# Patient Record
Sex: Male | Born: 1965
Health system: Southern US, Community
[De-identification: ages and names within clinical notes are randomized; demographics above are authoritative.]

## PROBLEM LIST (undated history)

## (undated) DIAGNOSIS — Z8739 Personal history of other diseases of the musculoskeletal system and connective tissue: Secondary | ICD-10-CM

## (undated) DIAGNOSIS — N289 Disorder of kidney and ureter, unspecified: Secondary | ICD-10-CM

## (undated) DIAGNOSIS — I10 Essential (primary) hypertension: Secondary | ICD-10-CM

## (undated) DIAGNOSIS — E119 Type 2 diabetes mellitus without complications: Secondary | ICD-10-CM

## (undated) DIAGNOSIS — R569 Unspecified convulsions: Secondary | ICD-10-CM

## (undated) DIAGNOSIS — D649 Anemia, unspecified: Secondary | ICD-10-CM

## (undated) DIAGNOSIS — Z940 Kidney transplant status: Secondary | ICD-10-CM

## (undated) HISTORY — PX: AV FISTULA PLACEMENT: SHX1204

## (undated) HISTORY — PX: NEPHRECTOMY TRANSPLANTED ORGAN: SUR880

---

## 1998-04-03 ENCOUNTER — Encounter (HOSPITAL_COMMUNITY): Admission: RE | Admit: 1998-04-03 | Discharge: 1998-07-02 | Payer: Self-pay

## 1999-09-25 ENCOUNTER — Encounter: Payer: Self-pay | Admitting: Nephrology

## 1999-09-25 ENCOUNTER — Encounter: Admission: RE | Admit: 1999-09-25 | Discharge: 1999-09-25 | Payer: Self-pay | Admitting: Nephrology

## 2001-09-24 ENCOUNTER — Inpatient Hospital Stay (HOSPITAL_COMMUNITY): Admission: EM | Admit: 2001-09-24 | Discharge: 2001-09-26 | Payer: Self-pay | Admitting: Emergency Medicine

## 2001-09-24 ENCOUNTER — Encounter: Payer: Self-pay | Admitting: Emergency Medicine

## 2003-03-26 ENCOUNTER — Encounter (HOSPITAL_COMMUNITY): Admission: RE | Admit: 2003-03-26 | Discharge: 2003-06-24 | Payer: Self-pay | Admitting: Surgery

## 2003-05-28 ENCOUNTER — Encounter (INDEPENDENT_AMBULATORY_CARE_PROVIDER_SITE_OTHER): Payer: Self-pay | Admitting: Specialist

## 2003-05-28 ENCOUNTER — Ambulatory Visit (HOSPITAL_COMMUNITY): Admission: RE | Admit: 2003-05-28 | Discharge: 2003-05-29 | Payer: Self-pay | Admitting: Surgery

## 2003-06-02 ENCOUNTER — Emergency Department (HOSPITAL_COMMUNITY): Admission: EM | Admit: 2003-06-02 | Discharge: 2003-06-02 | Payer: Self-pay | Admitting: Emergency Medicine

## 2003-11-03 ENCOUNTER — Emergency Department (HOSPITAL_COMMUNITY): Admission: EM | Admit: 2003-11-03 | Discharge: 2003-11-03 | Payer: Self-pay | Admitting: Family Medicine

## 2008-12-14 ENCOUNTER — Emergency Department (HOSPITAL_COMMUNITY): Admission: EM | Admit: 2008-12-14 | Discharge: 2008-12-14 | Payer: Self-pay | Admitting: Emergency Medicine

## 2010-01-09 ENCOUNTER — Inpatient Hospital Stay (HOSPITAL_COMMUNITY): Admission: EM | Admit: 2010-01-09 | Discharge: 2010-01-10 | Payer: Self-pay | Admitting: Emergency Medicine

## 2010-08-06 LAB — URINALYSIS, ROUTINE W REFLEX MICROSCOPIC
Glucose, UA: NEGATIVE mg/dL
Hgb urine dipstick: NEGATIVE
Ketones, ur: 15 mg/dL — AB
Leukocytes, UA: NEGATIVE
Nitrite: NEGATIVE
Protein, ur: 30 mg/dL — AB
Specific Gravity, Urine: 1.021 (ref 1.005–1.030)
Urobilinogen, UA: 0.2 mg/dL (ref 0.0–1.0)
pH: 5 (ref 5.0–8.0)

## 2010-08-06 LAB — URINE MICROSCOPIC-ADD ON

## 2010-08-06 LAB — GLUCOSE, CAPILLARY
Glucose-Capillary: 151 mg/dL — ABNORMAL HIGH (ref 70–99)
Glucose-Capillary: 198 mg/dL — ABNORMAL HIGH (ref 70–99)
Glucose-Capillary: 300 mg/dL — ABNORMAL HIGH (ref 70–99)

## 2010-08-06 LAB — HEMOGLOBIN A1C: Mean Plasma Glucose: 120 mg/dL — ABNORMAL HIGH (ref <117)

## 2010-08-06 LAB — COMPREHENSIVE METABOLIC PANEL
ALT: 26 U/L (ref 0–53)
AST: 22 U/L (ref 0–37)
Albumin: 3.8 g/dL (ref 3.5–5.2)
Alkaline Phosphatase: 65 U/L (ref 39–117)
BUN: 33 mg/dL — ABNORMAL HIGH (ref 6–23)
CO2: 20 mEq/L (ref 19–32)
Calcium: 8.1 mg/dL — ABNORMAL LOW (ref 8.4–10.5)
Chloride: 109 mEq/L (ref 96–112)
GFR calc Af Amer: 29 mL/min — ABNORMAL LOW (ref >60)
GFR calc non Af Amer: 24 mL/min — ABNORMAL LOW (ref >60)
Glucose, Bld: 132 mg/dL — ABNORMAL HIGH (ref 70–99)
Potassium: 3.9 mEq/L (ref 3.5–5.1)
Potassium: 4 mEq/L (ref 3.5–5.1)
Sodium: 134 mEq/L — ABNORMAL LOW (ref 135–145)
Total Bilirubin: 1.2 mg/dL (ref 0.3–1.2)
Total Protein: 7.7 g/dL (ref 6.0–8.3)

## 2010-08-06 LAB — RAPID URINE DRUG SCREEN, HOSP PERFORMED
Barbiturates: NOT DETECTED
Opiates: NOT DETECTED
Tetrahydrocannabinol: NOT DETECTED

## 2010-08-06 LAB — COMPREHENSIVE METABOLIC PANEL WITH GFR
Albumin: 4.5 g/dL (ref 3.5–5.2)
Alkaline Phosphatase: 75 U/L (ref 39–117)
BUN: 32 mg/dL — ABNORMAL HIGH (ref 6–23)
Chloride: 103 meq/L (ref 96–112)
Creatinine, Ser: 2.91 mg/dL — ABNORMAL HIGH (ref 0.4–1.5)
Glucose, Bld: 169 mg/dL — ABNORMAL HIGH (ref 70–99)
Total Bilirubin: 1.5 mg/dL — ABNORMAL HIGH (ref 0.3–1.2)

## 2010-08-06 LAB — DIFFERENTIAL
Basophils Absolute: 0 K/uL (ref 0.0–0.1)
Basophils Relative: 0 % (ref 0–1)
Eosinophils Absolute: 0.1 10*3/uL (ref 0.0–0.7)
Eosinophils Relative: 1 % (ref 0–5)
Lymphocytes Relative: 15 % (ref 12–46)
Lymphs Abs: 1.8 10*3/uL (ref 0.7–4.0)
Monocytes Absolute: 0.8 K/uL (ref 0.1–1.0)
Monocytes Relative: 6 % (ref 3–12)
Neutro Abs: 9 K/uL — ABNORMAL HIGH (ref 1.7–7.7)
Neutrophils Relative %: 78 % — ABNORMAL HIGH (ref 43–77)

## 2010-08-06 LAB — STOOL CULTURE

## 2010-08-06 LAB — CULTURE, BLOOD (ROUTINE X 2)
Culture: NO GROWTH
Culture: NO GROWTH

## 2010-08-06 LAB — CBC
HCT: 34.2 % — ABNORMAL LOW (ref 39.0–52.0)
HCT: 37.7 % — ABNORMAL LOW (ref 39.0–52.0)
Hemoglobin: 13.5 g/dL (ref 13.0–17.0)
MCH: 30.7 pg (ref 26.0–34.0)
MCHC: 35.8 g/dL (ref 30.0–36.0)
MCV: 84 fL (ref 78.0–100.0)
MCV: 85.7 fL (ref 78.0–100.0)
Platelets: 166 K/uL (ref 150–400)
Platelets: 168 10*3/uL (ref 150–400)
RBC: 4.07 MIL/uL — ABNORMAL LOW (ref 4.22–5.81)
RBC: 4.4 MIL/uL (ref 4.22–5.81)
RDW: 13.9 % (ref 11.5–15.5)
WBC: 11.7 10*3/uL — ABNORMAL HIGH (ref 4.0–10.5)
WBC: 8.4 10*3/uL (ref 4.0–10.5)

## 2010-08-06 LAB — PTH, INTACT AND CALCIUM
Calcium, Total (PTH): 8.3 mg/dL — ABNORMAL LOW (ref 8.4–10.5)
PTH: 58.4 pg/mL (ref 14.0–72.0)

## 2010-08-06 LAB — POCT CARDIAC MARKERS
CKMB, poc: <1 ng/mL — ABNORMAL LOW (ref 1.0–8.0)
Myoglobin, poc: 235 ng/mL (ref 12–200)

## 2010-08-06 LAB — SODIUM, URINE, RANDOM: Sodium, Ur: <10 mEq/L

## 2010-08-06 LAB — RENAL FUNCTION PANEL
CO2: 21 mEq/L (ref 19–32)
Chloride: 111 mEq/L (ref 96–112)
Creatinine, Ser: 1.15 mg/dL (ref 0.4–1.5)
GFR calc Af Amer: >60 mL/min (ref >60)
GFR calc non Af Amer: >60 mL/min (ref >60)
Potassium: 3.8 mEq/L (ref 3.5–5.1)

## 2010-08-06 LAB — CLOSTRIDIUM DIFFICILE EIA

## 2010-08-06 LAB — CARDIAC PANEL(CRET KIN+CKTOT+MB+TROPI)
CK, MB: 1.2 ng/mL (ref 0.3–4.0)
Relative Index: 1.1 (ref 0.0–2.5)
Total CK: 100 U/L (ref 7–232)
Total CK: 105 U/L (ref 7–232)

## 2010-08-06 LAB — TACROLIMUS LEVEL: Tacrolimus (FK506) - LabCorp: 9.5 ng/mL

## 2010-08-06 LAB — GIARDIA/CRYPTOSPORIDIUM SCREEN(EIA)

## 2010-08-06 LAB — TSH: TSH: 0.248 u[IU]/mL — ABNORMAL LOW (ref 0.350–4.500)

## 2010-08-06 LAB — LACTIC ACID, PLASMA: Lactic Acid, Venous: 1.9 mmol/L (ref 0.5–2.2)

## 2010-08-06 LAB — LIPASE, BLOOD: Lipase: 41 U/L (ref 11–59)

## 2010-10-09 NOTE — Op Note (Signed)
Austin Murphy, Austin Murphy                         ACCOUNT NO.:  [id number]   MEDICAL RECORD NO.:  [id number]                   PATIENT TYPE:  OIB   LOCATION:  2550                                 FACILITY:  [facility]   PHYSICIAN:  Earnstine Regal, M.D.                DATE OF BIRTH:  May 14, 1966   DATE OF PROCEDURE:  05/28/2003  DATE OF DISCHARGE:                                 OPERATIVE REPORT   PREOPERATIVE DIAGNOSIS:  Recurrent secondary hyperparathyroidism.   POSTOPERATIVE DIAGNOSIS:  Recurrent secondary hyperparathyroidism.   PROCEDURE:  1. Excise parathyroid tissue from left forearm.  2. Revise scar, left forearm.   SURGEON:  Earnstine Regal, M.D.   ANESTHESIA:  Local with IV sedation.   PREPARATION:  Betadine.   BLOOD LOSS:  Minimal.   COMPLICATIONS:  None.   INDICATIONS:  The patient is a 45 year old black male referred by Dr. Ollen Gross  C. Powell for recurrent secondary hyperparathyroidism.  The patient had  undergone total parathyroidectomy by Dr. Jaci Carrel in November of  1996.  At that time, four enlarged parathyroid glands were removed from the  neck and transplanted into the left forearm.  The patient subsequently  received cadaveric renal transplantation at [facility]  in 1998.  However, the patient has had persistently elevated serum calcium  levels and an elevated intact PTH level.  Sestamibi scan of the neck showed  no sign of ectopic or residual parathyroid tissue in the neck.  The patient  now comes to surgery for excision of parathyroid tissue from left forearm.   BODY OF REPORT:  The procedure is done in OR #1 at the [place].  The patient was brought to the operating room and placed  in supine position on the operating room table.  Following administration of  intravenous sedation, the patient's left forearm is prepped and draped in  the usual strict aseptic fashion.  After ascertaining an adequate level of  sedation had been obtained, the skin was anesthetized with local anesthetic.  A broad linear scar is present on the left forearm; this is excised  circumferentially and discarded.  Dissection was carried down through  subcutaneous tissues of the scar to the muscle fascia.  A fascial plane is  developed.  Prolene sutures remain in place in the muscle fascia,  delineating the location of the transplanted parathyroid tissue.  These  Prolene sutures are excised along with scar tissue and underlying  parathyroid discharge in a thorough exploration of the brachioradialis  muscle.  Both scar and obvious parathyroid tissue is excised.  There is no  grossly hyperplastic glands identified.  Tissue is submitted to Pathology  for review.  Hemostasis is obtained with the electrocautery.  Fascial defect  is too large to close over the muscle belly following the procedure.  Therefore, subcutaneous tissues  were closed with interrupted 3-0 Vicryl sutures.  The  skin was closed with a  running 4-0 Vicryl subcuticular suture.  The wound is washed, dried, and  Benzoin and Steri-Strips are applied.  Sterile gauze dressings are applied.  The patient is brought to the Recovery Room in stable condition.  The  patient tolerated the procedure well.                                               Earnstine Regal, M.D.    TMG/MEDQ  D:  05/28/2003  T:  05/28/2003  Job:  [id number]   cc:   Darrold Span. Florene Glen, M.D.  [address]  [place]  [place] [address]  Fax: [phone]

## 2010-10-09 NOTE — H&P (Signed)
[facility]  Patient:    Austin Murphy, Austin Murphy Visit Number: [id number] MRN: [id number]          Service Type: MED Location: [id number] Attending Physician:  Georgette Shell Dictated by:   Windy Kalata, M.D. Admit Date:  09/23/2001                           History and Physical  REASON FOR ADMISSION:  Cellulitis of the right arm and probable graft infection.  HISTORY OF PRESENT ILLNESS:  This is a 45 year old black male who is status post cadaveric renal transplant at [place] approximately five years ago, who complains of pain in his right arm over an old A-V graft for the last one to two weeks.  He apparently saw Dr. Judeth Cornfield. Scot Dock for this recently with plans for followup this Friday to determine if the graft should be removed.  Today, he noticed increasing pain and erythema over the old scar of his right upper arm and presented to the emergency room; in addition, he complains of some right chest pain, which he describes as crampy in nature, worse with inspiration, since Friday night.  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  He has had no shortness of breath.  PAST MEDICAL HISTORY: 1. End-stage renal disease secondary to hypertension. 2. Status post cadaveric renal transplant approximately five years ago. 3. Hypertension. 4. Secondary hyperparathyroidism, status post parathyroidectomy.  ALLERGIES:  None.  MEDICATIONS: 1. Prograf 8 mg b.i.d. 2. CellCept 1000 mg b.i.d. 3. Prednisone 5 mg a day. 4. Cardura 4 mg a day. 5. Norvasc 10 mg a day. 6. Lasix 80 mg b.i.d.  SOCIAL HISTORY:  Nonsmoker, nondrinker.  He is working part-time with a Development worker, international aid.  FAMILY HISTORY:  Noncontributory.  REVIEW OF SYSTEMS:  No recent change in vision.  No shortness of breath. Chest pain as mentioned above.  No dysuria.  No abdominal pain.  No new neurologic  symptoms.  No new skin lesions.  PHYSICAL EXAMINATION:  VITAL SIGNS:  Blood pressure is 122/80, temperature 101, pulse 84, respirations 16.  GENERAL:  A healthy-appearing 45 year old black male in no acute distress.  HEENT:  Sclerae nonicteric.  Extraocular muscles are intact.  NECK:  No JVD.  LUNGS:  A few crackles in the right base.  HEART:  Regular without murmur, rub or gallop.  ABDOMEN:  Positive bowel sounds.  Nontender, nondistended.  No hepatosplenomegaly.  Transplant in left lower quadrant which is nontender.  EXTREMITIES:  No clubbing, cyanosis, or edema.  CHEST:  I am able to reproduce the right chest pain with palpation on the lateral rib cage.  There are no external lesions or rashes.  NEUROLOGIC:  No focal deficits.  LABORATORY AND ACCESSORY DATA:  White count is 12.8, hemoglobin is 12.8. Potassium is 2.7, creatinine 1.6.  IMPRESSION: 1. Cellulitis of the right arm with probable graft infection. 2. Hypokalemia. 3. Right chest pain which seems more musculoskeletal in nature.  Chest x-ray    is pending at the present time. 4. Hypertension, currently well-controlled.  PLAN: 1. Will admit to the hospital and give IV antibiotics in the way of Ancef 1 g    q.8h. 2. Will check blood cultures. 3. Replace potassium. 4. Will have CVTS see to see if his graft needs to be removed. Dictated by:   Windy Kalata,  M.D. Attending Physician:  Georgette Shell DD:  09/24/01 TD:  09/25/01 Job: [id number]

## 2010-10-09 NOTE — Discharge Summary (Signed)
Austin Murphy. [facility]  Patient:    Murphy, Austin Visit Number: [id number] MRN: [id number]          Service Type: MED Location: [phone] Attending Physician:  Georgette Shell Dictated by:   Alric Seton, P.A.C. Admit Date:  09/23/2001 Discharge Date: 09/26/2001   CC:         Benson Setting, M.D.   Discharge Summary  DISCHARGE DIAGNOSES: 1. Cellulitis right arm, probably secondary to infected graft. 2. Clotted right AV Gore-Tex dialysis graft. 3. Renal transplant hypokalemia. 4. Hypertension. 5. Musculoskeletal pain.  CONSULTATIONS: Benson Setting, M.D.  HISTORY OF PRESENT ILLNESS: [name] is a 45 year old African-American male who is status post renal transplant at [place] approximately five years ago, who complains of pain in his right arm over his old AV Gore-tex graft site for the past two weeks. Apparently, he saw Dr. Deitra Mayo recently with plans for follow-up this coming Friday to determine if the graft should be removed. Today he noticed increasing pain and erythema over the old scar of his right upper arm and presented to the ER. In addition, he complains of some right sided chest pain, which he describes crampy in nature, worse with inspiration since Friday night. 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 He has no shortness of breath.  PHYSICAL EXAMINATION: The remainder of his physical examination is outlined in the admission H&P data.  LABORATORY DATA: White count 12.8, hemoglobin 12.8, potassium 2.7, creatinine 1.6.  HOSPITAL COURSE: The patient was admitted to a private room  on the renal unit due to his transplant status. Blood cultures were done times two and he was empirically placed on Ancef. Dr. Allean Found saw the patient in consultation and agreed with antibiotic treatment for now. If cellulitis worsened, he would consider removing  the graft. On day two, improvement was shown in his right arm. His potassium had been repleted and was up to 3.5. White count was down to 10.2 and temperature was down from 100.1 to 99.0. By day three, the arm continued to show more improvement. His white count was 9.4. CVTS recommended continued conservative therapy with follow-up in the office. He did have some right sided musculoskeletal type of chest pain which was treated with Darvocet. Hemoglobin remained stable throughout hospitalization at 11.9.  DISPOSITION: The patient was able to be discharged home on Sep 26, 2001. He will follow-up with Dr. Allean Found on Oct 06, 2001 at 9:15 and with Dr. Florene Glen at his usual appointment in June. He is to call us if he has any problems with worsening of his right arm swelling, erythema, or fevers.  DISCHARGE MEDICATIONS: 1. Keflex 500 mg one tablet three times daily for two more weeks. 2. Norvasc 10 mg per day. 3. Potassium chloride 40 meq per day. 4. Prograf 0.8 mg b.i.d. 5. Prednisone 5 mg per day. 6. Salsept 250 mg four tablets twice daily. 7. Cardura 4 mg per day. 8. Lasix 80 mg b.i.d. 9. Pepcid 20 mg per day. 10.Darvocet N-100 one tablet q.6h. p.r.n. musculoskeletal chest pain, #15 were    dispensed.  ACTIVITY: As tolerated.  DIET: No added salt.  WOUND CARE: Not applicable. Dictated by:   Alric Seton, P.A.C. Attending Physician:  Georgette Shell DD:  09/27/01 TD:  10/01/01 Job: [id number]

## 2014-07-09 ENCOUNTER — Encounter (HOSPITAL_COMMUNITY): Payer: Self-pay

## 2014-07-09 ENCOUNTER — Observation Stay (HOSPITAL_COMMUNITY)
Admission: EM | Admit: 2014-07-09 | Discharge: 2014-07-10 | Disposition: A | Discharge status: Home / Self Care | Payer: Medicare Other | Attending: Internal Medicine | Admitting: Internal Medicine

## 2014-07-09 DIAGNOSIS — N183 Chronic kidney disease, stage 3 (moderate): Secondary | ICD-10-CM

## 2014-07-09 DIAGNOSIS — N184 Chronic kidney disease, stage 4 (severe): Secondary | ICD-10-CM | POA: Diagnosis present

## 2014-07-09 DIAGNOSIS — E119 Type 2 diabetes mellitus without complications: Secondary | ICD-10-CM | POA: Diagnosis not present

## 2014-07-09 DIAGNOSIS — I951 Orthostatic hypotension: Principal | ICD-10-CM | POA: Diagnosis present

## 2014-07-09 DIAGNOSIS — R55 Syncope and collapse: Secondary | ICD-10-CM | POA: Diagnosis present

## 2014-07-09 DIAGNOSIS — Z87891 Personal history of nicotine dependence: Secondary | ICD-10-CM | POA: Insufficient documentation

## 2014-07-09 DIAGNOSIS — Z7952 Long term (current) use of systemic steroids: Secondary | ICD-10-CM | POA: Diagnosis not present

## 2014-07-09 DIAGNOSIS — Z79899 Other long term (current) drug therapy: Secondary | ICD-10-CM | POA: Insufficient documentation

## 2014-07-09 DIAGNOSIS — I10 Essential (primary) hypertension: Secondary | ICD-10-CM | POA: Diagnosis present

## 2014-07-09 DIAGNOSIS — Z87448 Personal history of other diseases of urinary system: Secondary | ICD-10-CM | POA: Diagnosis not present

## 2014-07-09 DIAGNOSIS — Z940 Kidney transplant status: Secondary | ICD-10-CM

## 2014-07-09 DIAGNOSIS — IMO0002 Reserved for concepts with insufficient information to code with codable children: Secondary | ICD-10-CM

## 2014-07-09 DIAGNOSIS — E1129 Type 2 diabetes mellitus with other diabetic kidney complication: Secondary | ICD-10-CM

## 2014-07-09 DIAGNOSIS — E1165 Type 2 diabetes mellitus with hyperglycemia: Secondary | ICD-10-CM

## 2014-07-09 HISTORY — DX: Unspecified convulsions: R56.9

## 2014-07-09 HISTORY — DX: Disorder of kidney and ureter, unspecified: N28.9

## 2014-07-09 HISTORY — DX: Type 2 diabetes mellitus without complications: E11.9

## 2014-07-09 HISTORY — DX: Essential (primary) hypertension: I10

## 2014-07-09 LAB — URINALYSIS, ROUTINE W REFLEX MICROSCOPIC
BILIRUBIN URINE: NEGATIVE
Glucose, UA: NEGATIVE mg/dL
HGB URINE DIPSTICK: NEGATIVE
Ketones, ur: NEGATIVE mg/dL
Leukocytes, UA: NEGATIVE
NITRITE: NEGATIVE
PH: 5 (ref 5.0–8.0)
Protein, ur: NEGATIVE mg/dL
SPECIFIC GRAVITY, URINE: 1.008 (ref 1.005–1.030)
UROBILINOGEN UA: 0.2 mg/dL (ref 0.0–1.0)

## 2014-07-09 LAB — COMPREHENSIVE METABOLIC PANEL
ALBUMIN: 4.3 g/dL (ref 3.5–5.2)
ALT: 12 U/L (ref 0–53)
ANION GAP: 8 (ref 5–15)
AST: 15 U/L (ref 0–37)
Alkaline Phosphatase: 78 U/L (ref 39–117)
BUN: 25 mg/dL — ABNORMAL HIGH (ref 6–23)
CALCIUM: 8.3 mg/dL — AB (ref 8.4–10.5)
CHLORIDE: 106 mmol/L (ref 96–112)
CO2: 21 mmol/L (ref 19–32)
Creatinine, Ser: 2.05 mg/dL — ABNORMAL HIGH (ref 0.50–1.35)
GFR calc Af Amer: 42 mL/min — ABNORMAL LOW (ref >90)
GFR calc non Af Amer: 36 mL/min — ABNORMAL LOW (ref >90)
Glucose, Bld: 172 mg/dL — ABNORMAL HIGH (ref 70–99)
POTASSIUM: 4 mmol/L (ref 3.5–5.1)
Sodium: 135 mmol/L (ref 135–145)
TOTAL PROTEIN: 7.2 g/dL (ref 6.0–8.3)
Total Bilirubin: 0.9 mg/dL (ref 0.3–1.2)

## 2014-07-09 LAB — CBC WITH DIFFERENTIAL/PLATELET
BASOS PCT: 0 % (ref 0–1)
Basophils Absolute: 0 10*3/uL (ref 0.0–0.1)
EOS ABS: 0.1 10*3/uL (ref 0.0–0.7)
Eosinophils Relative: 3 % (ref 0–5)
HEMATOCRIT: 33.4 % — AB (ref 39.0–52.0)
Hemoglobin: 11.6 g/dL — ABNORMAL LOW (ref 13.0–17.0)
LYMPHS ABS: 1.4 10*3/uL (ref 0.7–4.0)
LYMPHS PCT: 24 % (ref 12–46)
MCH: 27.9 pg (ref 26.0–34.0)
MCHC: 34.7 g/dL (ref 30.0–36.0)
MCV: 80.3 fL (ref 78.0–100.0)
MONO ABS: 0.3 10*3/uL (ref 0.1–1.0)
MONOS PCT: 6 % (ref 3–12)
Neutro Abs: 3.8 10*3/uL (ref 1.7–7.7)
Neutrophils Relative %: 67 % (ref 43–77)
Platelets: 184 10*3/uL (ref 150–400)
RBC: 4.16 MIL/uL — AB (ref 4.22–5.81)
RDW: 14 % (ref 11.5–15.5)
WBC: 5.7 10*3/uL (ref 4.0–10.5)

## 2014-07-09 LAB — TROPONIN I: Troponin I: <0.03 ng/mL (ref <0.031)

## 2014-07-09 MED ORDER — FAMOTIDINE 20 MG PO TABS
20.0000 mg | ORAL_TABLET | Freq: Every day | ORAL | Status: DC
  Administered 2014-07-09: 20 mg via ORAL
  Filled 2014-07-09: qty 1

## 2014-07-09 MED ORDER — ACETAMINOPHEN 325 MG PO TABS
650.0000 mg | ORAL_TABLET | Freq: Four times a day (QID) | ORAL | Status: DC | PRN
  Administered 2014-07-10: 650 mg via ORAL
  Filled 2014-07-09: qty 2

## 2014-07-09 MED ORDER — MYCOPHENOLATE MOFETIL 250 MG PO CAPS
1000.0000 mg | ORAL_CAPSULE | Freq: Two times a day (BID) | ORAL | Status: DC
Start: 2014-07-09 — End: 2014-07-10
  Administered 2014-07-09 – 2014-07-10 (×2): 1000 mg via ORAL
  Filled 2014-07-09 (×3): qty 4

## 2014-07-09 MED ORDER — HEPARIN SODIUM (PORCINE) 5000 UNIT/ML IJ SOLN
5000.0000 [IU] | Freq: Three times a day (TID) | INTRAMUSCULAR | Status: DC
  Administered 2014-07-09 – 2014-07-10 (×2): 5000 [IU] via SUBCUTANEOUS
  Filled 2014-07-09 (×2): qty 1

## 2014-07-09 MED ORDER — PREDNISONE 5 MG PO TABS
5.0000 mg | ORAL_TABLET | Freq: Every day | ORAL | Status: DC
  Administered 2014-07-10: 5 mg via ORAL
  Filled 2014-07-09: qty 1

## 2014-07-09 MED ORDER — TACROLIMUS 1 MG PO CAPS
3.0000 mg | ORAL_CAPSULE | Freq: Two times a day (BID) | ORAL | Status: DC
  Administered 2014-07-09 – 2014-07-10 (×2): 3 mg via ORAL
  Filled 2014-07-09 (×3): qty 3

## 2014-07-09 MED ORDER — PREDNISONE (PAK) 5 MG PO TABS: 5.0000 mg | ORAL_TABLET | Freq: Every day | ORAL | Status: DC

## 2014-07-09 MED ORDER — ACETAMINOPHEN 650 MG RE SUPP: 650.0000 mg | Freq: Four times a day (QID) | RECTAL | Status: DC | PRN

## 2014-07-09 MED ORDER — SODIUM CHLORIDE 0.9 % IJ SOLN: 3.0000 mL | Freq: Two times a day (BID) | INTRAMUSCULAR | Status: DC

## 2014-07-09 MED ORDER — SODIUM CHLORIDE 0.9 % IV SOLN
INTRAVENOUS | Status: DC
  Administered 2014-07-09 – 2014-07-10 (×2): via INTRAVENOUS

## 2014-07-09 MED ORDER — SODIUM CHLORIDE 0.9 % IV SOLN: INTRAVENOUS | Status: DC

## 2014-07-09 MED ORDER — SODIUM CHLORIDE 0.9 % IV BOLUS (SEPSIS)
500.0000 mL | Freq: Once | INTRAVENOUS | Status: AC
  Administered 2014-07-09: 500 mL via INTRAVENOUS

## 2014-07-09 MED ORDER — TACROLIMUS 1 MG PO CAPS
5.0000 mg | ORAL_CAPSULE | Freq: Two times a day (BID) | ORAL | Status: DC
  Administered 2014-07-09 – 2014-07-10 (×2): 5 mg via ORAL
  Filled 2014-07-09 (×3): qty 5

## 2014-07-09 NOTE — ED Notes (Signed)
Pt reports he was walking when he became dizzy/lightheaded and diaphoretic, then passed out and fell onto floor. States he did not hit head. No nausea/vomiting. Pt is alert and oriented x4 upon arrival to ED. No neurological deficits noted. Denies pain at present.

## 2014-07-09 NOTE — ED Provider Notes (Signed)
CSN: [id number]     Arrival date & time 07/09/14  0810 History   First MD Initiated Contact with Patient 07/09/14 0818     Chief Complaint  Patient presents with  . Loss of Consciousness     (Consider location/radiation/quality/duration/timing/severity/associated sxs/prior Treatment) HPI The patient reports he had gotten up at about 6 AM and taken his medications. He reports he had his breakfast and felt fine. He reports about an hour later he felt lightheaded and got off the couch to go to the bathroom. He reports he then got sweaty and weak and collapsed to the ground. He denies that he had complete loss of consciousness. He denies that he struck his head. He denies he had any associated chest pain or shortness of breath. He reports within a very short period of time he was able to get back up and the symptoms had resolved. He thinks it may have been due to a medication. He was restarted on a medication that he had previously not been on for a while. At this time he cannot recall the name. He has no complaints at this time and he states he feels well. He reports he's had no symptoms leading up to this and has had regular  appointments with his doctor and no recent complications with his medical problems. The patient has a history of renal transplant and has had successful transplant for approximately 15 years. Past Medical History  Diagnosis Date  . Renal disorder   . Diabetes mellitus without complication   . Hypertension   . Seizures    Past Surgical History  Procedure Laterality Date  . Nephrectomy transplanted organ     No family history on file. History  Substance Use Topics  . Smoking status: Former Smoker    Quit date: 05/24/2004  . Smokeless tobacco: Not on file  . Alcohol Use: No    Review of Systems 10 Systems reviewed and are negative for acute change except as noted in the HPI.    Allergies  Review of patient's allergies indicates no known allergies.  Home  Medications   Prior to Admission medications   Medication Sig Start Date End Date Taking? Authorizing Provider  amLODipine (NORVASC) 10 MG tablet Take 10 mg by mouth daily.   Yes Historical Provider, MD  atenolol (TENORMIN) 100 MG tablet Take 100 mg by mouth daily.   Yes Historical Provider, MD  cloNIDine (CATAPRES) 0.1 MG tablet Take 0.1 mg by mouth at bedtime.   Yes Historical Provider, MD  doxazosin (CARDURA) 8 MG tablet Take 8 mg by mouth daily.   Yes Historical Provider, MD  famotidine (PEPCID) 20 MG tablet Take 20 mg by mouth at bedtime.   Yes Historical Provider, MD  furosemide (LASIX) 80 MG tablet Take 80 mg by mouth 2 (two) times daily.   Yes Historical Provider, MD  metFORMIN (GLUCOPHAGE) 500 MG tablet Take 500 mg by mouth 2 (two) times daily with a meal.   Yes Historical Provider, MD  mycophenolate (CELLCEPT) 250 MG capsule Take 1,000 mg by mouth 2 (two) times daily.   Yes Historical Provider, MD  predniSONE (STERAPRED UNI-PAK) 5 MG TABS tablet Take 5 mg by mouth daily.   Yes Historical Provider, MD  tacrolimus (PROGRAF) 1 MG capsule Take 3 mg by mouth every 12 (twelve) hours.   Yes Historical Provider, MD  tacrolimus (PROGRAF) 5 MG capsule Take 5 mg by mouth.   Yes Historical Provider, MD   BP 143/82 mmHg  Pulse  52  Temp(Src) 97.5 F (36.4 C) (Oral)  Resp 14  SpO2 100% Physical Exam  Constitutional: He is oriented to person, place, and time. He appears well-developed and well-nourished.  HENT:  Head: Normocephalic and atraumatic.  Eyes: EOM are normal. Pupils are equal, round, and reactive to light.  Neck: Neck supple.  Cardiovascular: Normal rate, regular rhythm, normal heart sounds and intact distal pulses.   Pulmonary/Chest: Effort normal and breath sounds normal.  Abdominal: Soft. Bowel sounds are normal. He exhibits no distension. There is no tenderness.  Musculoskeletal: Normal range of motion. He exhibits no edema.  Neurological: He is alert and oriented to person,  place, and time. He has normal strength. Coordination normal. GCS eye subscore is 4. GCS verbal subscore is 5. GCS motor subscore is 6.  Skin: Skin is warm, dry and intact.  Psychiatric: He has a normal mood and affect.    ED Course  Procedures (including critical care time) Labs Review Labs Reviewed  COMPREHENSIVE METABOLIC PANEL - Abnormal; Notable for the following:    Glucose, Bld 172 (*)    BUN 25 (*)    Creatinine, Ser 2.05 (*)    Calcium 8.3 (*)    GFR calc non Af Amer 36 (*)    GFR calc Af Amer 42 (*)    All other components within normal limits  CBC WITH DIFFERENTIAL/PLATELET - Abnormal; Notable for the following:    RBC 4.16 (*)    Hemoglobin 11.6 (*)    HCT 33.4 (*)    All other components within normal limits  TROPONIN I  URINALYSIS, ROUTINE W REFLEX MICROSCOPIC    Imaging Review No results found.   EKG Interpretation   Date/Time:  Tuesday July 09 2014 08:18:41 EST Ventricular Rate:  54 PR Interval:  200 QRS Duration: 88 QT Interval:  452 QTC Calculation: 428 R Axis:   21 Text Interpretation:  Sinus rhythm RSR' in V1 or V2, right VCD or RVH  Minimal ST elevation, anterolateral leads agree. no change from old  Confirmed by Johnney Killian, MD, Jeannie Done [phone]) on 07/09/2014 8:53:55 AM      MDM   Final diagnoses:  Near syncope  Orthostatic hypotension   Patient was positive for orthostatic hypotension in the emergency department. Consideration is given to possible medication reaction. Patient was rechecked and persisted with symptomatic hypotension. Fluid hydration has been initiated in smaller aliquots of 500 mL. In conjunction with the patient's complex medical history of renal transplant and cardiac risk factors, the patient will be placed in observation for continued evaluation.    Charlesetta Shanks, MD 07/09/14 [phone]

## 2014-07-09 NOTE — ED Notes (Signed)
Unable to establish IV access at the time. Second RN to attempt.

## 2014-07-09 NOTE — ED Notes (Signed)
Per [facility]: pt. Got up this AM and went to BR. Pt. Felt lightheaded/dizzy/diaphoretic. Syncopal episode with positive LOC. Denies hitting head. Denies CP/SOB. Pt. Able to ambulate independently back to couch prior to EMS arrival. CBG 249. Initial HR 46. Pt. Denies pain at this time.

## 2014-07-09 NOTE — H&P (Signed)
[facility] History and Physical  Austin Murphy [id number] DOB: 06/21/1965 DOA: 07/09/2014  Referring physician: Dr. Vallery Ridge  PCP: Dr Erling Cruz  Chief Complaint:  Near syncope   HPI:  49 year old male with history of renal transplant about 15 years back at [facility], uncontrolled hypertension, type 2 diabetes mellitus, hypertension, remote history of seizures, some are chronic kidney disease who resented to the ED with a near-syncopal episode this morning. Patient reports him in out of the bathroom and feeling very hot . He reports slumping on the ground feeling like he was going to pass out. She however did not lose consciousness and got up within few seconds but felt very hot and sweaty. He reports being lightheaded and dizzy. He denies any chest pain, palpitations, shortness of breath, nausea, vomiting, fever, chills, bowel or urinary symptoms. He denies similar symptoms in the past.  His brother called the ambulance and patient was brought to the ED. He reports being compliant with his medications. Reports being started on a new medication 1 week back when he saw Dr. Florene Glen. On verifying with his pharmacy he was started on clonidine 0.1 mg daily at bedtime.  Course in the ED. Patient was bradycardic down to 47, afebrile and significantly orthostatic with blood pressure dropping down from 135/79 to 114/81 on sitting up and 87/52 on standing up. His heart rate was also in the range of 47-54.  Blood work done showed old basal 5.7, hemoglobin 11.6, platelets 184. Chemistry showed sodium of 135, K of 4, chloride 106, CO2 21, BUN of 25 and 2.05, blood glucose of 172. Initial troponin was negative. Hospitalists admission requested to telemetry on observation .   Review of Systems:  Constitutional: Diaphoresis ++, Denies fever, chills,  appetite change and fatigue.  HEENT: Denies photophobia, eye pain,  hearing loss, ear pain, congestion, trouble swallowing, neck pain,     Respiratory: Denies SOB, DOE, cough, chest tightness,  and wheezing.   Cardiovascular: Denies chest pain, palpitations and leg swelling.  Gastrointestinal: Denies nausea, vomiting, abdominal pain, diarrhea, blood in stool and abdominal distention.  Genitourinary: Denies dysuria, urgency, frequency, hematuria, flank pain and difficulty urinating.  Endocrine: Denies: hot or cold intolerance,  polyuria, polydipsia. Musculoskeletal: Denies myalgias, back pain, joint pain  Skin: Denies pallor, rash and wound.  Neurological: near-syncope, lightheadedness, dizziness.  Denies seizures, syncope, weakness,  numbness and headaches.  Psychiatric/Behavioral: Denies confusion,   Past Medical History  Diagnosis Date  . Renal disorder   . Diabetes mellitus without complication   . Hypertension   . Seizures    Past Surgical History  Procedure Laterality Date  . Nephrectomy transplanted organ     Social History:  reports that he quit smoking about 10 years ago. He does not have any smokeless tobacco history on file. He reports that he does not drink alcohol or use illicit drugs.  No Known Allergies  No family history on file.  Prior to Admission medications   Medication Sig Start Date End Date Taking? Authorizing Provider  amLODipine (NORVASC) 10 MG tablet Take 10 mg by mouth daily.   Yes Historical Provider, MD  atenolol (TENORMIN) 100 MG tablet Take 100 mg by mouth daily.   Yes Historical Provider, MD  cloNIDine (CATAPRES) 0.1 MG tablet Take 0.1 mg by mouth at bedtime.   Yes Historical Provider, MD  doxazosin (CARDURA) 8 MG tablet Take 8 mg by mouth daily.   Yes Historical Provider, MD  famotidine (PEPCID) 20 MG tablet Take 20 mg by  mouth at bedtime.   Yes Historical Provider, MD  furosemide (LASIX) 80 MG tablet Take 80 mg by mouth 2 (two) times daily.   Yes Historical Provider, MD  metFORMIN (GLUCOPHAGE) 500 MG tablet Take 500 mg by mouth 2 (two) times daily with a meal.   Yes Historical  Provider, MD  mycophenolate (CELLCEPT) 250 MG capsule Take 1,000 mg by mouth 2 (two) times daily.   Yes Historical Provider, MD  predniSONE (STERAPRED UNI-PAK) 5 MG TABS tablet Take 5 mg by mouth daily.   Yes Historical Provider, MD  tacrolimus (PROGRAF) 1 MG capsule Take 3 mg by mouth every 12 (twelve) hours.   Yes Historical Provider, MD  tacrolimus (PROGRAF) 5 MG capsule Take 5 mg by mouth.   Yes Historical Provider, MD     Physical Exam:  Filed Vitals:   07/09/14 1330 07/09/14 1400 07/09/14 1415 07/09/14 1430  BP: 134/78 136/77 141/78 128/80  Pulse: 47 48 50 54  Temp:      TempSrc:      Resp: 14 11 15 14   SpO2: 100% 99% 99% 99%    Constitutional: Vital signs reviewed.  Middle aged male in no acute distress  HEENT: no pallor, no icterus, moist oral mucosa, no cervical lymphadenopathy, neck supple Cardiovascular: RRR, S1 normal, S2 normal, no MRG Chest: CTAB, no wheezes, rales, or rhonchi Abdominal: Soft. Non-tender, non-distended, bowel sounds are normal, Ext: warm, no edema Neurological: A&O x3, non focal  Labs on Admission:  Basic Metabolic Panel:  Recent Labs Lab 07/09/14 0900  NA 135  K 4.0  CL 106  CO2 21  GLUCOSE 172*  BUN 25*  CREATININE 2.05*  CALCIUM 8.3*   Liver Function Tests:  Recent Labs Lab 07/09/14 0900  AST 15  ALT 12  ALKPHOS 78  BILITOT 0.9  PROT 7.2  ALBUMIN 4.3   No results for input(s): LIPASE, AMYLASE in the last 168 hours. No results for input(s): AMMONIA in the last 168 hours. CBC:  Recent Labs Lab 07/09/14 0900  WBC 5.7  NEUTROABS 3.8  HGB 11.6*  HCT 33.4*  MCV 80.3  PLT 184   Cardiac Enzymes:  Recent Labs Lab 07/09/14 0900  TROPONINI <0.03   BNP: Invalid input(s): POCBNP CBG: No results for input(s): GLUCAP in the last 168 hours.  Radiological Exams on Admission: No results found.  EKG: Sinus bradycardia at 52, no ST-T changes  Assessment/Plan Principal Problem:   Near syncope Likely related to  orthostatic hypotension. Patient on multiple blood pressure medications. He is on 5 different blood pressure medications with clonidine prescribed recently. I will hold all his home medications and hydrate him with IV fluids. -Monitor on telemetry. -Patient also bradycardic in low 50s on presentation. Will check 2D echo.  Active Problems:   Orthostatic hypotension As outlined above. Hold all blood pressure medications and monitor with IV fluids. Reintroduce BP meds slowly.    Renal transplant recipient Resume mycophenolate and tacrolimus. Follows with Dr. Florene Glen as outpatient    Uncontrolled hypertension On multiple blood pressure medications now presenting with orthostatic near-syncope.    Chronic kidney disease Hx of transplant kidney. No recent baseline renal fn in the system.  Diabetes mellitus Hold metformin. Check A1C, monitor fsg on sliding scale insulin.  Diet: Low-sodium  DVT prophylaxis: sq heparin   Code Status: full code Family Communication:  None at bedside Disposition Plan: Admit under observation to telemetry. Home possibly tomorrow if symptoms resolved.  Louellen Molder [facility] Pager [phone]  Total  time spent on admission :50 minutes  If 7PM-7AM, please contact night-coverage [email or web address] Password [facility] 07/09/2014, 2:35 PM

## 2014-07-10 DIAGNOSIS — R55 Syncope and collapse: Secondary | ICD-10-CM

## 2014-07-10 DIAGNOSIS — Z940 Kidney transplant status: Secondary | ICD-10-CM | POA: Diagnosis not present

## 2014-07-10 DIAGNOSIS — N189 Chronic kidney disease, unspecified: Secondary | ICD-10-CM | POA: Diagnosis not present

## 2014-07-10 DIAGNOSIS — I951 Orthostatic hypotension: Secondary | ICD-10-CM | POA: Diagnosis not present

## 2014-07-10 LAB — BASIC METABOLIC PANEL
Anion gap: 5 (ref 5–15)
BUN: 23 mg/dL (ref 6–23)
CALCIUM: 7.8 mg/dL — AB (ref 8.4–10.5)
CO2: 25 mmol/L (ref 19–32)
Chloride: 109 mmol/L (ref 96–112)
Creatinine, Ser: 1.91 mg/dL — ABNORMAL HIGH (ref 0.50–1.35)
GFR calc Af Amer: 46 mL/min — ABNORMAL LOW (ref >90)
GFR calc non Af Amer: 40 mL/min — ABNORMAL LOW (ref >90)
GLUCOSE: 152 mg/dL — AB (ref 70–99)
Potassium: 3.3 mmol/L — ABNORMAL LOW (ref 3.5–5.1)
SODIUM: 139 mmol/L (ref 135–145)

## 2014-07-10 LAB — HEMOGLOBIN A1C
HEMOGLOBIN A1C: 6.6 % — AB (ref 4.8–5.6)
MEAN PLASMA GLUCOSE: 143 mg/dL

## 2014-07-10 MED ORDER — POTASSIUM CHLORIDE CRYS ER 20 MEQ PO TBCR
40.0000 meq | EXTENDED_RELEASE_TABLET | Freq: Once | ORAL | Status: AC
  Administered 2014-07-10: 40 meq via ORAL
  Filled 2014-07-10: qty 2

## 2014-07-10 MED ORDER — FUROSEMIDE 80 MG PO TABS: 80.0000 mg | ORAL_TABLET | Freq: Every day | ORAL | Status: DC

## 2014-07-10 MED ORDER — TAMSULOSIN HCL 0.4 MG PO CAPS: 0.4000 mg | ORAL_CAPSULE | Freq: Every day | ORAL | Status: DC

## 2014-07-10 MED ORDER — ATENOLOL 100 MG PO TABS: 50.0000 mg | ORAL_TABLET | Freq: Every day | ORAL | Status: DC

## 2014-07-10 NOTE — Discharge Summary (Addendum)
Discharge Summary  Austin Murphy [id number] DOB: 11-03-1965  PCP: Austin Emms, MD  Admit date: 07/09/2014 Discharge date: 07/10/2014  Time spent: less than 24mins  Recommendations for Outpatient Follow-up:  1. Patient to establish pcp for bp and diabetes control 2. F/u with cardiology for syncope 3. F/u with nephrology for further diuretic dose adjustment  Discharge Diagnoses:  Active Hospital Problems   Diagnosis Date Noted  . Near syncope 07/09/2014  . Renal transplant recipient 07/09/2014  . Uncontrolled hypertension 07/09/2014  . Orthostatic hypotension 07/09/2014  . Chronic kidney disease 07/09/2014  . Diabetes mellitus, type 2 07/09/2014    Resolved Hospital Problems   Diagnosis Date Noted Date Resolved  No resolved problems to display.    Discharge Condition: stable  Diet recommendation: heart health/carb mondified  Filed Weights   07/09/14 1515  Weight: 88.361 kg (194 lb 12.8 oz)    History of present illness:  49 year old male with history of renal transplant about 15 years back at [facility], uncontrolled hypertension, type 2 diabetes mellitus, hypertension, remote history of seizures, some are chronic kidney disease who resented to the ED with a near-syncopal episode this morning. Patient reports him in out of the bathroom and feeling very hot . He reports slumping on the ground feeling like he was going to pass out. She however did not lose consciousness and got up within few seconds but felt very hot and sweaty. He reports being lightheaded and dizzy. He denies any chest pain, palpitations, shortness of breath, nausea, vomiting, fever, chills, bowel or urinary symptoms. He denies similar symptoms in the past.  His brother called the ambulance and patient was brought to the ED. He reports being compliant with his medications. Reports being started on a new medication 1 week back when he saw Dr. Florene Murphy. On verifying with his pharmacy he was started on  clonidine 0.1 mg daily at bedtime.  Course in the ED. Patient was bradycardic down to 47, afebrile and significantly orthostatic with blood pressure dropping down from 135/79 to 114/81 on sitting up and 87/52 on standing up. His heart rate was also in the range of 47-54.  Blood work done showed old basal 5.7, hemoglobin 11.6, platelets 184. Chemistry showed sodium of 135, K of 4, chloride 106, CO2 21, BUN of 25 and 2.05, blood glucose of 172. Initial troponin was negative. Hospitalists admission requested to telemetry on observation   Hospital Course:  Principal Problem:   Near syncope Active Problems:   Renal transplant recipient   Uncontrolled hypertension   Orthostatic hypotension   Chronic kidney disease   Diabetes mellitus, type 2  Orthostatic hypotension As outlined above.  all blood pressure medications held during hospitalization and received IV fluids. Reintroduce BP meds slowly. At time of discharge, clonidine/cardura discontinued. Atenolol dose decreased, patient has borderline bradycardia. Lasix dose decreased to 80mg  po qd, norvasc continued. Echo no wall motion abnormality, does has diastolic dysfunction, outpatient cards follow up.   Renal transplant recipient Resume mycophenolate and tacrolimus. Follows with Dr. Florene Murphy as outpatient   Uncontrolled hypertension On multiple blood pressure medications now presenting with orthostatic near-syncope. bp meds adjusted as mentioned above, patient to f/u with pmd and nephrology for further bp meds adjustment.   Chronic kidney disease Hx of transplant kidney. No recent baseline renal fn in the system. D/c metformin, avoid renal offensive agents. Cr 2.9 on admission, 1.9 at discharge.  Diabetes mellitus stop metformin (cr 1.9). A1C 6.6, monitor fsg on sliding scale insulin. Carb modified diet. pcp  to continue monitor blood sugar.   BPH cardura stopped, due to concerning affect on bp, flomax  started.  Procedures:  echo  Consultations:  none  Discharge Exam: BP 161/91 mmHg  Pulse 52  Temp(Src) 98.1 F (36.7 C) (Oral)  Resp 19  Ht 6\' 2"  (1.88 m)  Wt 88.361 kg (194 lb 12.8 oz)  BMI 25.00 kg/m2  SpO2 98%  Constitutional: Vital signs reviewed. Middle aged male in no acute distress  HEENT: no pallor, no icterus, moist oral mucosa, no cervical lymphadenopathy, neck supple Cardiovascular: RRR, S1 normal, S2 normal, no MRG Chest: CTAB, no wheezes, rales, or rhonchi Abdominal: Soft. Non-tender, non-distended, bowel sounds are normal, Ext: warm, no edema Neurological: A&O x3, non focal   Discharge Instructions You were cared for by a hospitalist during your hospital stay. If you have any questions about your discharge medications or the care you received while you were in the hospital after you are discharged, you can call the unit and asked to speak with the hospitalist on call if the hospitalist that took care of you is not available. Once you are discharged, your primary care physician will handle any further medical issues. Please note that NO REFILLS for any discharge medications will be authorized once you are discharged, as it is imperative that you return to your primary care physician (or establish a relationship with a primary care physician if you do not have one) for your aftercare needs so that they can reassess your need for medications and monitor your lab values.  Discharge Instructions    Diet - low sodium heart healthy    Complete by:  As directed      Increase activity slowly    Complete by:  As directed             Medication List    STOP taking these medications        cloNIDine 0.1 MG tablet  Commonly known as:  CATAPRES     doxazosin 8 MG tablet  Commonly known as:  CARDURA     metFORMIN 500 MG tablet  Commonly known as:  GLUCOPHAGE      TAKE these medications        amLODipine 10 MG tablet  Commonly known as:  NORVASC  Take 10 mg  by mouth daily.     atenolol 100 MG tablet  Commonly known as:  TENORMIN  Take 0.5 tablets (50 mg total) by mouth daily.     famotidine 20 MG tablet  Commonly known as:  PEPCID  Take 20 mg by mouth at bedtime.     furosemide 80 MG tablet  Commonly known as:  LASIX  Take 1 tablet (80 mg total) by mouth daily.     mycophenolate 250 MG capsule  Commonly known as:  CELLCEPT  Take 1,000 mg by mouth 2 (two) times daily.     predniSONE 5 MG tablet  Commonly known as:  DELTASONE  Take 5 mg by mouth daily.     tacrolimus 1 MG capsule  Commonly known as:  PROGRAF  Take 3 mg by mouth 2 (two) times daily.     tacrolimus 5 MG capsule  Commonly known as:  PROGRAF  Take 5 mg by mouth 2 (two) times daily.     tamsulosin 0.4 MG Caps capsule  Commonly known as:  FLOMAX  Take 1 capsule (0.4 mg total) by mouth daily.       No Known Allergies  Follow-up Information    Follow up with [facility], MD In 1 week.   Specialty:  Nephrology   Contact information:   [place] [address] [phone]       Follow up with Candee Furbish, MD In 1 week.   Specialty:  Cardiology   Contact information:   [id number] [place] [address] [phone]        The results of significant diagnostics from this hospitalization (including imaging, microbiology, ancillary and laboratory) are listed below for reference.    Significant Diagnostic Studies: No results found.  Microbiology: No results found for this or any previous visit (from the past 240 hour(s)).   Labs: Basic Metabolic Panel:  Recent Labs Lab 07/09/14 0900 07/10/14 0450  NA 135 139  K 4.0 3.3*  CL 106 109  CO2 21 25  GLUCOSE 172* 152*  BUN 25* 23  CREATININE 2.05* 1.91*  CALCIUM 8.3* 7.8*   Liver Function Tests:  Recent Labs Lab 07/09/14 0900  AST 15  ALT 12  ALKPHOS 78  BILITOT 0.9  PROT 7.2  ALBUMIN 4.3   No results for input(s):  LIPASE, AMYLASE in the last 168 hours. No results for input(s): AMMONIA in the last 168 hours. CBC:  Recent Labs Lab 07/09/14 0900  WBC 5.7  NEUTROABS 3.8  HGB 11.6*  HCT 33.4*  MCV 80.3  PLT 184   Cardiac Enzymes:  Recent Labs Lab 07/09/14 0900  TROPONINI <0.03   BNP: BNP (last 3 results) No results for input(s): BNP in the last 8760 hours.  ProBNP (last 3 results) No results for input(s): PROBNP in the last 8760 hours.  CBG: No results for input(s): GLUCAP in the last 168 hours.     Signed:  Treylon Henard  [facility] 07/10/2014, 1:34 PM

## 2014-07-10 NOTE — Progress Notes (Signed)
  Echocardiogram 2D Echocardiogram has been performed.  Lysle Rubens 07/10/2014, 9:48 AM

## 2014-07-10 NOTE — Progress Notes (Signed)
UR completed 

## 2015-03-03 ENCOUNTER — Other Ambulatory Visit: Payer: Self-pay | Admitting: Nephrology

## 2015-03-03 DIAGNOSIS — Z940 Kidney transplant status: Secondary | ICD-10-CM

## 2015-03-11 ENCOUNTER — Ambulatory Visit
Admission: RE | Admit: 2015-03-11 | Discharge: 2015-03-11 | Disposition: A | Discharge status: Home / Self Care | Payer: Medicare Other | Source: Ambulatory Visit | Attending: Nephrology | Admitting: Nephrology

## 2015-03-11 DIAGNOSIS — Z940 Kidney transplant status: Secondary | ICD-10-CM

## 2015-10-22 ENCOUNTER — Other Ambulatory Visit: Payer: Self-pay | Admitting: Nephrology

## 2015-10-22 DIAGNOSIS — R339 Retention of urine, unspecified: Secondary | ICD-10-CM

## 2015-10-29 ENCOUNTER — Other Ambulatory Visit: Payer: Self-pay

## 2015-11-07 ENCOUNTER — Other Ambulatory Visit: Payer: Self-pay

## 2016-02-10 ENCOUNTER — Ambulatory Visit
Admission: RE | Admit: 2016-02-10 | Discharge: 2016-02-10 | Disposition: A | Discharge status: Home / Self Care | Payer: Medicare Other | Source: Ambulatory Visit | Attending: Nephrology | Admitting: Nephrology

## 2016-02-10 DIAGNOSIS — R339 Retention of urine, unspecified: Secondary | ICD-10-CM

## 2017-06-19 ENCOUNTER — Other Ambulatory Visit: Payer: Self-pay | Admitting: Nephrology

## 2017-06-19 DIAGNOSIS — N320 Bladder-neck obstruction: Secondary | ICD-10-CM

## 2017-06-19 DIAGNOSIS — N179 Acute kidney failure, unspecified: Secondary | ICD-10-CM

## 2017-06-23 ENCOUNTER — Inpatient Hospital Stay
Admission: RE | Admit: 2017-06-23 | Discharge: 2017-06-23 | Disposition: A | Discharge status: Home / Self Care | Payer: Medicare Other | Source: Ambulatory Visit | Attending: Nephrology | Admitting: Nephrology

## 2017-06-23 ENCOUNTER — Other Ambulatory Visit: Payer: Medicare Other

## 2017-06-28 ENCOUNTER — Other Ambulatory Visit: Payer: Medicare Other

## 2017-07-05 ENCOUNTER — Ambulatory Visit
Admission: RE | Admit: 2017-07-05 | Discharge: 2017-07-05 | Disposition: A | Discharge status: Home / Self Care | Payer: Medicare Other | Source: Ambulatory Visit | Attending: Nephrology | Admitting: Nephrology

## 2017-07-05 DIAGNOSIS — N179 Acute kidney failure, unspecified: Secondary | ICD-10-CM

## 2017-07-05 DIAGNOSIS — N320 Bladder-neck obstruction: Secondary | ICD-10-CM

## 2017-08-17 ENCOUNTER — Other Ambulatory Visit: Payer: Self-pay | Admitting: Urology

## 2017-08-17 DIAGNOSIS — N1330 Unspecified hydronephrosis: Secondary | ICD-10-CM

## 2017-08-26 ENCOUNTER — Ambulatory Visit (HOSPITAL_COMMUNITY)
Admission: RE | Admit: 2017-08-26 | Discharge: 2017-08-26 | Disposition: A | Discharge status: Home / Self Care | Payer: Medicare Other | Source: Ambulatory Visit | Attending: Urology | Admitting: Urology

## 2017-08-26 DIAGNOSIS — N1330 Unspecified hydronephrosis: Secondary | ICD-10-CM | POA: Insufficient documentation

## 2017-08-26 DIAGNOSIS — Z940 Kidney transplant status: Secondary | ICD-10-CM | POA: Diagnosis not present

## 2017-08-26 MED ORDER — TECHNETIUM TC 99M MERTIATIDE
5.7000 | Freq: Once | INTRAVENOUS | Status: AC | PRN
  Administered 2017-08-26: 5.7 via INTRAVENOUS

## 2017-08-26 MED ORDER — FUROSEMIDE 10 MG/ML IJ SOLN
INTRAMUSCULAR | Status: AC
  Filled 2017-08-26: qty 8

## 2017-08-26 MED ORDER — FUROSEMIDE 10 MG/ML IJ SOLN
44.2000 mg | Freq: Once | INTRAMUSCULAR | Status: AC
  Administered 2017-08-26: 44.2 mg via INTRAVENOUS

## 2017-12-05 ENCOUNTER — Encounter (HOSPITAL_COMMUNITY)
Admission: RE | Admit: 2017-12-05 | Discharge: 2017-12-05 | Disposition: A | Discharge status: Home / Self Care | Payer: Medicare Other | Source: Ambulatory Visit | Attending: Nephrology | Admitting: Nephrology

## 2017-12-05 ENCOUNTER — Encounter (HOSPITAL_COMMUNITY): Payer: Medicare Other

## 2017-12-08 ENCOUNTER — Other Ambulatory Visit: Payer: Self-pay | Admitting: Urology

## 2017-12-30 ENCOUNTER — Encounter (HOSPITAL_COMMUNITY)
Admission: RE | Admit: 2017-12-30 | Discharge: 2017-12-30 | Disposition: A | Discharge status: Home / Self Care | Payer: Medicare Other | Source: Ambulatory Visit | Attending: Urology | Admitting: Urology

## 2017-12-30 ENCOUNTER — Encounter (HOSPITAL_COMMUNITY): Payer: Self-pay

## 2017-12-30 ENCOUNTER — Other Ambulatory Visit: Payer: Self-pay

## 2017-12-30 DIAGNOSIS — Z01810 Encounter for preprocedural cardiovascular examination: Secondary | ICD-10-CM | POA: Insufficient documentation

## 2017-12-30 DIAGNOSIS — N1330 Unspecified hydronephrosis: Secondary | ICD-10-CM | POA: Insufficient documentation

## 2017-12-30 DIAGNOSIS — Z01812 Encounter for preprocedural laboratory examination: Secondary | ICD-10-CM | POA: Insufficient documentation

## 2017-12-30 DIAGNOSIS — I10 Essential (primary) hypertension: Secondary | ICD-10-CM | POA: Insufficient documentation

## 2017-12-30 DIAGNOSIS — N400 Enlarged prostate without lower urinary tract symptoms: Secondary | ICD-10-CM | POA: Diagnosis not present

## 2017-12-30 DIAGNOSIS — E119 Type 2 diabetes mellitus without complications: Secondary | ICD-10-CM | POA: Diagnosis not present

## 2017-12-30 HISTORY — DX: Personal history of other diseases of the musculoskeletal system and connective tissue: Z87.39

## 2017-12-30 HISTORY — DX: Kidney transplant status: Z94.0

## 2017-12-30 LAB — HEMOGLOBIN A1C
HEMOGLOBIN A1C: 6.2 % — AB (ref 4.8–5.6)
Mean Plasma Glucose: 131.24 mg/dL

## 2017-12-30 LAB — BASIC METABOLIC PANEL
Anion gap: 13 (ref 5–15)
BUN: 37 mg/dL — ABNORMAL HIGH (ref 6–20)
CHLORIDE: 106 mmol/L (ref 98–111)
CO2: 24 mmol/L (ref 22–32)
Calcium: 9.8 mg/dL (ref 8.9–10.3)
Creatinine, Ser: 3.01 mg/dL — ABNORMAL HIGH (ref 0.61–1.24)
GFR calc Af Amer: 26 mL/min — ABNORMAL LOW (ref >60)
GFR, EST NON AFRICAN AMERICAN: 22 mL/min — AB (ref >60)
GLUCOSE: 147 mg/dL — AB (ref 70–99)
POTASSIUM: 3.7 mmol/L (ref 3.5–5.1)
SODIUM: 143 mmol/L (ref 135–145)

## 2017-12-30 LAB — CBC
HCT: 25.5 % — ABNORMAL LOW (ref 39.0–52.0)
Hemoglobin: 8.1 g/dL — ABNORMAL LOW (ref 13.0–17.0)
MCH: 27.4 pg (ref 26.0–34.0)
MCHC: 31.8 g/dL (ref 30.0–36.0)
MCV: 86.1 fL (ref 78.0–100.0)
PLATELETS: 277 10*3/uL (ref 150–400)
RBC: 2.96 MIL/uL — AB (ref 4.22–5.81)
RDW: 14 % (ref 11.5–15.5)
WBC: 5.7 10*3/uL (ref 4.0–10.5)

## 2017-12-30 LAB — GLUCOSE, CAPILLARY: GLUCOSE-CAPILLARY: 141 mg/dL — AB (ref 70–99)

## 2017-12-30 NOTE — Progress Notes (Signed)
CBC, BMP routed via epic to Dr Festus Aloe

## 2017-12-30 NOTE — Patient Instructions (Addendum)
Austin Murphy  12/30/2017   Your procedure is scheduled on: 01-06-18   Report to [facility] Main  Entrance    Report to admitting at 8:00AM    Call this number if you have problems the morning of surgery [phone]     Remember: Do not eat food or drink liquids :After Midnight.     Take these medicines the morning of surgery with A SIP OF WATER: amlodipine, atenolol, tylenol if needed, cellcept , prograf                                You may not have any metal on your body including hair pins and              piercings  Do not wear jewelry, make-up, lotions, powders or perfumes, deodorant                        Men may shave face and neck.     Do not bring valuables to the hospital. [place].  Contacts, dentures or bridgework may not be worn into surgery.       Patients discharged the day of surgery will not be allowed to drive home.  Name and phone number of your driver:  Special Instructions: N/A              Please read over the following fact sheets you were given: _____________________________________________________________________             How to Manage Your Diabetes Before and After Surgery  Why is it important to control my blood sugar before and after surgery? . Improving blood sugar levels before and after surgery helps healing and can limit problems. . A way of improving blood sugar control is eating a healthy diet by: o  Eating less sugar and carbohydrates o  Increasing activity/exercise o  Talking with your doctor about reaching your blood sugar goals . High blood sugars (greater than 180 mg/dL) can raise your risk of infections and slow your recovery, so you will need to focus on controlling your diabetes during the weeks before surgery. . Make sure that the doctor who takes care of your diabetes knows about your planned surgery including the date and location.  How do I  manage my blood sugar before surgery? . Check your blood sugar at least 4 times a day, starting 2 days before surgery, to make sure that the level is not too high or low. o Check your blood sugar the morning of your surgery when you wake up and every 2 hours until you get to the Short Stay unit. . If your blood sugar is less than 70 mg/dL, you will need to treat for low blood sugar: o Do not take insulin. o Treat a low blood sugar (less than 70 mg/dL) with  cup of clear juice (cranberry or apple), 4 glucose tablets, OR glucose gel. o Recheck blood sugar in 15 minutes after treatment (to make sure it is greater than 70 mg/dL). If your blood sugar is not greater than 70 mg/dL on recheck, call [phone] for further instructions. . Report your blood sugar to the short stay nurse when you get to Short Stay.  Marland Kitchen  If you are admitted to the hospital after surgery: o Your blood sugar will be checked by the staff and you will probably be given insulin after surgery (instead of oral diabetes medicines) to make sure you have good blood sugar levels. o The goal for blood sugar control after surgery is 80-180 mg/dL.   WHAT DO I DO ABOUT MY DIABETES MEDICATION?    THE MORNING OF SURGERY, Do not take oral diabetes medicines (pills) the morning of surgery.    Patient Signature:  Date:   Nurse Signature:  Date:   Reviewed and Endorsed by [facility] Patient Education Committee, August 2015    [facility] - Preparing for Surgery Before surgery, you can play an important role.  Because skin is not sterile, your skin needs to be as free of germs as possible.  You can reduce the number of germs on your skin by washing with CHG (chlorahexidine gluconate) soap before surgery.  CHG is an antiseptic cleaner which kills germs and bonds with the skin to continue killing germs even after washing. Please DO NOT use if you have an allergy to CHG or antibacterial soaps.  If your skin becomes reddened/irritated  stop using the CHG and inform your nurse when you arrive at Short Stay. Do not shave (including legs and underarms) for at least 48 hours prior to the first CHG shower.  You may shave your face/neck. Please follow these instructions carefully:  1.  Shower with CHG Soap the night before surgery and the  morning of Surgery.  2.  If you choose to wash your hair, wash your hair first as usual with your  normal  shampoo.  3.  After you shampoo, rinse your hair and body thoroughly to remove the  shampoo.                           4.  Use CHG as you would any other liquid soap.  You can apply chg directly  to the skin and wash                       Gently with a scrungie or clean washcloth.  5.  Apply the CHG Soap to your body ONLY FROM THE NECK DOWN.   Do not use on face/ open                           Wound or open sores. Avoid contact with eyes, ears mouth and genitals (private parts).                       Wash face,  Genitals (private parts) with your normal soap.             6.  Wash thoroughly, paying special attention to the area where your surgery  will be performed.  7.  Thoroughly rinse your body with warm water from the neck down.  8.  DO NOT shower/wash with your normal soap after using and rinsing off  the CHG Soap.                9.  Pat yourself dry with a clean towel.            10.  Wear clean pajamas.            11.  Place clean sheets on your bed the night of  your first shower and do not  sleep with pets. Day of Surgery : Do not apply any lotions/deodorants the morning of surgery.  Please wear clean clothes to the hospital/surgery center.  FAILURE TO FOLLOW THESE INSTRUCTIONS MAY RESULT IN THE CANCELLATION OF YOUR SURGERY PATIENT SIGNATURE_________________________________  NURSE SIGNATURE__________________________________  ________________________________________________________________________

## 2018-01-06 ENCOUNTER — Ambulatory Visit (HOSPITAL_COMMUNITY)
Admission: RE | Admit: 2018-01-06 | Discharge: 2018-01-06 | Disposition: A | Discharge status: Home / Self Care | Payer: Medicare Other | Source: Ambulatory Visit | Attending: Urology | Admitting: Urology

## 2018-01-06 ENCOUNTER — Ambulatory Visit (HOSPITAL_COMMUNITY): Payer: Medicare Other | Admitting: Certified Registered Nurse Anesthetist

## 2018-01-06 ENCOUNTER — Encounter (HOSPITAL_COMMUNITY)
Admission: RE | Disposition: A | Discharge status: Home / Self Care | Payer: Self-pay | Source: Ambulatory Visit | Attending: Urology

## 2018-01-06 ENCOUNTER — Encounter (HOSPITAL_COMMUNITY): Payer: Self-pay | Admitting: Emergency Medicine

## 2018-01-06 ENCOUNTER — Ambulatory Visit (HOSPITAL_COMMUNITY): Payer: Medicare Other

## 2018-01-06 DIAGNOSIS — M109 Gout, unspecified: Secondary | ICD-10-CM | POA: Insufficient documentation

## 2018-01-06 DIAGNOSIS — Z79899 Other long term (current) drug therapy: Secondary | ICD-10-CM | POA: Insufficient documentation

## 2018-01-06 DIAGNOSIS — Z940 Kidney transplant status: Secondary | ICD-10-CM | POA: Insufficient documentation

## 2018-01-06 DIAGNOSIS — Z87891 Personal history of nicotine dependence: Secondary | ICD-10-CM | POA: Diagnosis not present

## 2018-01-06 DIAGNOSIS — Z7984 Long term (current) use of oral hypoglycemic drugs: Secondary | ICD-10-CM | POA: Insufficient documentation

## 2018-01-06 DIAGNOSIS — Z905 Acquired absence of kidney: Secondary | ICD-10-CM | POA: Diagnosis not present

## 2018-01-06 DIAGNOSIS — I10 Essential (primary) hypertension: Secondary | ICD-10-CM | POA: Insufficient documentation

## 2018-01-06 DIAGNOSIS — N1339 Other hydronephrosis: Secondary | ICD-10-CM | POA: Insufficient documentation

## 2018-01-06 DIAGNOSIS — N401 Enlarged prostate with lower urinary tract symptoms: Secondary | ICD-10-CM | POA: Insufficient documentation

## 2018-01-06 DIAGNOSIS — N320 Bladder-neck obstruction: Secondary | ICD-10-CM | POA: Insufficient documentation

## 2018-01-06 DIAGNOSIS — E119 Type 2 diabetes mellitus without complications: Secondary | ICD-10-CM | POA: Diagnosis not present

## 2018-01-06 DIAGNOSIS — N138 Other obstructive and reflux uropathy: Secondary | ICD-10-CM

## 2018-01-06 HISTORY — PX: THULIUM LASER TURP (TRANSURETHRAL RESECTION OF PROSTATE): SHX6744

## 2018-01-06 HISTORY — PX: CYSTOSCOPY W/ URETERAL STENT PLACEMENT: SHX1429

## 2018-01-06 LAB — GLUCOSE, CAPILLARY
GLUCOSE-CAPILLARY: 137 mg/dL — AB (ref 70–99)
Glucose-Capillary: 118 mg/dL — ABNORMAL HIGH (ref 70–99)

## 2018-01-06 SURGERY — THULIUM LASER TURP (TRANSURETHRAL RESECTION OF PROSTATE)
Anesthesia: General

## 2018-01-06 MED ORDER — LIDOCAINE 2% (20 MG/ML) 5 ML SYRINGE
INTRAMUSCULAR | Status: AC
  Filled 2018-01-06: qty 5

## 2018-01-06 MED ORDER — SODIUM CHLORIDE 0.9 % IR SOLN
Status: DC | PRN
  Administered 2018-01-06: 15000 mL

## 2018-01-06 MED ORDER — HYDROMORPHONE HCL 1 MG/ML IJ SOLN: 0.2500 mg | INTRAMUSCULAR | Status: DC | PRN

## 2018-01-06 MED ORDER — ONDANSETRON HCL 4 MG/2ML IJ SOLN
INTRAMUSCULAR | Status: DC | PRN
  Administered 2018-01-06: 4 mg via INTRAVENOUS

## 2018-01-06 MED ORDER — SODIUM CHLORIDE 0.9 % IV SOLN
INTRAVENOUS | Status: DC
  Administered 2018-01-06: 09:00:00 via INTRAVENOUS

## 2018-01-06 MED ORDER — ONDANSETRON HCL 4 MG/2ML IJ SOLN
INTRAMUSCULAR | Status: AC
  Filled 2018-01-06: qty 2

## 2018-01-06 MED ORDER — DEXAMETHASONE SODIUM PHOSPHATE 10 MG/ML IJ SOLN
INTRAMUSCULAR | Status: AC
  Filled 2018-01-06: qty 1

## 2018-01-06 MED ORDER — 0.9 % SODIUM CHLORIDE (POUR BTL) OPTIME
TOPICAL | Status: DC | PRN
  Administered 2018-01-06: 1000 mL

## 2018-01-06 MED ORDER — CEPHALEXIN 250 MG PO CAPS: 250.0000 mg | ORAL_CAPSULE | Freq: Every day | ORAL | 0 refills | Status: DC

## 2018-01-06 MED ORDER — FENTANYL CITRATE (PF) 100 MCG/2ML IJ SOLN
INTRAMUSCULAR | Status: DC | PRN
  Administered 2018-01-06 (×2): 50 ug via INTRAVENOUS

## 2018-01-06 MED ORDER — DEXAMETHASONE SODIUM PHOSPHATE 10 MG/ML IJ SOLN
INTRAMUSCULAR | Status: DC | PRN
  Administered 2018-01-06: 5 mg via INTRAVENOUS

## 2018-01-06 MED ORDER — MIDAZOLAM HCL 5 MG/5ML IJ SOLN
INTRAMUSCULAR | Status: DC | PRN
  Administered 2018-01-06: 2 mg via INTRAVENOUS

## 2018-01-06 MED ORDER — LIDOCAINE 2% (20 MG/ML) 5 ML SYRINGE
INTRAMUSCULAR | Status: DC | PRN
  Administered 2018-01-06: 100 mg via INTRAVENOUS

## 2018-01-06 MED ORDER — FENTANYL CITRATE (PF) 100 MCG/2ML IJ SOLN
INTRAMUSCULAR | Status: AC
  Filled 2018-01-06: qty 2

## 2018-01-06 MED ORDER — STERILE WATER FOR IRRIGATION IR SOLN
Status: DC | PRN
  Administered 2018-01-06: 500 mL

## 2018-01-06 MED ORDER — MIDAZOLAM HCL 2 MG/2ML IJ SOLN
INTRAMUSCULAR | Status: AC
  Filled 2018-01-06: qty 2

## 2018-01-06 MED ORDER — CEFAZOLIN SODIUM-DEXTROSE 2-4 GM/100ML-% IV SOLN
2.0000 g | Freq: Once | INTRAVENOUS | Status: AC
  Administered 2018-01-06: 2 g via INTRAVENOUS
  Filled 2018-01-06: qty 100

## 2018-01-06 MED ORDER — PROPOFOL 10 MG/ML IV BOLUS
INTRAVENOUS | Status: DC | PRN
  Administered 2018-01-06: 180 mg via INTRAVENOUS

## 2018-01-06 MED ORDER — ONDANSETRON HCL 4 MG/2ML IJ SOLN: 4.0000 mg | Freq: Once | INTRAMUSCULAR | Status: DC | PRN

## 2018-01-06 MED ORDER — MEPERIDINE HCL 50 MG/ML IJ SOLN: 6.2500 mg | INTRAMUSCULAR | Status: DC | PRN

## 2018-01-06 MED ORDER — PROPOFOL 10 MG/ML IV BOLUS
INTRAVENOUS | Status: AC
  Filled 2018-01-06: qty 20

## 2018-01-06 SURGICAL SUPPLY — 18 items
BAG URINE DRAINAGE (UROLOGICAL SUPPLIES) ×2 IMPLANT
BAG URO CATCHER STRL LF (MISCELLANEOUS) ×3 IMPLANT
BASKET ZERO TIP NITINOL 2.4FR (BASKET) IMPLANT
BSKT STON RTRVL ZERO TP 2.4FR (BASKET)
CATH INTERMIT  6FR 70CM (CATHETERS) IMPLANT
CATH TIEMANN FOLEY 18FR 5CC (CATHETERS) ×2 IMPLANT
CLOTH BEACON ORANGE TIMEOUT ST (SAFETY) ×3 IMPLANT
GLOVE BIOGEL M STRL SZ7.5 (GLOVE) ×3 IMPLANT
GOWN STRL REUS W/TWL LRG LVL3 (GOWN DISPOSABLE) ×3 IMPLANT
GOWN STRL REUS W/TWL XL LVL3 (GOWN DISPOSABLE) ×3 IMPLANT
GUIDEWIRE ANG ZIPWIRE 038X150 (WIRE) IMPLANT
GUIDEWIRE STR DUAL SENSOR (WIRE) ×3 IMPLANT
LASER REVOLIX PROCEDURE (MISCELLANEOUS) ×2 IMPLANT
MANIFOLD NEPTUNE II (INSTRUMENTS) ×3 IMPLANT
PACK CYSTO (CUSTOM PROCEDURE TRAY) ×3 IMPLANT
TUBING CONNECTING 10 (TUBING) ×2 IMPLANT
TUBING CONNECTING 10' (TUBING) ×1
TUBING UROLOGY SET (TUBING) ×2 IMPLANT

## 2018-01-06 NOTE — H&P (Signed)
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  He has stable mild hydronephrosis of his 2 transplant kidneys in the left pelvis which has been evaluated by antegrade nephrostogram as well as Lasix renal scans without significant obstruction.  I thought possibly is bladder outlet obstruction which has been proven on urodynamics is causing some reflux nephropathy.  He presents today for thulium laser vaporization of the prostate to hopefully improve his flow and emptying.  He has had no dysuria or gross hematuria.  Past Medical History:  Diagnosis Date  . Diabetes mellitus without complication ([place])   . Hx of gout   . Hypertension   . 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 Renal disorder   . Seizures ([place])    as a child   Past Surgical History:  Procedure Laterality Date  . AV FISTULA PLACEMENT Right   . NEPHRECTOMY TRANSPLANTED ORGAN      Home Medications:  Medications Prior to Admission  Medication Sig Dispense Refill Last Dose  . acetaminophen (TYLENOL) 500 MG tablet Take 1,000-1,500 mg by mouth 2 (two) times daily as needed for moderate pain.   Past Month at Unknown time  . amLODipine (NORVASC) 10 MG tablet Take 10 mg by mouth daily.   01/06/2018 at 0730  . atenolol (TENORMIN) 100 MG tablet Take 0.5 tablets (50 mg total) by mouth daily. (Patient taking differently: Take 100 mg by mouth 2 (two) times daily. ) 30 tablet 0 01/06/2018 at 0730  . calcium carbonate (TUMS - DOSED IN MG ELEMENTAL CALCIUM) 500 MG chewable tablet Chew 1 tablet by mouth daily.   01/05/2018 at Unknown time  . doxazosin (CARDURA) 8 MG tablet Take 8 mg by mouth every evening.   01/05/2018 at Unknown time  . famotidine (PEPCID) 20 MG tablet Take 20 mg by mouth every evening.    01/05/2018 at Unknown time  . furosemide (LASIX) 80 MG  tablet Take 1 tablet (80 mg total) by mouth daily. (Patient taking differently: Take 80 mg by mouth 2 (two) times daily. ) 30 tablet 0 01/06/2018 at 0730  . metFORMIN (GLUCOPHAGE) 500 MG tablet Take by mouth 2 (two) times daily with a meal.   01/05/2018 at Unknown time  . mycophenolate (CELLCEPT) 250 MG capsule Take 1,000 mg by mouth 2 (two) times daily.   01/06/2018 at 0730  . potassium chloride SA (K-DUR,KLOR-CON) 20 MEQ tablet Take 20 mEq by mouth 2 (two) times daily.   01/05/2018 at Unknown time  . predniSONE (DELTASONE) 20 MG tablet Take 20 mg by mouth every evening.   01/05/2018 at Unknown time  . tacrolimus (PROGRAF) 1 MG capsule Take 3 mg by mouth 2 (two) times daily.    01/06/2018 at 0730  . tacrolimus (PROGRAF) 5 MG capsule Take 5 mg by mouth 2 (two) times daily.    01/06/2018 at 0730  . tamsulosin (FLOMAX) 0.4 MG CAPS capsule Take 1 capsule (0.4 mg total) by mouth daily. (Patient not taking: Reported on 12/27/2017) 30 capsule 0 Not Taking at Unknown time   Allergies: No Known Allergies  History reviewed. No pertinent family history. Social History:  reports that he quit smoking about 13 years ago. His smoking use included cigarettes. He smoked 0.00 packs per day for 3.00 years. He has never used smokeless tobacco. He reports that he drank alcohol. He reports  that he does not use drugs.  ROS: A complete review of systems was performed.  All systems are negative except for pertinent findings as noted. Review of Systems  All other systems reviewed and are negative.    Physical Exam:  Vital signs in last 24 hours: Temp:  [98.2 F (36.8 C)] 98.2 F (36.8 C) (08/16 0830) Pulse Rate:  [72] 72 (08/16 0830) Resp:  [18] 18 (08/16 0830) BP: (141)/(82) 141/82 (08/16 0830) SpO2:  [98 %] 98 % (08/16 0830) Weight:  [75.3 kg] 75.3 kg (08/16 0829) General:  Alert and oriented, No acute distress HEENT: Normocephalic, atraumatic Cardiovascular: Regular rate and rhythm Lungs: Regular rate and  effort Abdomen: Soft, nontender, nondistended, no abdominal masses Back: No CVA tenderness Extremities: No edema, right fistula nonpulsatile Neurologic: Grossly intact  Laboratory Data:  Results for orders placed or performed during the hospital encounter of 01/06/18 (from the past 24 hour(s))  Glucose, capillary     Status: Abnormal   Collection Time: 01/06/18  7:53 AM  Result Value Ref Range   Glucose-Capillary 118 (H) 70 - 99 mg/dL   Comment 1 Notify RN    Comment 2 Document in Chart    No results found for this or any previous visit (from the past 240 hour(s)). Creatinine: Recent Labs    12/30/17 1010  CREATININE 3.01*    Impression/Assessment/plan:  I discussed with the patient the nature, potential benefits, risks and alternatives to thulium laser vaporization of the prostate, including side effects of the proposed treatment, the likelihood of the patient achieving the goals of the procedure, and any potential problems that might occur during the procedure or recuperation.  We discussed risk of incontinence and stricture among others.  All questions answered. Patient elects to proceed.   Festus Aloe 01/06/2018, 9:42 AM

## 2018-01-06 NOTE — Anesthesia Procedure Notes (Signed)
Procedure Name: LMA Insertion Date/Time: 01/06/2018 10:09 AM Performed by: Montel Clock, CRNA Pre-anesthesia Checklist: Patient identified, Emergency Drugs available, Suction available, Patient being monitored and Timeout performed Patient Re-evaluated:Patient Re-evaluated prior to induction Oxygen Delivery Method: Circle system utilized Preoxygenation: Pre-oxygenation with 100% oxygen Induction Type: IV induction LMA: LMA with gastric port inserted LMA Size: 4.0 Number of attempts: 1 Dental Injury: Teeth and Oropharynx as per pre-operative assessment

## 2018-01-06 NOTE — Op Note (Signed)
Preoperative diagnosis: BPH with bladder outlet obstruction, transplant hydronephrosis  Postoperative diagnosis: Same  Procedure: Cystoscopy, thulium laser vaporization of the prostate  Surgeon: Junious Silk  Anesthesia: General  Indication for procedure: 52 year old with weak stream and incomplete bladder emptying.  This may be affecting his transplant kidney.  After posting the case I discovered more records on care everywhere where the patient underwent percutaneous nephrostomy and antegrade nephrostogram of both transplant kidneys at [facility] which showed patent ureters.  Findings: On cystoscopy the prostate lateral lobes were obstructing as well as an obstructing elongated median lobe.  Moderate trabeculation of the bladder, large capacity bladder, no stone or tumor in the bladder.  Transplant ureteral orifice left anterior with good efflux which was clear.  Description of procedure: After consent was obtained the patient brought to the operating room.  After adequate anesthesia was placed in lithotomy position and prepped and draped in the usual sterile fashion.  A timeout was performed to confirm the patient and procedure.  The cystoscope was passed per urethra and the bladder inspected.  He has a large capacity bladder.  He likely has a psoas hitch.  Transplant ureteral orifice identified with good efflux.  I then turned my attention to the prostate and with the 1000 m laser fiber I made an incision at 5:00 and 7:00 through the prostate and bladder neck and brought this down to the verumontanum.  The median lobe was then vaporized.  Lateral lobe and anterior tissue was vaporized from anterior to posterior bladder neck down to the verumontanum.  I did note his native ureteral orifices.  This created an excellent channel.  Hemostasis was excellent under low pressure.  The bladder was filled and the scope removed.  An 28 French Foley catheter was placed in left to gravity drainage.  Drainage and  irrigation was clear.  He was then awakened taken to the recovery room in stable condition.  Complications: None  Blood loss: Minimal  Specimens: None  Drains: 18 French coud catheter

## 2018-01-06 NOTE — Anesthesia Postprocedure Evaluation (Signed)
Anesthesia Post Note  Patient: Tally Joe  Procedure(s) Performed: THULIUM LASER TURP (TRANSURETHRAL RESECTION OF PROSTATE) (N/A ) CYSTOSCOPY (N/A )     Patient location during evaluation: PACU Anesthesia Type: General Level of consciousness: awake and alert Pain management: pain level controlled Vital Signs Assessment: post-procedure vital signs reviewed and stable Respiratory status: spontaneous breathing, nonlabored ventilation, respiratory function stable and patient connected to nasal cannula oxygen Cardiovascular status: blood pressure returned to baseline and stable Postop Assessment: no apparent nausea or vomiting Anesthetic complications: no    Last Vitals:  Vitals:   01/06/18 1200 01/06/18 1230  BP: 127/77 122/79  Pulse: 64 70  Resp: 13 16  Temp: 36.8 C (!) 36.4 C  SpO2: 100% 100%    Last Pain:  Vitals:   01/06/18 1230  TempSrc:   PainSc: 0-No pain                 Azia Toutant DAVID

## 2018-01-06 NOTE — Discharge Instructions (Signed)
Indwelling Urinary Catheter Care, Adult Take good care of your catheter to keep it working and to prevent problems. How to wear your catheter Attach your catheter to your leg with tape (adhesive tape) or a leg strap. Make sure it is not too tight. If you use tape, remove any bits of tape that are already on the catheter. How to wear a drainage bag You should have:  A large overnight bag.  A small leg bag.  Overnight Bag You may wear the overnight bag at any time. Always keep the bag below the level of your bladder but off the floor. When you sleep, put a clean plastic bag in a wastebasket. Then hang the bag inside the wastebasket. Leg Bag Never wear the leg bag at night. Always wear the leg bag below your knee. Keep the leg bag secure with a leg strap or tape. How to care for your skin  Clean the skin around the catheter at least once every day.  Shower every day. Do not take baths.  Put creams, lotions, or ointments on your genital area only as told by your doctor.  Do not use powders, sprays, or lotions on your genital area. How to clean your catheter and your skin 1. Wash your hands with soap and water. 2. Wet a washcloth in warm water and gentle (mild) soap. 3. Use the washcloth to clean the skin where the catheter enters your body. Clean downward and wipe away from the catheter in small circles. Do not wipe toward the catheter. 4. Pat the area dry with a clean towel. Make sure to clean off all soap. How to care for your drainage bags Empty your drainage bag when it is ?- full or at least 2-3 times a day. Replace your drainage bag once a month or sooner if it starts to smell bad or look dirty. Do not clean your drainage bag unless told by your doctor. Emptying a drainage bag  Supplies Needed  Rubbing alcohol.  Gauze pad or cotton ball.  Tape or a leg strap.  Steps 1. Wash your hands with soap and water. 2. Separate (detach) the bag from your leg. 3. Hold the bag over  the toilet or a clean container. Keep the bag below your hips and bladder. This stops pee (urine) from going back into the tube. 4. Open the pour spout at the bottom of the bag. 5. Empty the pee into the toilet or container. Do not let the pour spout touch any surface. 6. Put rubbing alcohol on a gauze pad or cotton ball. 7. Use the gauze pad or cotton ball to clean the pour spout. 8. Close the pour spout. 9. Attach the bag to your leg with tape or a leg strap. 10. Wash your hands.  Changing a drainage bag Supplies Needed  Alcohol wipes.  A clean drainage bag.  Adhesive tape or a leg strap.  Steps 1. Wash your hands with soap and water. 2. Separate the dirty bag from your leg. 3. Pinch the rubber catheter with your fingers so that pee does not spill out. 4. Separate the catheter tube from the drainage tube where these tubes connect (at the connection valve). Do not let the tubes touch any surface. 5. Clean the end of the catheter tube with an alcohol wipe. Use a different alcohol wipe to clean the end of the drainage tube. 6. Connect the catheter tube to the drainage tube of the clean bag. 7. Attach the new bag to  the leg with adhesive tape or a leg strap. °8. Wash your hands. ° °How to prevent infection and other problems °· Never pull on your catheter or try to remove it. Pulling can damage tissue in your body. °· Always wash your hands before and after touching your catheter. °· If a leg strap gets wet, replace it with a dry one. °· Drink enough fluids to keep your pee clear or pale yellow, or as told by your doctor. °· Do not let the drainage bag or tubing touch the floor. °· Wear cotton underwear. °· If you are male, wipe from front to back after you poop (have a bowel movement). °· Check on the catheter often to make sure it works and the tubing is not twisted. °Get help if: °· Your pee is cloudy. °· Your pee smells unusually bad. °· Your pee is not draining into the bag. °· Your  tube gets clogged. °· Your catheter starts to leak. °· Your bladder feels full. °Get help right away if: °· You have redness, swelling, or pain where the catheter enters your body. °· You have fluid, pus, or a bad smell coming from the area where the catheter enters your body. °· The area where the catheter enters your body feels warm. °· You have a fever. °· You have pain in your: °? Stomach (abdomen). °? Legs. °? Lower back. °? Bladder. °· You see blood fill the catheter. °· Your pee is pink or red. °· You feel sick to your stomach (nauseous). °· You throw up (vomit). °· You have chills. °· Your catheter gets pulled out. °This information is not intended to replace advice given to you by your health care provider. Make sure you discuss any questions you have with your health care provider. °Document Released: 09/04/2012 Document Revised: 04/07/2016 Document Reviewed: 10/23/2013 °Elsevier Interactive Patient Education © 2018 [employer] ° ° °Prostate Laser Surgery, Care After °This sheet gives you information about how to care for yourself after your procedure. Your health care provider may also give you more specific instructions. If you have problems or questions, contact your health care provider. °What can I expect after the procedure? °For the first few weeks after the procedure: °· You will feel a need to urinate often. °· You may have blood in your urine. °· You may feel a sudden need to urinate. ° °Once your urinary catheter is removed, you may have a burning feeling when you urinate, especially at the end of urination. This feeling usually passes within 3-5 days. °Follow these instructions at home: °Activity °· Return to your normal activities as told by your health care provider. Ask your health care provider what activities are safe for you. °· Do not do vigorous exercise for 1 week or as told by your health care provider. °· Do not lift anything that is heavier than 10 lb (4.5 kg) until your health  care provider say it is safe. °· Avoid sexual activity for 4-6 weeks or as told by your health care provider. °· Do not ride in a car for extended periods of time for 1 month or as told by your health care provider. °· Do not drive for 24 hours if you were given a medicine to help you relax (sedative). °Diet °· Eat foods that are high in fiber, such as fresh fruits and vegetables, whole grains, and beans. °· Drink enough fluid to keep your urine clear or pale yellow. °Medicines °· Take over-the-counter and prescription medicines,   including stool softeners, only as told by your health care provider.  If you were prescribed an antibiotic medicine, take it as told by your health care provider. Do not stop taking the antibiotic even if you start to feel better. General instructions  If you were given elastic support stockings, wear them as told by your health care provider.  Do not strain to have a bowel movement. Straining may lead to bleeding from the prostate and cause clots to form and cause trouble urinating.  Keep all follow-up visits as told by your health care provider. This is important. Contact a health care provider if:  You have a fever or chills.  You have spasms or pain with the urinary catheter still in place.  Once the catheter has been removed, you experience difficulty starting your stream when attempting to urinate. Get help right away if:  There is a blockage in your catheter.  Your catheter has been removed and you are suddenly unable to urinate.  Your urine smells unusually bad.  You start to have blood clots in your urine.  The blood in your urine becomes persistent or gets thick.  You develop chest pains.  You develop shortness of breath.  You develop swelling or pain in your leg. Summary  You may notice urinary symptoms for a few weeks after your procedure.  Follow instructions from your health care provider regarding activity restrictions such as lifting,  exercise, and sexual activity.  Contact your health care provider if you have any unusual symptoms during your recovery. This information is not intended to replace advice given to you by your health care provider. Make sure you discuss any questions you have with your health care provider. Document Released: 05/10/2005 Document Revised: 12/26/2015 Document Reviewed: 12/26/2015 Elsevier Interactive Patient Education  2018 [employer].

## 2018-01-06 NOTE — Transfer of Care (Signed)
Immediate Anesthesia Transfer of Care Note  Patient: Austin Murphy  Procedure(s) Performed: THULIUM LASER TURP (TRANSURETHRAL RESECTION OF PROSTATE) (N/A ) CYSTOSCOPY (N/A )  Patient Location: PACU  Anesthesia Type:General  Level of Consciousness: sedated  Airway & Oxygen Therapy: Patient connected to face mask oxygen  Post-op Assessment: Report given to RN and Post -op Vital signs reviewed and stable  Post vital signs: Reviewed and stable  Last Vitals:  Vitals Value Taken Time  BP 108/73 01/06/2018 11:18 AM  Temp    Pulse 58 01/06/2018 11:20 AM  Resp 12 01/06/2018 11:20 AM  SpO2 100 % 01/06/2018 11:20 AM  Vitals shown include unvalidated device data.  Last Pain:  Vitals:   01/06/18 0830  TempSrc: Oral  PainSc:       Patients Stated Pain Goal: 4 (65/82/60 [id number])  Complications: No apparent anesthesia complications

## 2018-01-06 NOTE — Progress Notes (Signed)
Pt reports initially that he would be taking public transportation home. Spoke to his friend Bruce via telephone who has agreed to come pick up pt instead. Bruce states he will be here at 1130 today. His phone number: [phone]. Pt states his brother Lennette Bihari will be staying with him for 24 hours after the procedure. Explained to pt the importance of someone staying with him after anesthesia. Bruce was told not to leave pt at home if his brother Santiago Glad wasn't home when they got there. Pt verbalized understanding and importance of these instructions.

## 2018-01-06 NOTE — Progress Notes (Signed)
Dr. Lissa Hoard okay with lab work. NS for pt instead of LR due to Creatinine.

## 2018-01-06 NOTE — Anesthesia Preprocedure Evaluation (Addendum)
Anesthesia Evaluation  Patient identified by MRN, date of birth, ID band Patient awake    Reviewed: Allergy & Precautions, NPO status , Patient's Chart, lab work & pertinent test results  Airway Mallampati: II  TM Distance: >3 FB Neck ROM: Full    Dental  (+) Dental Advisory Given   Pulmonary neg pulmonary ROS, former smoker,    Pulmonary exam normal breath sounds clear to auscultation       Cardiovascular hypertension, Normal cardiovascular exam Rhythm:Regular Rate:Normal     Neuro/Psych Seizures -,  negative psych ROS   GI/Hepatic negative GI ROS, Neg liver ROS,   Endo/Other  negative endocrine ROSdiabetes  Renal/GU Renal Insufficiency and CRFRenal disease     Musculoskeletal negative musculoskeletal ROS (+)   Abdominal   Peds  Hematology negative hematology ROS (+)   Anesthesia Other Findings   Reproductive/Obstetrics negative OB ROS                             Anesthesia Physical Anesthesia Plan  ASA: III  Anesthesia Plan: General   Post-op Pain Management:    Induction: Intravenous  PONV Risk Score and Plan: 3 and Ondansetron, Dexamethasone, Treatment may vary due to age or medical condition and Midazolam  Airway Management Planned: LMA  Additional Equipment:   Intra-op Plan:   Post-operative Plan: Extubation in OR  Informed Consent: I have reviewed the patients History and Physical, chart, labs and discussed the procedure including the risks, benefits and alternatives for the proposed anesthesia with the patient or authorized representative who has indicated his/her understanding and acceptance.   Dental advisory given  Plan Discussed with: CRNA  Anesthesia Plan Comments:        Anesthesia Quick Evaluation

## 2018-01-07 ENCOUNTER — Encounter (HOSPITAL_COMMUNITY): Payer: Self-pay | Admitting: Urology

## 2018-02-28 ENCOUNTER — Inpatient Hospital Stay
Admission: EM | Admit: 2018-02-28 | Discharge: 2018-03-02 | DRG: 683 | Disposition: A | Discharge status: Home / Self Care | Payer: Medicare Other | Source: Ambulatory Visit | Attending: Internal Medicine | Admitting: Internal Medicine

## 2018-02-28 ENCOUNTER — Other Ambulatory Visit: Payer: Self-pay

## 2018-02-28 ENCOUNTER — Encounter: Payer: Self-pay | Admitting: Emergency Medicine

## 2018-02-28 DIAGNOSIS — N2581 Secondary hyperparathyroidism of renal origin: Secondary | ICD-10-CM | POA: Diagnosis present

## 2018-02-28 DIAGNOSIS — I129 Hypertensive chronic kidney disease with stage 1 through stage 4 chronic kidney disease, or unspecified chronic kidney disease: Secondary | ICD-10-CM | POA: Diagnosis present

## 2018-02-28 DIAGNOSIS — M109 Gout, unspecified: Secondary | ICD-10-CM | POA: Diagnosis present

## 2018-02-28 DIAGNOSIS — N3090 Cystitis, unspecified without hematuria: Secondary | ICD-10-CM

## 2018-02-28 DIAGNOSIS — Z7984 Long term (current) use of oral hypoglycemic drugs: Secondary | ICD-10-CM | POA: Diagnosis not present

## 2018-02-28 DIAGNOSIS — N179 Acute kidney failure, unspecified: Secondary | ICD-10-CM | POA: Diagnosis present

## 2018-02-28 DIAGNOSIS — D631 Anemia in chronic kidney disease: Secondary | ICD-10-CM | POA: Diagnosis present

## 2018-02-28 DIAGNOSIS — Z7952 Long term (current) use of systemic steroids: Secondary | ICD-10-CM

## 2018-02-28 DIAGNOSIS — E1122 Type 2 diabetes mellitus with diabetic chronic kidney disease: Secondary | ICD-10-CM | POA: Diagnosis present

## 2018-02-28 DIAGNOSIS — Z940 Kidney transplant status: Secondary | ICD-10-CM | POA: Diagnosis not present

## 2018-02-28 DIAGNOSIS — N184 Chronic kidney disease, stage 4 (severe): Secondary | ICD-10-CM | POA: Diagnosis present

## 2018-02-28 DIAGNOSIS — Z833 Family history of diabetes mellitus: Secondary | ICD-10-CM | POA: Diagnosis not present

## 2018-02-28 DIAGNOSIS — Z8249 Family history of ischemic heart disease and other diseases of the circulatory system: Secondary | ICD-10-CM | POA: Diagnosis not present

## 2018-02-28 DIAGNOSIS — Z87891 Personal history of nicotine dependence: Secondary | ICD-10-CM | POA: Diagnosis not present

## 2018-02-28 DIAGNOSIS — N400 Enlarged prostate without lower urinary tract symptoms: Secondary | ICD-10-CM | POA: Diagnosis present

## 2018-02-28 DIAGNOSIS — N189 Chronic kidney disease, unspecified: Secondary | ICD-10-CM

## 2018-02-28 DIAGNOSIS — Z79899 Other long term (current) drug therapy: Secondary | ICD-10-CM | POA: Diagnosis not present

## 2018-02-28 DIAGNOSIS — D649 Anemia, unspecified: Secondary | ICD-10-CM | POA: Diagnosis present

## 2018-02-28 LAB — FERRITIN: FERRITIN: 411 ng/mL — AB (ref 24–336)

## 2018-02-28 LAB — URINALYSIS, COMPLETE (UACMP) WITH MICROSCOPIC
BILIRUBIN URINE: NEGATIVE
Glucose, UA: NEGATIVE mg/dL
HGB URINE DIPSTICK: NEGATIVE
Ketones, ur: NEGATIVE mg/dL
NITRITE: NEGATIVE
PH: 5 (ref 5.0–8.0)
Protein, ur: NEGATIVE mg/dL
SPECIFIC GRAVITY, URINE: 1.006 (ref 1.005–1.030)
WBC, UA: >50 WBC/hpf — ABNORMAL HIGH (ref 0–5)

## 2018-02-28 LAB — IRON AND TIBC
IRON: 203 ug/dL — AB (ref 45–182)
SATURATION RATIOS: 72 % — AB (ref 17.9–39.5)
TIBC: 280 ug/dL (ref 250–450)
UIBC: 77 ug/dL

## 2018-02-28 LAB — BASIC METABOLIC PANEL
Anion gap: 11 (ref 5–15)
BUN: 52 mg/dL — ABNORMAL HIGH (ref 6–20)
CALCIUM: 8.2 mg/dL — AB (ref 8.9–10.3)
CO2: 22 mmol/L (ref 22–32)
CREATININE: 3.66 mg/dL — AB (ref 0.61–1.24)
Chloride: 107 mmol/L (ref 98–111)
GFR, EST AFRICAN AMERICAN: 20 mL/min — AB (ref >60)
GFR, EST NON AFRICAN AMERICAN: 18 mL/min — AB (ref >60)
Glucose, Bld: 199 mg/dL — ABNORMAL HIGH (ref 70–99)
Potassium: 3.8 mmol/L (ref 3.5–5.1)
SODIUM: 140 mmol/L (ref 135–145)

## 2018-02-28 LAB — CHLAMYDIA/NGC RT PCR (ARMC ONLY)
Chlamydia Tr: NOT DETECTED
N GONORRHOEAE: NOT DETECTED

## 2018-02-28 LAB — RETICULOCYTES
Immature Retic Fract: 8.9 % (ref 2.3–15.9)
RBC.: 2.35 MIL/uL — ABNORMAL LOW (ref 4.22–5.81)
RETIC CT PCT: 0.9 % (ref 0.4–3.1)
Retic Count, Absolute: 20 10*3/uL (ref 19.0–186.0)

## 2018-02-28 LAB — CBC
HCT: 19.6 % — ABNORMAL LOW (ref 39.0–52.0)
HEMOGLOBIN: 6.5 g/dL — AB (ref 13.0–17.0)
MCH: 28.6 pg (ref 26.0–34.0)
MCHC: 33.2 g/dL (ref 30.0–36.0)
MCV: 86.3 fL (ref 80.0–100.0)
Platelets: 195 10*3/uL (ref 150–400)
RBC: 2.27 MIL/uL — ABNORMAL LOW (ref 4.22–5.81)
RDW: 14.4 % (ref 11.5–15.5)
WBC: 3.2 10*3/uL — AB (ref 4.0–10.5)
nRBC: 0 % (ref 0.0–0.2)

## 2018-02-28 LAB — GLUCOSE, CAPILLARY: GLUCOSE-CAPILLARY: 118 mg/dL — AB (ref 70–99)

## 2018-02-28 LAB — FOLATE: Folate: 10.9 ng/mL (ref >5.9)

## 2018-02-28 LAB — PREPARE RBC (CROSSMATCH)

## 2018-02-28 MED ORDER — SODIUM CHLORIDE 0.9 % IV SOLN
1.0000 g | Freq: Once | INTRAVENOUS | Status: AC
  Administered 2018-02-28: 1 g via INTRAVENOUS
  Filled 2018-02-28: qty 10

## 2018-02-28 MED ORDER — INSULIN ASPART 100 UNIT/ML ~~LOC~~ SOLN
0.0000 [IU] | Freq: Three times a day (TID) | SUBCUTANEOUS | Status: DC
  Administered 2018-03-01: 3 [IU] via SUBCUTANEOUS
  Administered 2018-03-01: 2 [IU] via SUBCUTANEOUS
  Administered 2018-03-01: 3 [IU] via SUBCUTANEOUS
  Administered 2018-03-02: 2 [IU] via SUBCUTANEOUS
  Administered 2018-03-02: 3 [IU] via SUBCUTANEOUS
  Filled 2018-02-28 (×5): qty 1

## 2018-02-28 MED ORDER — PREDNISONE 20 MG PO TABS
20.0000 mg | ORAL_TABLET | Freq: Every evening | ORAL | Status: DC
  Administered 2018-02-28 – 2018-03-01 (×2): 20 mg via ORAL
  Filled 2018-02-28 (×2): qty 1

## 2018-02-28 MED ORDER — POTASSIUM CHLORIDE CRYS ER 20 MEQ PO TBCR
20.0000 meq | EXTENDED_RELEASE_TABLET | Freq: Two times a day (BID) | ORAL | Status: DC
  Administered 2018-02-28 – 2018-03-02 (×4): 20 meq via ORAL
  Filled 2018-02-28 (×4): qty 1

## 2018-02-28 MED ORDER — ONDANSETRON HCL 4 MG/2ML IJ SOLN: 4.0000 mg | Freq: Four times a day (QID) | INTRAMUSCULAR | Status: DC | PRN

## 2018-02-28 MED ORDER — TACROLIMUS 1 MG PO CAPS
3.0000 mg | ORAL_CAPSULE | Freq: Two times a day (BID) | ORAL | Status: DC
  Administered 2018-02-28 – 2018-03-02 (×4): 3 mg via ORAL
  Filled 2018-02-28 (×5): qty 3

## 2018-02-28 MED ORDER — ATENOLOL 50 MG PO TABS
50.0000 mg | ORAL_TABLET | Freq: Every day | ORAL | Status: DC
  Administered 2018-03-01 – 2018-03-02 (×2): 50 mg via ORAL
  Filled 2018-02-28 (×2): qty 1

## 2018-02-28 MED ORDER — ACETAMINOPHEN 325 MG PO TABS: 650.0000 mg | ORAL_TABLET | Freq: Four times a day (QID) | ORAL | Status: DC | PRN

## 2018-02-28 MED ORDER — CALCIUM CARBONATE ANTACID 500 MG PO CHEW
1.0000 | CHEWABLE_TABLET | Freq: Every day | ORAL | Status: DC
  Administered 2018-03-01 – 2018-03-02 (×2): 200 mg via ORAL
  Filled 2018-02-28 (×2): qty 1

## 2018-02-28 MED ORDER — FINASTERIDE 5 MG PO TABS
5.0000 mg | ORAL_TABLET | Freq: Every day | ORAL | Status: DC
  Administered 2018-03-01 – 2018-03-02 (×2): 5 mg via ORAL
  Filled 2018-02-28 (×2): qty 1

## 2018-02-28 MED ORDER — AMLODIPINE BESYLATE 10 MG PO TABS
10.0000 mg | ORAL_TABLET | Freq: Every day | ORAL | Status: DC
  Administered 2018-03-01 – 2018-03-02 (×2): 10 mg via ORAL
  Filled 2018-02-28 (×2): qty 1

## 2018-02-28 MED ORDER — DOXAZOSIN MESYLATE 8 MG PO TABS
8.0000 mg | ORAL_TABLET | Freq: Every evening | ORAL | Status: DC
  Administered 2018-02-28 – 2018-03-01 (×2): 8 mg via ORAL
  Filled 2018-02-28 (×3): qty 1

## 2018-02-28 MED ORDER — INSULIN ASPART 100 UNIT/ML ~~LOC~~ SOLN: 0.0000 [IU] | Freq: Every day | SUBCUTANEOUS | Status: DC

## 2018-02-28 MED ORDER — CALCITRIOL 0.25 MCG PO CAPS
0.5000 ug | ORAL_CAPSULE | Freq: Two times a day (BID) | ORAL | Status: DC
  Administered 2018-02-28 – 2018-03-02 (×4): 0.5 ug via ORAL
  Filled 2018-02-28 (×5): qty 2

## 2018-02-28 MED ORDER — ACETAMINOPHEN 650 MG RE SUPP: 650.0000 mg | Freq: Four times a day (QID) | RECTAL | Status: DC | PRN

## 2018-02-28 MED ORDER — FAMOTIDINE 20 MG PO TABS
20.0000 mg | ORAL_TABLET | Freq: Every evening | ORAL | Status: DC
  Administered 2018-02-28 – 2018-03-01 (×2): 20 mg via ORAL
  Filled 2018-02-28 (×2): qty 1

## 2018-02-28 MED ORDER — FUROSEMIDE 40 MG PO TABS
80.0000 mg | ORAL_TABLET | Freq: Every day | ORAL | Status: DC
  Administered 2018-03-01 – 2018-03-02 (×2): 80 mg via ORAL
  Filled 2018-02-28 (×2): qty 2

## 2018-02-28 MED ORDER — SODIUM CHLORIDE 0.9 % IV SOLN
10.0000 mL/h | Freq: Once | INTRAVENOUS | Status: AC
  Administered 2018-03-01: 10 mL/h via INTRAVENOUS

## 2018-02-28 MED ORDER — MYCOPHENOLATE MOFETIL 250 MG PO CAPS
1000.0000 mg | ORAL_CAPSULE | Freq: Two times a day (BID) | ORAL | Status: DC
  Administered 2018-02-28 – 2018-03-02 (×4): 1000 mg via ORAL
  Filled 2018-02-28 (×5): qty 4

## 2018-02-28 MED ORDER — ONDANSETRON HCL 4 MG PO TABS: 4.0000 mg | ORAL_TABLET | Freq: Four times a day (QID) | ORAL | Status: DC | PRN

## 2018-02-28 MED ORDER — METFORMIN HCL 500 MG PO TABS
500.0000 mg | ORAL_TABLET | Freq: Two times a day (BID) | ORAL | Status: DC
  Administered 2018-03-01: 500 mg via ORAL
  Filled 2018-02-28: qty 1

## 2018-02-28 MED ORDER — CINACALCET HCL 30 MG PO TABS
30.0000 mg | ORAL_TABLET | Freq: Every day | ORAL | Status: DC
  Administered 2018-02-28 – 2018-03-01 (×2): 30 mg via ORAL
  Filled 2018-02-28 (×3): qty 1

## 2018-02-28 MED ORDER — TACROLIMUS 1 MG PO CAPS
5.0000 mg | ORAL_CAPSULE | Freq: Two times a day (BID) | ORAL | Status: DC
  Administered 2018-02-28 – 2018-03-02 (×4): 5 mg via ORAL
  Filled 2018-02-28 (×5): qty 5

## 2018-02-28 NOTE — ED Provider Notes (Signed)
[facility] Emergency Department Provider Note    First MD Initiated Contact with Patient 02/28/18 1954     (approximate)  I have reviewed the triage vital signs and the nursing notes.   HISTORY  Chief Complaint Labwork    HPI Austin Murphy is a 52 y.o. male with history of renal transplant over 20 years ago presents to the ER from outpatient clinic due to abnormal blood work.  Patient states that he had blood work showing "low blood ".  States he has had to be admitted in the past for symptomatic anemia.  States he is having worsening exertional dyspnea and fatigue.  Denies any melena or hematochezia.  States he has been having increased urinary frequency but denies any discharge or pain.  Denies any fevers nausea or vomiting.  Not on any blood thinners.  Has been compliant with his Prograf and antirejection medications.    Past Medical History:  Diagnosis Date  . Diabetes mellitus without complication ([place])   . Hx of gout   . Hypertension   . 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 Renal disorder   . Seizures ([place])    as a child   Family History  Problem Relation Age of Onset  . Diabetes Mother   . CAD Father    Past Surgical History:  Procedure Laterality Date  . AV FISTULA PLACEMENT Right   . CYSTOSCOPY W/ URETERAL STENT PLACEMENT N/A 01/06/2018   Procedure: CYSTOSCOPY;  Surgeon: Festus Aloe, MD;  Location: [facility] ORS;  Service: Urology;  Laterality: N/A;  . NEPHRECTOMY TRANSPLANTED ORGAN    . THULIUM LASER TURP (TRANSURETHRAL RESECTION OF PROSTATE) N/A 01/06/2018   Procedure: THULIUM LASER TURP (TRANSURETHRAL RESECTION OF PROSTATE);  Surgeon: Festus Aloe, MD;  Location: [facility] ORS;  Service: Urology;  Laterality: N/A;  ONLY NEEDS 90 MIN TOTAL   Patient Active Problem List   Diagnosis Date Noted  . Near syncope 07/09/2014  . Renal transplant recipient 07/09/2014  . Uncontrolled hypertension 07/09/2014  . Orthostatic  hypotension 07/09/2014  . Chronic kidney disease 07/09/2014  . Diabetes mellitus, type 2 ([place]) 07/09/2014      Prior to Admission medications   Medication Sig Start Date End Date Taking? Authorizing Provider  acetaminophen (TYLENOL) 500 MG tablet Take 1,000-1,500 mg by mouth 2 (two) times daily as needed for moderate pain.    [provider]  amLODipine (NORVASC) 10 MG tablet Take 10 mg by mouth daily.    [provider]  atenolol (TENORMIN) 100 MG tablet Take 0.5 tablets (50 mg total) by mouth daily. Patient taking differently: Take 100 mg by mouth 2 (two) times daily.  07/10/14   Florencia Reasons, MD  calcium carbonate (TUMS - DOSED IN MG ELEMENTAL CALCIUM) 500 MG chewable tablet Chew 1 tablet by mouth daily.    [provider]  cephALEXin (KEFLEX) 250 MG capsule Take 1 capsule (250 mg total) by mouth at bedtime. Patient not taking: Reported on 02/28/2018 01/06/18   Festus Aloe, MD  doxazosin (CARDURA) 8 MG tablet Take 8 mg by mouth every evening.    [provider]  famotidine (PEPCID) 20 MG tablet Take 20 mg by mouth every evening.     [provider]  furosemide (LASIX) 80 MG tablet Take 1 tablet (80 mg total) by mouth daily. Patient taking differently: Take 80 mg by mouth 2 (two) times daily.  07/10/14   Florencia Reasons, MD  metFORMIN (GLUCOPHAGE) 500 MG tablet  Take by mouth 2 (two) times daily with a meal.    [provider]  mycophenolate (CELLCEPT) 250 MG capsule Take 1,000 mg by mouth 2 (two) times daily.    [provider]  potassium chloride SA (K-DUR,KLOR-CON) 20 MEQ tablet Take 20 mEq by mouth 2 (two) times daily.    [provider]  predniSONE (DELTASONE) 20 MG tablet Take 20 mg by mouth every evening.    [provider]  tacrolimus (PROGRAF) 1 MG capsule Take 3 mg by mouth 2 (two) times daily.     [provider]  tacrolimus (PROGRAF) 5 MG capsule Take 5 mg by mouth 2 (two) times daily.     [provider]  tamsulosin (FLOMAX) 0.4 MG CAPS capsule Take 1 capsule (0.4 mg total) by mouth daily. Patient not taking: Reported on 12/27/2017 07/10/14   Florencia Reasons, MD    Allergies Patient has no known allergies.    Social History Social History   Tobacco Use  . Smoking status: Former Smoker    Packs/day: 0.00    Years: 3.00    Pack years: 0.00    Types: Cigarettes    Last attempt to quit: 05/24/2004    Years since quitting: 13.7  . Smokeless tobacco: Never Used  Substance Use Topics  . Alcohol use: Not Currently    Comment: quit 8 years ago   . Drug use: No    Review of Systems Patient denies headaches, rhinorrhea, blurry vision, numbness, shortness of breath, chest pain, edema, cough, abdominal pain, nausea, vomiting, diarrhea, dysuria, fevers, rashes or hallucinations unless otherwise stated above in HPI. ____________________________________________   PHYSICAL EXAM:  VITAL SIGNS: Vitals:   02/28/18 1858  BP: 140/77  Pulse: 74  Resp: 16  Temp: 98.2 F (36.8 C)  SpO2: 100%    Constitutional: Alert and oriented.  Eyes: Conjunctivae are normal.  Head: Atraumatic. Nose: No congestion/rhinnorhea. Mouth/Throat: Mucous membranes are moist.   Neck: No stridor. Painless ROM.  Cardiovascular: Normal rate, regular rhythm. Grossly normal heart sounds.  Good peripheral circulation. Respiratory: Normal respiratory effort.  No retractions. Lungs CTAB. Gastrointestinal: Soft and nontender. No distention. No abdominal bruits. No CVA tenderness. Genitourinary:  Musculoskeletal: No lower extremity tenderness nor edema.  No joint effusions. Neurologic:  Normal speech and language. No gross focal neurologic deficits are appreciated. No facial droop Skin:  Skin is warm, dry and intact. No rash noted. Psychiatric: Mood and affect are normal. Speech and behavior are normal.  ____________________________________________   LABS (all labs ordered are listed, but only abnormal  results are displayed)  Results for orders placed or performed during the hospital encounter of 02/28/18 (from the past 24 hour(s))  CBC     Status: Abnormal   Collection Time: 02/28/18  7:06 PM  Result Value Ref Range   WBC 3.2 (L) 4.0 - 10.5 K/uL   RBC 2.27 (L) 4.22 - 5.81 MIL/uL   Hemoglobin 6.5 (L) 13.0 - 17.0 g/dL   HCT 19.6 (L) 39.0 - 52.0 %   MCV 86.3 80.0 - 100.0 fL   MCH 28.6 26.0 - 34.0 pg   MCHC 33.2 30.0 - 36.0 g/dL   RDW 14.4 11.5 - 15.5 %   Platelets 195 150 - 400 K/uL   nRBC 0.0 0.0 - 0.2 %  Basic metabolic panel     Status: Abnormal   Collection Time: 02/28/18  7:06 PM  Result Value Ref Range   Sodium 140 135 - 145 mmol/L  Potassium 3.8 3.5 - 5.1 mmol/L   Chloride 107 98 - 111 mmol/L   CO2 22 22 - 32 mmol/L   Glucose, Bld 199 (H) 70 - 99 mg/dL   BUN 52 (H) 6 - 20 mg/dL   Creatinine, Ser 3.66 (H) 0.61 - 1.24 mg/dL   Calcium 8.2 (L) 8.9 - 10.3 mg/dL   GFR calc non Af Amer 18 (L) >60 mL/min   GFR calc Af Amer 20 (L) >60 mL/min   Anion gap 11 5 - 15  Type and screen [facility]     Status: None (Preliminary result)   Collection Time: 02/28/18  7:07 PM  Result Value Ref Range   ABO/RH(D) PENDING    Antibody Screen PENDING    Sample Expiration      03/03/2018 Performed at [facility], [address] [place], [place] [address]   Urinalysis, Complete w Microscopic     Status: Abnormal   Collection Time: 02/28/18  7:11 PM  Result Value Ref Range   Color, Urine COLORLESS (A) YELLOW   APPearance CLEAR (A) CLEAR   Specific Gravity, Urine 1.006 1.005 - 1.030   pH 5.0 5.0 - 8.0   Glucose, UA NEGATIVE NEGATIVE mg/dL   Hgb urine dipstick NEGATIVE NEGATIVE   Bilirubin Urine NEGATIVE NEGATIVE   Ketones, ur NEGATIVE NEGATIVE mg/dL   Protein, ur NEGATIVE NEGATIVE mg/dL   Nitrite NEGATIVE NEGATIVE   Leukocytes, UA MODERATE (A) NEGATIVE   RBC / HPF 0-5 0 - 5 RBC/hpf   WBC, UA >50 (H) 0 - 5 WBC/hpf   Bacteria, UA RARE (A) NONE SEEN    Squamous Epithelial / LPF 0-5 0 - 5   WBC Clumps PRESENT    Mucus PRESENT    ____________________________________________   ____________________________________________  RADIOLOGY   ____________________________________________   PROCEDURES  Procedure(s) performed:  Procedures    Critical Care performed: no ____________________________________________   INITIAL IMPRESSION / ASSESSMENT AND PLAN / ED COURSE  Pertinent labs & imaging results that were available during my care of the patient were reviewed by me and considered in my medical decision making (see chart for details).   DDX: AKI, anemia, renal insufficiency, which light abnormality, UTI  Kou Mcnair is a 52 y.o. who presents to the ED with symptoms as described above.  Patient with evidence of acute symptomatic anemia.  No signs or symptoms of GI bleed.  Likely secondary to chronic disc kidney disease.  Will order blood transfusion.  Patient also with evidence of May leukocytes and white blood cells with bacteria in his urine.  Will send for culture and give dose of Rocephin.  Given his AKI acute anemia requiring transfusion and concern for cystitis in the setting of renal transplant reviewed with patient will be best served with admission the hospital for further medical management.      As part of my medical decision making, I reviewed the following data within the [facility] notes reviewed and incorporated, Labs reviewed, notes from prior ED visits and [place] Controlled Substance Database   ____________________________________________   FINAL CLINICAL IMPRESSION(S) / ED DIAGNOSES  Final diagnoses:  Anemia due to chronic kidney disease, unspecified CKD stage  Cystitis  Symptomatic anemia  AKI (acute kidney injury) ([place])      NEW MEDICATIONS STARTED DURING THIS VISIT:  New Prescriptions   No medications on file     Note:  This document was prepared using Dragon voice  recognition software and may include  unintentional dictation errors.    Merlyn Lot, MD 02/28/18 2012

## 2018-02-28 NOTE — ED Notes (Signed)
Georgina Peer RN, aware of bed assigned

## 2018-02-28 NOTE — ED Notes (Signed)
Hospitalist in room at this time. VSS. NAD.

## 2018-02-28 NOTE — ED Triage Notes (Signed)
Pt was called today by [facility] that he needed to go to the ED because his blood work today showed "low blood." Pt had a kidney transplant 21 years ago and minimal issues ever since. Denies any pain, appears in NAD.

## 2018-02-28 NOTE — ED Notes (Signed)
Report called VSS. NAD.

## 2018-02-28 NOTE — H&P (Signed)
[facility] [place] NAME: Austin Murphy    MR#:  [id number]  DATE OF BIRTH:  10-Apr-1966  DATE OF ADMISSION:  02/28/2018  PRIMARY CARE PHYSICIAN: Estanislado Emms, MD   REQUESTING/REFERRING PHYSICIAN: Dr. Merlyn Lot  CHIEF COMPLAINT:   Chief Complaint  Patient presents with  . Labwork    HISTORY OF PRESENT ILLNESS:  Austin Murphy  is a 51 y.o. male with a known history of hypertension, diabetes, history of gout, end-stage renal disease status post renal transplant almost 20 years ago was sent in from outpatient clinic due to low hemoglobin. Patient has had anemia for a long time.  His most recent hemoglobins were in the range of 8.  He denies any blood in the stools, hematemesis, melena.  Denies any hematuria.  Not on any blood thinners at home.  But lately has been feeling extremely weak and fatigued.  Hemoglobin in the outpatient set up was noted to be low and here it is at 6.5 today.  Patient thinks he has had endoscopy and colonoscopy done in the past at [facility], but unable to locate these records.  2 units of blood has been ordered in the emergency room for symptomatic anemia.  His iron levels are pending.  PAST MEDICAL HISTORY:   Past Medical History:  Diagnosis Date  . Diabetes mellitus without complication ([place])   . Hx of gout   . Hypertension   . 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 Renal disorder   . Seizures ([place])    as a child    PAST SURGICAL HISTORY:   Past Surgical History:  Procedure Laterality Date  . AV FISTULA PLACEMENT Right   . CYSTOSCOPY W/ URETERAL STENT PLACEMENT N/A 01/06/2018   Procedure: CYSTOSCOPY;  Surgeon: Festus Aloe, MD;  Location: [facility];  Service: Urology;  Laterality: N/A;  . NEPHRECTOMY TRANSPLANTED ORGAN    . THULIUM LASER TURP (TRANSURETHRAL RESECTION OF PROSTATE) N/A 01/06/2018   Procedure: THULIUM LASER TURP (TRANSURETHRAL RESECTION OF PROSTATE);  Surgeon:  Festus Aloe, MD;  Location: [facility];  Service: Urology;  Laterality: N/A;  ONLY NEEDS 90 MIN TOTAL    SOCIAL HISTORY:   Social History   Tobacco Use  . Smoking status: Former Smoker    Packs/day: 0.00    Years: 3.00    Pack years: 0.00    Types: Cigarettes    Last attempt to quit: 05/24/2004    Years since quitting: 13.7  . Smokeless tobacco: Never Used  Substance Use Topics  . Alcohol use: Not Currently    Comment: quit 8 years ago     FAMILY HISTORY:   Family History  Problem Relation Age of Onset  . Diabetes Mother   . CAD Father     DRUG ALLERGIES:  No Known Allergies  REVIEW OF SYSTEMS:   Review of Systems  Constitutional: Positive for malaise/fatigue. Negative for chills, fever and weight loss.  HENT: Negative for ear discharge, ear pain, hearing loss and nosebleeds.   Eyes: Negative for blurred vision, double vision and photophobia.  Respiratory: Negative for cough, hemoptysis, shortness of breath and wheezing.   Cardiovascular: Positive for leg swelling. Negative for chest pain, palpitations and orthopnea.  Gastrointestinal: Negative for abdominal pain, constipation, diarrhea, melena, nausea and vomiting.  Genitourinary: Negative for dysuria, frequency and urgency.  Musculoskeletal: Positive for myalgias. Negative for back pain and neck pain.  Skin: Negative for rash.  Neurological:  Positive for dizziness and weakness. Negative for tingling, tremors, sensory change, speech change, focal weakness and headaches.  Endo/Heme/Allergies: Does not bruise/bleed easily.  Psychiatric/Behavioral: Negative for depression.    MEDICATIONS AT HOME:   Prior to Admission medications   Medication Sig Start Date End Date Taking? Authorizing Provider  acetaminophen (TYLENOL) 500 MG tablet Take 1,000-1,500 mg by mouth 2 (two) times daily as needed for moderate pain.   Yes [provider]  amLODipine (NORVASC) 10 MG tablet Take 10 mg by mouth daily.   Yes [provider]  atenolol (TENORMIN) 100 MG tablet Take 0.5 tablets (50 mg total) by mouth daily. Patient taking differently: Take 100 mg by mouth 2 (two) times daily.  07/10/14  Yes Florencia Reasons, MD  calcitRIOL (ROCALTROL) 0.5 MCG capsule Take 0.5 mcg by mouth 2 (two) times daily.   Yes [provider]  calcium carbonate (TUMS - DOSED IN MG ELEMENTAL CALCIUM) 500 MG chewable tablet Chew 1 tablet by mouth daily.   Yes [provider]  cinacalcet (SENSIPAR) 30 MG tablet Take 30 mg by mouth daily. With the largest meal of the day   Yes [provider]  colchicine 0.6 MG tablet Take 0.6-1.2 mg by mouth daily as needed (for gout flare-ups).   Yes [provider]  doxazosin (CARDURA) 8 MG tablet Take 8 mg by mouth every evening.   Yes [provider]  famotidine (PEPCID) 20 MG tablet Take 20 mg by mouth every evening.    Yes [provider]  finasteride (PROSCAR) 5 MG tablet Take 5 mg by mouth daily.   Yes [provider]  furosemide (LASIX) 80 MG tablet Take 1 tablet (80 mg total) by mouth daily. Patient taking differently: Take 80 mg by mouth 2 (two) times daily.  07/10/14  Yes Florencia Reasons, MD  metFORMIN (GLUCOPHAGE) 500 MG tablet Take 500 mg by mouth 2 (two) times daily with a meal.    Yes [provider]  mycophenolate (CELLCEPT) 250 MG capsule Take 1,000 mg by mouth 2 (two) times daily.   Yes [provider]  potassium chloride SA (K-DUR,KLOR-CON) 20 MEQ tablet Take 20 mEq by mouth 2 (two) times daily.   Yes [provider]  predniSONE (DELTASONE) 20 MG tablet Take 20 mg by mouth every evening.   Yes [provider]  tacrolimus (PROGRAF) 1 MG capsule Take 3 mg by mouth 2 (two) times daily.    Yes [provider]  tacrolimus (PROGRAF) 5 MG capsule Take 5 mg by mouth 2 (two) times daily.    Yes [provider]  cephALEXin (KEFLEX) 250 MG capsule Take 1 capsule (250 mg total) by mouth at  bedtime. Patient not taking: Reported on 02/28/2018 01/06/18   Festus Aloe, MD  tamsulosin (FLOMAX) 0.4 MG CAPS capsule Take 1 capsule (0.4 mg total) by mouth daily. Patient not taking: Reported on 12/27/2017 07/10/14   Florencia Reasons, MD      VITAL SIGNS:  Blood pressure 140/77, pulse 74, temperature 98.2 F (36.8 C), temperature source Oral, resp. rate 16, height 5\' 6"  (1.676 m), weight 72.6 kg, SpO2 100 %.  PHYSICAL EXAMINATION:   Physical Exam  GENERAL:  52 y.o.-year-old patient lying in the bed with no acute distress.  EYES: Pupils equal, round, reactive to light and accommodation. No scleral icterus. Extraocular muscles intact.  HEENT: Head atraumatic, normocephalic. Oropharynx and nasopharynx clear.  NECK:  Supple, no jugular venous distention. No thyroid enlargement, no tenderness.  LUNGS:  Normal breath sounds bilaterally, no wheezing, rales,rhonchi or crepitation. No use of accessory muscles of respiration.  CARDIOVASCULAR: S1, S2 normal. No  rubs, or gallops. 2/6 systolic murmur present ABDOMEN: Soft, nontender, nondistended. Bowel sounds present. No organomegaly or mass.  EXTREMITIES: No pedal edema, cyanosis, or clubbing.  NEUROLOGIC: Cranial nerves II through XII are intact. Muscle strength 5/5 in all extremities. Sensation intact. Gait not checked.  PSYCHIATRIC: The patient is alert and oriented x 3.  SKIN: No obvious rash, lesion, or ulcer.   LABORATORY PANEL:   CBC Recent Labs  Lab 02/28/18 1906  WBC 3.2*  HGB 6.5*  HCT 19.6*  PLT 195   ------------------------------------------------------------------------------------------------------------------  Chemistries  Recent Labs  Lab 02/28/18 1906  NA 140  K 3.8  CL 107  CO2 22  GLUCOSE 199*  BUN 52*  CREATININE 3.66*  CALCIUM 8.2*   ------------------------------------------------------------------------------------------------------------------  Cardiac Enzymes No results for input(s): TROPONINI in the  last 168 hours. ------------------------------------------------------------------------------------------------------------------  RADIOLOGY:  No results found.  EKG:   Orders placed or performed during the hospital encounter of 12/30/17  . EKG 12 lead  . EKG 12 lead    IMPRESSION AND PLAN:   Austin Murphy  is a 52 y.o. male with a known history of hypertension, diabetes, history of gout, end-stage renal disease status post renal transplant almost 20 years ago was sent in from outpatient clinic due to low hemoglobin.  1.  Acute on chronic anemia-anemia labs are pending. -2 units ordered for symptomatic anemia -Iron levels are low, will need iron infusion and also likely GI follow-up -Repeat hemoglobin in a.m.  2.  Acute on chronic renal failure-patient has had ESRD and was on dialysis for 6 years before got renal  Transplant about 20 years ago -Continue transplant medications -Decrease the dose of diuretics.  Nephrology consulted  3.  BPH-follows up with urology.  Recent cystoscopy and thulium laser vaporization of the prostate. -Continue medications  4.  Diabetes mellitus-on metformin.  Sliding scale insulin  5.  DVT prophylaxis-teds and SCDs only due to anemia  Ambulatory at baseline.   All the records are reviewed and case discussed with ED provider. Management plans discussed with the patient, family and they are in agreement.  CODE STATUS: Full Code  TOTAL TIME TAKING CARE OF THIS PATIENT: 50 minutes.    Gladstone Lighter M.D on 02/28/2018 at 8:32 PM  Between 7am to 6pm - Pager - [phone]  After 6pm go to [email or web address] - password [email or web address] [facility]  Office  [phone]  CC: Primary care physician; Estanislado Emms, MD

## 2018-03-01 LAB — CBC
HCT: 23.2 % — ABNORMAL LOW (ref 39.0–52.0)
Hemoglobin: 7.7 g/dL — ABNORMAL LOW (ref 13.0–17.0)
MCH: 28.4 pg (ref 26.0–34.0)
MCHC: 33.2 g/dL (ref 30.0–36.0)
MCV: 85.6 fL (ref 80.0–100.0)
PLATELETS: 168 10*3/uL (ref 150–400)
RBC: 2.71 MIL/uL — ABNORMAL LOW (ref 4.22–5.81)
RDW: 13.9 % (ref 11.5–15.5)
WBC: 3.6 10*3/uL — ABNORMAL LOW (ref 4.0–10.5)
nRBC: 0 % (ref 0.0–0.2)

## 2018-03-01 LAB — BASIC METABOLIC PANEL
Anion gap: 11 (ref 5–15)
BUN: 46 mg/dL — AB (ref 6–20)
CALCIUM: 7.9 mg/dL — AB (ref 8.9–10.3)
CO2: 20 mmol/L — ABNORMAL LOW (ref 22–32)
Chloride: 110 mmol/L (ref 98–111)
Creatinine, Ser: 3.35 mg/dL — ABNORMAL HIGH (ref 0.61–1.24)
GFR calc Af Amer: 23 mL/min — ABNORMAL LOW (ref >60)
GFR, EST NON AFRICAN AMERICAN: 19 mL/min — AB (ref >60)
GLUCOSE: 248 mg/dL — AB (ref 70–99)
Potassium: 4.3 mmol/L (ref 3.5–5.1)
Sodium: 141 mmol/L (ref 135–145)

## 2018-03-01 LAB — GLUCOSE, CAPILLARY
GLUCOSE-CAPILLARY: 161 mg/dL — AB (ref 70–99)
Glucose-Capillary: 237 mg/dL — ABNORMAL HIGH (ref 70–99)
Glucose-Capillary: 205 mg/dL — ABNORMAL HIGH (ref 70–99)
Glucose-Capillary: 178 mg/dL — ABNORMAL HIGH (ref 70–99)

## 2018-03-01 LAB — ABO/RH: ABO/RH(D): O NEG

## 2018-03-01 LAB — VITAMIN B12: VITAMIN B 12: 459 pg/mL (ref 180–914)

## 2018-03-01 NOTE — Progress Notes (Addendum)
Inpatient Diabetes Program Recommendations  AACE/ADA: New Consensus Statement on Inpatient Glycemic Control (2015)  Target Ranges:  Prepandial:   less than 140 mg/dL      Peak postprandial:   less than 180 mg/dL (1-2 hours)      Critically ill patients:  140 - 180 mg/dL   Lab Results  Component Value Date   GLUCAP 205 (H) 03/01/2018   HGBA1C 6.2 (H) 12/30/2017    Review of Glycemic ControlResults for CONNELLY, [place] (MRN [id number]) as of 03/01/2018 14:30  Ref. Range 03/01/2018 07:51 03/01/2018 11:30  Glucose-Capillary Latest Ref Range: 70 - 99 mg/dL 237 (H) 205 (H)   Diabetes history: Type 2 DM  Outpatient Diabetes medications: Metformin 500 mg bid Current orders for Inpatient glycemic control: Novolog sensitive tid with meals and HS Prednisone 20 mg q PM Inpatient Diabetes Program Recommendations:   Note blood sugars increased.  If fasting CBG's remain>180 mg/dL, consider adding Levemir 6 units q HS.   Thanks,  Adah Perl, RN, BC-ADM Inpatient Diabetes Coordinator Pager [phone] (8a-5p)

## 2018-03-01 NOTE — Progress Notes (Signed)
[facility] at [place] NAME: Austin Murphy    MR#:  [id number]  DATE OF BIRTH:  14-Sep-1965  SUBJECTIVE:  CHIEF COMPLAINT:   Chief Complaint  Patient presents with  . Labwork  Patient seen and evaluated today Tolerated PRBC transfusion well No chest pain and shortness of breath  REVIEW OF SYSTEMS:    ROS  CONSTITUTIONAL: No documented fever. Has fatigue, weakness. No weight gain, no weight loss.  EYES: No blurry or double vision.  ENT: No tinnitus. No postnasal drip. No redness of the oropharynx.  RESPIRATORY: No cough, no wheeze, no hemoptysis. No dyspnea.  CARDIOVASCULAR: No chest pain. No orthopnea. No palpitations. No syncope.  GASTROINTESTINAL: No nausea, no vomiting or diarrhea. No abdominal pain. No melena or hematochezia.  GENITOURINARY: No dysuria or hematuria.  ENDOCRINE: No polyuria or nocturia. No heat or cold intolerance.  HEMATOLOGY: No anemia. No bruising. No bleeding.  INTEGUMENTARY: No rashes. No lesions.  MUSCULOSKELETAL: No arthritis. No swelling. No gout.  NEUROLOGIC: No numbness, tingling, or ataxia. No seizure-type activity.  PSYCHIATRIC: No anxiety. No insomnia. No ADD.   DRUG ALLERGIES:  No Known Allergies  VITALS:  Blood pressure 114/70, pulse 79, temperature 98.4 F (36.9 C), temperature source Oral, resp. rate 15, height 5\' 6"  (1.676 m), weight 74.7 kg, SpO2 100 %.  PHYSICAL EXAMINATION:   Physical Exam  GENERAL:  52 y.o.-year-old patient lying in the bed with no acute distress.  EYES: Pupils equal, round, reactive to light and accommodation. No scleral icterus. Extraocular muscles intact.  HEENT: Head atraumatic, normocephalic. Oropharynx and nasopharynx clear.  Pallor present NECK:  Supple, no jugular venous distention. No thyroid enlargement, no tenderness.  LUNGS: Normal breath sounds bilaterally, no wheezing, rales, rhonchi. No use of accessory muscles of respiration.  CARDIOVASCULAR: S1, S2  normal. No murmurs, rubs, or gallops.  ABDOMEN: Soft, nontender, nondistended. Bowel sounds present. No organomegaly or mass.  EXTREMITIES: No cyanosis, clubbing or edema b/l.    NEUROLOGIC: Cranial nerves II through XII are intact. No focal Motor or sensory deficits b/l.   PSYCHIATRIC: The patient is alert and oriented x 3.  SKIN: No obvious rash, lesion, or ulcer.   LABORATORY PANEL:   CBC Recent Labs  Lab 03/01/18 0500  WBC 3.6*  HGB 7.7*  HCT 23.2*  PLT 168   ------------------------------------------------------------------------------------------------------------------ Chemistries  Recent Labs  Lab 03/01/18 0500  NA 141  K 4.3  CL 110  CO2 20*  GLUCOSE 248*  BUN 46*  CREATININE 3.35*  CALCIUM 7.9*   ------------------------------------------------------------------------------------------------------------------  Cardiac Enzymes No results for input(s): TROPONINI in the last 168 hours. ------------------------------------------------------------------------------------------------------------------  RADIOLOGY:  No results found.   ASSESSMENT AND PLAN:   [age]-year-old male patient with history of hypertension, diabetes mellitus type 2, end-stage renal disease on dialysis, renal transplant 20 years ago, gout currently under hospitalist service for low hemoglobin  -Symptomatic anemia Status post PRBC transfusion Patient states he had endoscopy and colonoscopy done in the past we will try to obtain records Monitor hemoglobin hematocrit Iron levels checked Status post nephrology evaluation  -Chronic kidney disease stage IV Status post renal transplant Continue immunosuppression meds Appreciate nephrology follow-up  -Type 2 diabetes mellitus Sliding scale coverage with insulin and diabetic diet  -DVT prophylaxis sequential compression device to lower extremities  -Secondary hyperparathyroidism Status post parathyroidectomy Continue calcitriol and  cinacalcet  All the records are reviewed and case discussed with Care Management/Social Worker. Management plans discussed with the patient, family  and they are in agreement.  CODE STATUS: Full code  DVT Prophylaxis: SCDs  TOTAL TIME TAKING CARE OF THIS PATIENT: 32 minutes.   POSSIBLE D/C IN  1 to 2 DAYS, DEPENDING ON CLINICAL CONDITION.  Saundra Shelling M.D on 03/01/2018 at 11:35 AM  Between 7am to 6pm - Pager - [phone]  After 6pm go to [email or web address] - password [email or web address] [facility]  Office  [phone]  CC: Primary care physician; Estanislado Emms, MD  Note: This dictation was prepared with Dragon dictation along with smaller phrase technology. Any transcriptional errors that result from this process are unintentional.

## 2018-03-01 NOTE — Progress Notes (Signed)
Advanced care plan.  Purpose of the Encounter: CODE STATUS  Parties in Attendance: Patient  Patient's Decision Capacity: Good  Subjective/Patient's story: Presented to the emergency room for abnormal blood work   Objective/Medical story Has severe symptomatic anemia Needs PRBC transfusion   Goals of care determination:  Advance care directives and goals of care discussed Patient wants everything done which includes CPR, intubation and ventilator if the need arises   CODE STATUS: Full code   Time spent discussing advanced care planning: 16 minutes

## 2018-03-01 NOTE — Consult Note (Signed)
[facility]  CONSULT NOTE    Date: 03/01/2018                  Patient Name:  Austin Murphy  MRN: [id number]  DOB: 16-Aug-1965  Age / Sex: 52 y.o., male         PCP: Estanislado Emms, MD                 Service Requesting Consult: Dr. Tressia Miners                 Reason for Consult: Renal Transplant            History of Present Illness: Austin Murphy is a 52 y.o. black male was admitted to [facility] on 02/28/2018 for anemia. Found to have hemoglobin of 6.5. Received 2 units PRBC last night. Patient states he is feeling better. Denies any hematuria, hematochezia, hematemesis or melana. Patient endorses no use of blood thinners or NSAIDs.   Patient endorses shortness of breath, dyspnea on exertion that has been progressive for last few months.   He went to see his nephrologist yesterday where a hemoglobin of 6.4.    Medications: Outpatient medications: Medications Prior to Admission  Medication Sig Dispense Refill Last Dose  . acetaminophen (TYLENOL) 500 MG tablet Take 1,000-1,500 mg by mouth 2 (two) times daily as needed for moderate pain.   unknown at unknown  . amLODipine (NORVASC) 10 MG tablet Take 10 mg by mouth daily.   02/28/2018 at am  . atenolol (TENORMIN) 100 MG tablet Take 0.5 tablets (50 mg total) by mouth daily. (Patient taking differently: Take 100 mg by mouth 2 (two) times daily. ) 30 tablet 0 02/28/2018 at 0800  . calcitRIOL (ROCALTROL) 0.5 MCG capsule Take 0.5 mcg by mouth 2 (two) times daily.   02/28/2018 at am  . calcium carbonate (TUMS - DOSED IN MG ELEMENTAL CALCIUM) 500 MG chewable tablet Chew 1 tablet by mouth daily.   02/28/2018 at am  . cinacalcet (SENSIPAR) 30 MG tablet Take 30 mg by mouth daily. With the largest meal of the day   02/27/2018 at pm  . colchicine 0.6 MG tablet Take 0.6-1.2 mg by mouth daily as needed (for gout flare-ups).   unknown at unknown  . doxazosin (CARDURA) 8 MG tablet Take 8 mg by mouth every evening.   02/27/2018 at pm   . famotidine (PEPCID) 20 MG tablet Take 20 mg by mouth every evening.    02/27/2018 at pm  . finasteride (PROSCAR) 5 MG tablet Take 5 mg by mouth daily.   02/28/2018 at am  . furosemide (LASIX) 80 MG tablet Take 1 tablet (80 mg total) by mouth daily. (Patient taking differently: Take 80 mg by mouth 2 (two) times daily. ) 30 tablet 0 02/28/2018 at am  . metFORMIN (GLUCOPHAGE) 500 MG tablet Take 500 mg by mouth 2 (two) times daily with a meal.    02/28/2018 at am  . mycophenolate (CELLCEPT) 250 MG capsule Take 1,000 mg by mouth 2 (two) times daily.   02/28/2018 at am  . potassium chloride SA (K-DUR,KLOR-CON) 20 MEQ tablet Take 20 mEq by mouth 2 (two) times daily.   02/28/2018 at am  . predniSONE (DELTASONE) 20 MG tablet Take 20 mg by mouth every evening.   02/27/2018 at pm  . tacrolimus (PROGRAF) 1 MG capsule Take 3 mg by mouth 2 (two) times daily.    02/28/2018 at am  . tacrolimus (PROGRAF) 5 MG capsule  Take 5 mg by mouth 2 (two) times daily.    02/28/2018 at am  . cephALEXin (KEFLEX) 250 MG capsule Take 1 capsule (250 mg total) by mouth at bedtime. (Patient not taking: Reported on 02/28/2018) 7 capsule 0 Not Taking at Unknown time  . tamsulosin (FLOMAX) 0.4 MG CAPS capsule Take 1 capsule (0.4 mg total) by mouth daily. (Patient not taking: Reported on 12/27/2017) 30 capsule 0 Not Taking at Unknown time    Current medications: Current Facility-Administered Medications  Medication Dose Route Frequency Provider Last Rate Last Dose  . acetaminophen (TYLENOL) tablet 650 mg  650 mg Oral Q6H PRN Gladstone Lighter, MD       Or  . acetaminophen (TYLENOL) suppository 650 mg  650 mg Rectal Q6H PRN Gladstone Lighter, MD      . amLODipine (NORVASC) tablet 10 mg  10 mg Oral Daily Gladstone Lighter, MD   10 mg at 03/01/18 0843  . atenolol (TENORMIN) tablet 50 mg  50 mg Oral Daily Gladstone Lighter, MD   50 mg at 03/01/18 0845  . calcitRIOL (ROCALTROL) capsule 0.5 mcg  0.5 mcg Oral BID Gladstone Lighter, MD   0.5 mcg  at 03/01/18 0844  . calcium carbonate (TUMS - dosed in mg elemental calcium) chewable tablet 200 mg of elemental calcium  1 tablet Oral Daily Gladstone Lighter, MD   200 mg of elemental calcium at 03/01/18 0846  . cinacalcet (SENSIPAR) tablet 30 mg  30 mg Oral Q supper Gladstone Lighter, MD   30 mg at 02/28/18 2318  . doxazosin (CARDURA) tablet 8 mg  8 mg Oral QPM Gladstone Lighter, MD   8 mg at 02/28/18 2318  . famotidine (PEPCID) tablet 20 mg  20 mg Oral QPM Gladstone Lighter, MD   20 mg at 02/28/18 2326  . finasteride (PROSCAR) tablet 5 mg  5 mg Oral Daily Gladstone Lighter, MD   5 mg at 03/01/18 0842  . furosemide (LASIX) tablet 80 mg  80 mg Oral Daily Gladstone Lighter, MD   80 mg at 03/01/18 0842  . insulin aspart (novoLOG) injection 0-5 Units  0-5 Units Subcutaneous QHS Gladstone Lighter, MD      . insulin aspart (novoLOG) injection 0-9 Units  0-9 Units Subcutaneous TID WC Gladstone Lighter, MD   3 Units at 03/01/18 0842  . metFORMIN (GLUCOPHAGE) tablet 500 mg  500 mg Oral BID WC Gladstone Lighter, MD   500 mg at 03/01/18 0843  . mycophenolate (CELLCEPT) capsule 1,000 mg  1,000 mg Oral BID Gladstone Lighter, MD   1,000 mg at 03/01/18 0843  . ondansetron (ZOFRAN) tablet 4 mg  4 mg Oral Q6H PRN Gladstone Lighter, MD       Or  . ondansetron (ZOFRAN) injection 4 mg  4 mg Intravenous Q6H PRN Gladstone Lighter, MD      . potassium chloride SA (K-DUR,KLOR-CON) CR tablet 20 mEq  20 mEq Oral BID Gladstone Lighter, MD   20 mEq at 03/01/18 0843  . predniSONE (DELTASONE) tablet 20 mg  20 mg Oral QPM Gladstone Lighter, MD   20 mg at 02/28/18 2326  . tacrolimus (PROGRAF) capsule 3 mg  3 mg Oral BID Gladstone Lighter, MD   3 mg at 03/01/18 0844  . tacrolimus (PROGRAF) capsule 5 mg  5 mg Oral BID Gladstone Lighter, MD   5 mg at 03/01/18 0844      Allergies: No Known Allergies    Past Medical History: Past Medical History:  Diagnosis Date  . Diabetes mellitus  without complication  ([place])   . Hx of gout   . Hypertension   . 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 Renal disorder   . Seizures ([place])    as a child     Past Surgical History: Past Surgical History:  Procedure Laterality Date  . AV FISTULA PLACEMENT Right   . CYSTOSCOPY W/ URETERAL STENT PLACEMENT N/A 01/06/2018   Procedure: CYSTOSCOPY;  Surgeon: Festus Aloe, MD;  Location: [facility];  Service: Urology;  Laterality: N/A;  . NEPHRECTOMY TRANSPLANTED ORGAN    . THULIUM LASER TURP (TRANSURETHRAL RESECTION OF PROSTATE) N/A 01/06/2018   Procedure: THULIUM LASER TURP (TRANSURETHRAL RESECTION OF PROSTATE);  Surgeon: Festus Aloe, MD;  Location: [facility];  Service: Urology;  Laterality: N/A;  ONLY NEEDS 90 MIN TOTAL     Family History: Family History  Problem Relation Age of Onset  . Diabetes Mother   . CAD Father      Social History: Social History   Socioeconomic History  . Marital status: Single    Spouse name: Not on file  . Number of children: Not on file  . Years of education: Not on file  . Highest education level: Not on file  Occupational History  . Not on file  Social Needs  . Financial resource strain: Not on file  . Food insecurity:    Worry: Not on file    Inability: Not on file  . Transportation needs:    Medical: Not on file    Non-medical: Not on file  Tobacco Use  . Smoking status: Former Smoker    Packs/day: 0.00    Years: 3.00    Pack years: 0.00    Types: Cigarettes    Last attempt to quit: 05/24/2004    Years since quitting: 13.7  . Smokeless tobacco: Never Used  Substance and Sexual Activity  . Alcohol use: Not Currently    Comment: quit 8 years ago   . Drug use: No  . Sexual activity: Not on file  Lifestyle  . Physical activity:    Days per week: Not on file    Minutes per session: Not on file  . Stress: Not on file  Relationships  . Social connections:    Talks on phone: Not on file    Gets together: Not on file    Attends religious  service: Not on file    Active member of club or organization: Not on file    Attends meetings of clubs or organizations: Not on file    Relationship status: Not on file  . Intimate partner violence:    Fear of current or ex partner: Not on file    Emotionally abused: Not on file    Physically abused: Not on file    Forced sexual activity: Not on file  Other Topics Concern  . Not on file  Social History Narrative   Stays with his brother. Independent at baseline     Review of Systems: Review of Systems  Constitutional: Negative.   HENT: Negative.   Eyes: Negative.   Respiratory: Positive for shortness of breath. Negative for cough, hemoptysis, sputum production and wheezing.   Cardiovascular: Negative.   Gastrointestinal: Negative.   Genitourinary: Negative.   Musculoskeletal: Negative.   Skin: Negative.   Neurological: Negative.   Endo/Heme/Allergies: Negative.   Psychiatric/Behavioral: Negative.     Vital Signs: Blood pressure 113/68, pulse 72, temperature 98.2 F (36.8 C), temperature source Oral, resp. rate 18,  height 5\' 6"  (1.676 m), weight 74.7 kg, SpO2 100 %.  Weight trends: Filed Weights   02/28/18 1858 02/28/18 2110  Weight: 72.6 kg 74.7 kg    Physical Exam: General: NAD, laying in bed  Head: Normocephalic, atraumatic. Moist oral mucosal membranes  Eyes: Anicteric, PERRL  Neck: Supple, trachea midline  Lungs:  Clear to auscultation  Heart: Regular rate and rhythm  Abdomen:  Soft, nontender,   Extremities: no peripheral edema.  Neurologic: Nonfocal, moving all four extremities  Skin: No lesions  Access: Right arm AVG no thrill or bruit     Lab results: Basic Metabolic Panel: Recent Labs  Lab 02/28/18 1906 03/01/18 0500  NA 140 141  K 3.8 4.3  CL 107 110  CO2 22 20*  GLUCOSE 199* 248*  BUN 52* 46*  CREATININE 3.66* 3.35*  CALCIUM 8.2* 7.9*    Liver Function Tests: No results for input(s): AST, ALT, ALKPHOS, BILITOT, PROT, ALBUMIN in the  last 168 hours. No results for input(s): LIPASE, AMYLASE in the last 168 hours. No results for input(s): AMMONIA in the last 168 hours.  CBC: Recent Labs  Lab 02/28/18 1906 03/01/18 0500  WBC 3.2* 3.6*  HGB 6.5* 7.7*  HCT 19.6* 23.2*  MCV 86.3 85.6  PLT 195 168    Cardiac Enzymes: No results for input(s): CKTOTAL, CKMB, CKMBINDEX, TROPONINI in the last 168 hours.  BNP: Invalid input(s): POCBNP  CBG: Recent Labs  Lab 02/28/18 2120 03/01/18 0751  GLUCAP 118* 237*    Microbiology: Results for orders placed or performed during the hospital encounter of 02/28/18  [place] rt PCR ([place] only)     Status: None   Collection Time: 02/28/18  8:12 PM  Result Value Ref Range Status   Specimen source GC/Chlam URINE, RANDOM  Final   Chlamydia Tr NOT DETECTED NOT DETECTED Final   N gonorrhoeae NOT DETECTED NOT DETECTED Final    Comment: (NOTE) This CT/NG assay has not been evaluated in patients with a history of  hysterectomy. Performed at [facility], [address] [place], [place] [address]     Coagulation Studies: No results for input(s): LABPROT, INR in the last 72 hours.  Urinalysis: Recent Labs    02/28/18 1911  COLORURINE COLORLESS*  LABSPEC 1.006  PHURINE 5.0  GLUCOSEU NEGATIVE  HGBUR NEGATIVE  BILIRUBINUR NEGATIVE  KETONESUR NEGATIVE  PROTEINUR NEGATIVE  NITRITE NEGATIVE  LEUKOCYTESUR MODERATE*      Imaging:  No results found.   Assessment & Plan: Austin Murphy is a 52 y.o. 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 Acute renal failure on Chronic kidney disease stage IV T - Renal Transplant: baseline creatinine of 3.15, GFR of 25 on 11/08/17. - Continue immunosuppression regimen.   2. Anemia with chronic kidney disease: normocytic. Status post 2 units PRBC on 10/8. - Appreciate GI input  3. Diabetes mellitus type II with chronic kidney disease: hemoglobin A1c 6.4% - discontinue metformin.   4. Secondary Hyperparathyroidism: status post parathyroidectomy - Calcitriol and cinacalcet - Tums with meals.   5. Hypertension: blood pressure at goal. 113/68.  - furosemide, atenolol  and amlodipine.   LOS: 1 Anaiah Mcmannis 10/9/201910:20 AM

## 2018-03-02 LAB — BASIC METABOLIC PANEL
Anion gap: 9 (ref 5–15)
BUN: 48 mg/dL — ABNORMAL HIGH (ref 6–20)
CALCIUM: 8.5 mg/dL — AB (ref 8.9–10.3)
CO2: 22 mmol/L (ref 22–32)
CREATININE: 3.59 mg/dL — AB (ref 0.61–1.24)
Chloride: 107 mmol/L (ref 98–111)
GFR calc Af Amer: 21 mL/min — ABNORMAL LOW (ref >60)
GFR calc non Af Amer: 18 mL/min — ABNORMAL LOW (ref >60)
GLUCOSE: 223 mg/dL — AB (ref 70–99)
Potassium: 4.4 mmol/L (ref 3.5–5.1)
Sodium: 138 mmol/L (ref 135–145)

## 2018-03-02 LAB — GLUCOSE, CAPILLARY
Glucose-Capillary: 201 mg/dL — ABNORMAL HIGH (ref 70–99)
Glucose-Capillary: 167 mg/dL — ABNORMAL HIGH (ref 70–99)

## 2018-03-02 LAB — CBC
HEMATOCRIT: 24.2 % — AB (ref 39.0–52.0)
Hemoglobin: 8.4 g/dL — ABNORMAL LOW (ref 13.0–17.0)
MCH: 28.9 pg (ref 26.0–34.0)
MCHC: 34.7 g/dL (ref 30.0–36.0)
MCV: 83.2 fL (ref 80.0–100.0)
Platelets: 165 10*3/uL (ref 150–400)
RBC: 2.91 MIL/uL — ABNORMAL LOW (ref 4.22–5.81)
RDW: 13.7 % (ref 11.5–15.5)
WBC: 4.9 10*3/uL (ref 4.0–10.5)
nRBC: 0 % (ref 0.0–0.2)

## 2018-03-02 LAB — HIV ANTIBODY (ROUTINE TESTING W REFLEX): HIV SCREEN 4TH GENERATION: NONREACTIVE

## 2018-03-02 MED ORDER — FUROSEMIDE 40 MG PO TABS: 40.0000 mg | ORAL_TABLET | Freq: Every day | ORAL | 0 refills | Status: DC

## 2018-03-02 MED ORDER — CEPHALEXIN 500 MG PO CAPS: 500.0000 mg | ORAL_CAPSULE | Freq: Two times a day (BID) | ORAL | 0 refills | Status: AC

## 2018-03-02 NOTE — Progress Notes (Signed)
[facility]  ROUNDING NOTE   Subjective:   No complaints this morning.  Wants to go home.   Objective:  Vital signs in last 24 hours:  Temp:  [98.4 F (36.9 C)-98.6 F (37 C)] 98.4 F (36.9 C) (10/10 0617) Pulse Rate:  [65-79] 65 (10/10 0617) Resp:  [15-18] 18 (10/10 0617) BP: (114-147)/(70-89) 147/88 (10/10 0617) SpO2:  [100 %] 100 % (10/10 0617)  Weight change:  Filed Weights   02/28/18 1858 02/28/18 2110  Weight: 72.6 kg 74.7 kg    Intake/Output: I/O last 3 completed shifts: In: 780.1 [I.V.:100.1; Blood:680] Out: 2400 [Urine:2400]   Intake/Output this shift:  Total I/O In: -  Out: 375 [Urine:375]  Physical Exam: General: NAD,   Head: Normocephalic, atraumatic. Moist oral mucosal membranes  Eyes: Anicteric, PERRL  Neck: Supple, trachea midline  Lungs:  Clear to auscultation  Heart: Regular rate and rhythm  Abdomen:  Soft, nontender,   Extremities: no peripheral edema.  Neurologic: Nonfocal, moving all four extremities  Skin: No lesions  Access: AVG no bruit, no thrill    Basic Metabolic Panel: Recent Labs  Lab 02/28/18 1906 03/01/18 0500 03/02/18 0606  NA 140 141 138  K 3.8 4.3 4.4  CL 107 110 107  CO2 22 20* 22  GLUCOSE 199* 248* 223*  BUN 52* 46* 48*  CREATININE 3.66* 3.35* 3.59*  CALCIUM 8.2* 7.9* 8.5*    Liver Function Tests: No results for input(s): AST, ALT, ALKPHOS, BILITOT, PROT, ALBUMIN in the last 168 hours. No results for input(s): LIPASE, AMYLASE in the last 168 hours. No results for input(s): AMMONIA in the last 168 hours.  CBC: Recent Labs  Lab 02/28/18 1906 03/01/18 0500 03/02/18 0606  WBC 3.2* 3.6* 4.9  HGB 6.5* 7.7* 8.4*  HCT 19.6* 23.2* 24.2*  MCV 86.3 85.6 83.2  PLT 195 168 165    Cardiac Enzymes: No results for input(s): CKTOTAL, CKMB, CKMBINDEX, TROPONINI in the last 168 hours.  BNP: Invalid input(s): POCBNP  CBG: Recent Labs  Lab 03/01/18 0751 03/01/18 1130 03/01/18 1657 03/01/18 2059  03/02/18 0728  GLUCAP 237* 205* 178* 161* 201*    Microbiology: Results for orders placed or performed during the hospital encounter of 02/28/18  Urine Culture     Status: Abnormal (Preliminary result)   Collection Time: 02/28/18  7:53 PM  Result Value Ref Range Status   Specimen Description   Final    URINE, RANDOM Performed at [facility], [address], [place], [place] [address]    Special Requests   Final    NONE Performed at [facility], [address], [place], [place] [address]    Culture >=100,000 COLONIES/mL GRAM NEGATIVE RODS (A)  Final   Report Status PENDING  Incomplete  Chlamydia/NGC rt PCR ([place] only)     Status: None   Collection Time: 02/28/18  8:12 PM  Result Value Ref Range Status   Specimen source GC/Chlam URINE, RANDOM  Final   Chlamydia Tr NOT DETECTED NOT DETECTED Final   N gonorrhoeae NOT DETECTED NOT DETECTED Final    Comment: (NOTE) This CT/NG assay has not been evaluated in patients with a history of  hysterectomy. Performed at [facility], [address] [place], [place] [address]     Coagulation Studies: No results for input(s): LABPROT, INR in the last 72 hours.  Urinalysis: Recent Labs    02/28/18 1911  COLORURINE COLORLESS*  LABSPEC 1.006  PHURINE 5.0  GLUCOSEU NEGATIVE  HGBUR NEGATIVE  BILIRUBINUR NEGATIVE  KETONESUR NEGATIVE  PROTEINUR NEGATIVE  NITRITE NEGATIVE  LEUKOCYTESUR MODERATE*      Imaging: No results found.   Medications:    . amLODipine  10 mg Oral Daily  . atenolol  50 mg Oral Daily  . calcitRIOL  0.5 mcg Oral BID  . calcium carbonate  1 tablet Oral Daily  . cinacalcet  30 mg Oral Q supper  . doxazosin  8 mg Oral QPM  . famotidine  20 mg Oral QPM  . finasteride  5 mg Oral Daily  . furosemide  80 mg Oral Daily  . insulin aspart  0-5 Units Subcutaneous QHS  . insulin aspart  0-9 Units Subcutaneous TID WC  . mycophenolate  1,000 mg Oral BID  . potassium chloride SA   20 mEq Oral BID  . predniSONE  20 mg Oral QPM  . tacrolimus  3 mg Oral BID  . tacrolimus  5 mg Oral BID   acetaminophen **OR** acetaminophen, ondansetron **OR** ondansetron (ZOFRAN) IV  Assessment/ Plan:  Mr. Berel Najjar is a 52 y.o. 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 Acute renal failure on Chronic kidney disease stage IV T - Renal Transplant: baseline creatinine of 3.15, GFR of 25 on 11/08/17. - Continue immunosuppression regimen.   2. Anemia with chronic kidney disease: normocytic. Status post 2 units PRBC on 10/8.  3. Diabetes mellitus type II with chronic kidney disease: hemoglobin A1c 6.4% - discontinue metformin.   4. Secondary Hyperparathyroidism: status post parathyroidectomy - Calcitriol and cinacalcet - Tums with meals.   5. Hypertension: blood pressure at goal.   - furosemide, atenolol and amlodipine.    LOS: 2 Elicia Lui 10/10/20199:59 AM

## 2018-03-02 NOTE — Discharge Summary (Addendum)
[facility] NAME: Austin Murphy    MR#:  [id number]  DATE OF BIRTH:  1965-10-25  DATE OF ADMISSION:  02/28/2018 ADMITTING PHYSICIAN: Gladstone Lighter, MD  DATE OF DISCHARGE: 03/02/2018  PRIMARY CARE PHYSICIAN: Estanislado Emms, MD   ADMISSION DIAGNOSIS:  AKI (acute kidney injury) ([place]) [N17.9] Cystitis [N30.90] Symptomatic anemia [D64.9] Anemia due to chronic kidney disease, unspecified CKD stage [N18.9, D63.1] Acute renal failure on chronic kidney disease stage IV DISCHARGE DIAGNOSIS:  Active Problems:   Anemia Acute kidney injury on chronic kidney disease stage IV History of renal transplant Secondary hyperparathyroidism Hypertension Urinary tract infection SECONDARY DIAGNOSIS:   Past Medical History:  Diagnosis Date  . Diabetes mellitus without complication ([place])   . Hx of gout   . Hypertension   . 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 Renal disorder   . Seizures ([place])    as a child     ADMITTING HISTORY Austin Murphy  is a 52 y.o. 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  His most recent hemoglobins were in the range of 8.  He denies any blood in the stools, hematemesis, melena.  Denies any hematuria.  Not on any blood thinners at home.  But lately has been feeling extremely weak and fatigued.  Hemoglobin in the outpatient set up was noted to be low and here it is at 6.5 today.  Patient thinks he has had endoscopy and colonoscopy done in the past at [facility], but unable to locate these records.  2 units of blood has been ordered in the emergency room for symptomatic anemia.  His iron levels are pending.  HOSPITAL COURSE:  Patient was admitted to medical floor.  Patient was transfused with PRBC IV.  Patient  tolerated blood transfusion well.  Metformin was held secondary to worsening renal function.  Patient is on immunosuppressive meds for history of renal transplant.  Patient was seen by nephrology during the hospitalization.  Studies were done during the hospitalization.  Hemoglobin at the time of discharge greater than 8.  Patient follows up with nephrology in [place].  Metformin has been stopped secondary to elevated creatinine.  Urine culture grew gram-negative rods.  Patient on oral Keflex antibiotic. Patient hemodynamically stable will be discharged home on oral Keflex antibiotic for 7 days.  CONSULTS OBTAINED:  Treatment Team:  Lavonia Dana, MD  DRUG ALLERGIES:  No Known Allergies  DISCHARGE MEDICATIONS:   Allergies as of 03/02/2018   No Known Allergies     Medication List    STOP taking these medications   metFORMIN 500 MG tablet Commonly known as:  GLUCOPHAGE   tamsulosin 0.4 MG Caps capsule Commonly known as:  FLOMAX     TAKE these medications   acetaminophen 500 MG tablet Commonly known as:  TYLENOL Take 1,000-1,500 mg by mouth 2 (two) times daily as needed for moderate pain.   amLODipine 10 MG tablet Commonly known as:  NORVASC Take 10 mg by mouth daily.   atenolol 100 MG tablet Commonly known as:  TENORMIN Take 0.5 tablets (50 mg total) by mouth daily. What changed:    how much to take  when to take this   calcitRIOL 0.5 MCG capsule Commonly known as:  ROCALTROL Take 0.5  mcg by mouth 2 (two) times daily.   calcium carbonate 500 MG chewable tablet Commonly known as:  TUMS - dosed in mg elemental calcium Chew 1 tablet by mouth daily.   cephALEXin 500 MG capsule Commonly known as:  KEFLEX Take 1 capsule (500 mg total) by mouth 2 (two) times daily for 7 days. What changed:    medication strength  how much to take  when to take this   cinacalcet 30 MG tablet Commonly known as:  SENSIPAR Take 30 mg by mouth daily. With the largest meal of the  day   colchicine 0.6 MG tablet Take 0.6-1.2 mg by mouth daily as needed (for gout flare-ups).   doxazosin 8 MG tablet Commonly known as:  CARDURA Take 8 mg by mouth every evening.   famotidine 20 MG tablet Commonly known as:  PEPCID Take 20 mg by mouth every evening.   finasteride 5 MG tablet Commonly known as:  PROSCAR Take 5 mg by mouth daily.   furosemide 40 MG tablet Commonly known as:  LASIX Take 1 tablet (40 mg total) by mouth daily. What changed:    medication strength  how much to take   mycophenolate 250 MG capsule Commonly known as:  CELLCEPT Take 1,000 mg by mouth 2 (two) times daily.   potassium chloride SA 20 MEQ tablet Commonly known as:  K-DUR,KLOR-CON Take 20 mEq by mouth 2 (two) times daily.   predniSONE 20 MG tablet Commonly known as:  DELTASONE Take 20 mg by mouth every evening.   tacrolimus 1 MG capsule Commonly known as:  PROGRAF Take 3 mg by mouth 2 (two) times daily.   tacrolimus 5 MG capsule Commonly known as:  PROGRAF Take 5 mg by mouth 2 (two) times daily.       Today  Patient seen and evaluated today Hemoglobin has been stable No chest pain no shortness of breath  VITAL SIGNS:  Blood pressure (!) 151/89, pulse 60, temperature 98.4 F (36.9 C), temperature source Oral, resp. rate 18, height 5\' 6"  (1.676 m), weight 74.7 kg, SpO2 100 %.  I/O:    Intake/Output Summary (Last 24 hours) at 03/02/2018 1303 Last data filed at 03/02/2018 1139 Gross per 24 hour  Intake -  Output 2025 ml  Net -2025 ml    PHYSICAL EXAMINATION:  Physical Exam  GENERAL:  52 y.o.-year-old patient lying in the bed with no acute distress.  LUNGS: Normal breath sounds bilaterally, no wheezing, rales,rhonchi or crepitation. No use of accessory muscles of respiration.  CARDIOVASCULAR: S1, S2 normal. No murmurs, rubs, or gallops.  ABDOMEN: Soft, non-tender, non-distended. Bowel sounds present. No organomegaly or mass.  NEUROLOGIC: Moves all 4  extremities. PSYCHIATRIC: The patient is alert and oriented x 3.  SKIN: No obvious rash, lesion, or ulcer.   DATA REVIEW:   CBC Recent Labs  Lab 03/02/18 0606  WBC 4.9  HGB 8.4*  HCT 24.2*  PLT 165    Chemistries  Recent Labs  Lab 03/02/18 0606  NA 138  K 4.4  CL 107  CO2 22  GLUCOSE 223*  BUN 48*  CREATININE 3.59*  CALCIUM 8.5*    Cardiac Enzymes No results for input(s): TROPONINI in the last 168 hours.  Microbiology Results  Results for orders placed or performed during the hospital encounter of 02/28/18  Urine Culture     Status: Abnormal (Preliminary result)   Collection Time: 02/28/18  7:53 PM  Result Value Ref Range Status   Specimen Description  Final    URINE, RANDOM Performed at [facility], [address] [place], [place] [address]    Special Requests   Final    NONE Performed at [facility], [address] [place],  [address]    Culture (A)  Final    >=100,000 COLONIES/mL Lonell Grandchild NEGATIVE RODS IDENTIFICATION AND SUSCEPTIBILITIES TO FOLLOW Performed at [facility], [place] [address] [place],  [address]    Report Status PENDING  Incomplete  Chlamydia/NGC rt PCR ([place] only)     Status: None   Collection Time: 02/28/18  8:12 PM  Result Value Ref Range Status   Specimen source GC/Chlam URINE, RANDOM  Final   Chlamydia Tr NOT DETECTED NOT DETECTED Final   N gonorrhoeae NOT DETECTED NOT DETECTED Final    Comment: (NOTE) This CT/NG assay has not been evaluated in patients with a history of  hysterectomy. Performed at [facility], [address], [place],  [address]     RADIOLOGY:  No results found.  Follow up with PCP in 1 week.  Management plans discussed with the patient, family and they are in agreement.  CODE STATUS: Full code    Code Status Orders  (From admission, onward)         Start     Ordered   02/28/18 2105  Full code  Continuous     02/28/18 2104         Code Status History    Date Active Date Inactive Code Status Order ID Comments User Context   07/09/2014 1528 07/10/2014 1818 Full Code [id number]  Louellen Molder, MD Inpatient      TOTAL TIME TAKING CARE OF THIS PATIENT ON DAY OF DISCHARGE: more than 34 minutes.   Saundra Shelling M.D on 03/02/2018 at 1:03 PM  Between 7am to 6pm - Pager - [phone]  After 6pm go to [email or web address] - password [email or web address] [facility]  Office  [phone]  CC: Primary care physician; Estanislado Emms, MD  Note: This dictation was prepared with Dragon dictation along with smaller phrase technology. Any transcriptional errors that result from this process are unintentional.

## 2018-03-03 LAB — TYPE AND SCREEN
ABO/RH(D): O NEG
ANTIBODY SCREEN: NEGATIVE
DONOR AG TYPE: NEGATIVE
Donor AG Type: NEGATIVE
Unit division: 0
Unit division: 0
Unit division: 0
Unit division: 0

## 2018-03-03 LAB — BPAM RBC
BLOOD PRODUCT EXPIRATION DATE: 201910232359
Blood Product Expiration Date: 201910232359
Blood Product Expiration Date: 201910232359
Blood Product Expiration Date: 201910232359
ISSUE DATE / TIME: 201910090058
ISSUE DATE / TIME: 201910082152
UNIT TYPE AND RH: 9500
UNIT TYPE AND RH: 9500
UNIT TYPE AND RH: 9500
Unit Type and Rh: 9500

## 2018-03-05 LAB — URINE CULTURE

## 2018-04-30 ENCOUNTER — Other Ambulatory Visit: Payer: Self-pay

## 2018-04-30 ENCOUNTER — Encounter (HOSPITAL_COMMUNITY): Payer: Self-pay | Admitting: Emergency Medicine

## 2018-04-30 ENCOUNTER — Emergency Department (HOSPITAL_COMMUNITY): Payer: Medicare Other

## 2018-04-30 ENCOUNTER — Inpatient Hospital Stay (HOSPITAL_COMMUNITY)
Admission: EM | Admit: 2018-04-30 | Discharge: 2018-05-04 | DRG: 811 | Disposition: A | Discharge status: Home / Self Care | Payer: Medicare Other | Attending: Internal Medicine | Admitting: Internal Medicine

## 2018-04-30 DIAGNOSIS — N179 Acute kidney failure, unspecified: Secondary | ICD-10-CM | POA: Diagnosis present

## 2018-04-30 DIAGNOSIS — Y830 Surgical operation with transplant of whole organ as the cause of abnormal reaction of the patient, or of later complication, without mention of misadventure at the time of the procedure: Secondary | ICD-10-CM | POA: Diagnosis present

## 2018-04-30 DIAGNOSIS — IMO0002 Reserved for concepts with insufficient information to code with codable children: Secondary | ICD-10-CM | POA: Diagnosis present

## 2018-04-30 DIAGNOSIS — Z79899 Other long term (current) drug therapy: Secondary | ICD-10-CM | POA: Diagnosis not present

## 2018-04-30 DIAGNOSIS — Z940 Kidney transplant status: Secondary | ICD-10-CM

## 2018-04-30 DIAGNOSIS — Z8249 Family history of ischemic heart disease and other diseases of the circulatory system: Secondary | ICD-10-CM | POA: Diagnosis not present

## 2018-04-30 DIAGNOSIS — T8619 Other complication of kidney transplant: Secondary | ICD-10-CM | POA: Diagnosis present

## 2018-04-30 DIAGNOSIS — D631 Anemia in chronic kidney disease: Secondary | ICD-10-CM | POA: Diagnosis present

## 2018-04-30 DIAGNOSIS — E1122 Type 2 diabetes mellitus with diabetic chronic kidney disease: Secondary | ICD-10-CM | POA: Diagnosis present

## 2018-04-30 DIAGNOSIS — N184 Chronic kidney disease, stage 4 (severe): Secondary | ICD-10-CM | POA: Diagnosis present

## 2018-04-30 DIAGNOSIS — D649 Anemia, unspecified: Secondary | ICD-10-CM | POA: Diagnosis not present

## 2018-04-30 DIAGNOSIS — Z7952 Long term (current) use of systemic steroids: Secondary | ICD-10-CM | POA: Diagnosis not present

## 2018-04-30 DIAGNOSIS — Z9079 Acquired absence of other genital organ(s): Secondary | ICD-10-CM

## 2018-04-30 DIAGNOSIS — I10 Essential (primary) hypertension: Secondary | ICD-10-CM | POA: Diagnosis not present

## 2018-04-30 DIAGNOSIS — Z833 Family history of diabetes mellitus: Secondary | ICD-10-CM

## 2018-04-30 DIAGNOSIS — N189 Chronic kidney disease, unspecified: Secondary | ICD-10-CM

## 2018-04-30 DIAGNOSIS — Z7984 Long term (current) use of oral hypoglycemic drugs: Secondary | ICD-10-CM | POA: Diagnosis not present

## 2018-04-30 DIAGNOSIS — R112 Nausea with vomiting, unspecified: Secondary | ICD-10-CM

## 2018-04-30 DIAGNOSIS — E1129 Type 2 diabetes mellitus with other diabetic kidney complication: Secondary | ICD-10-CM | POA: Diagnosis not present

## 2018-04-30 DIAGNOSIS — D508 Other iron deficiency anemias: Principal | ICD-10-CM | POA: Diagnosis present

## 2018-04-30 DIAGNOSIS — R197 Diarrhea, unspecified: Secondary | ICD-10-CM | POA: Diagnosis present

## 2018-04-30 DIAGNOSIS — E1165 Type 2 diabetes mellitus with hyperglycemia: Secondary | ICD-10-CM | POA: Diagnosis present

## 2018-04-30 DIAGNOSIS — E872 Acidosis: Secondary | ICD-10-CM | POA: Diagnosis not present

## 2018-04-30 DIAGNOSIS — Z87891 Personal history of nicotine dependence: Secondary | ICD-10-CM | POA: Diagnosis not present

## 2018-04-30 DIAGNOSIS — Z87448 Personal history of other diseases of urinary system: Secondary | ICD-10-CM

## 2018-04-30 DIAGNOSIS — N186 End stage renal disease: Secondary | ICD-10-CM | POA: Diagnosis not present

## 2018-04-30 DIAGNOSIS — I129 Hypertensive chronic kidney disease with stage 1 through stage 4 chronic kidney disease, or unspecified chronic kidney disease: Secondary | ICD-10-CM | POA: Diagnosis present

## 2018-04-30 DIAGNOSIS — R571 Hypovolemic shock: Secondary | ICD-10-CM | POA: Diagnosis present

## 2018-04-30 DIAGNOSIS — D619 Aplastic anemia, unspecified: Secondary | ICD-10-CM | POA: Diagnosis not present

## 2018-04-30 DIAGNOSIS — R680 Hypothermia, not associated with low environmental temperature: Secondary | ICD-10-CM | POA: Diagnosis present

## 2018-04-30 LAB — URINALYSIS, ROUTINE W REFLEX MICROSCOPIC
Bilirubin Urine: NEGATIVE
Glucose, UA: NEGATIVE mg/dL
Hgb urine dipstick: NEGATIVE
Ketones, ur: NEGATIVE mg/dL
Leukocytes, UA: NEGATIVE
Nitrite: NEGATIVE
Protein, ur: NEGATIVE mg/dL
Specific Gravity, Urine: 1.011 (ref 1.005–1.030)
pH: 5 (ref 5.0–8.0)

## 2018-04-30 LAB — CBC WITH DIFFERENTIAL/PLATELET
Abs Immature Granulocytes: 0.02 10*3/uL (ref 0.00–0.07)
Basophils Absolute: 0 10*3/uL (ref 0.0–0.1)
Basophils Relative: 0 %
Eosinophils Absolute: 0 10*3/uL (ref 0.0–0.5)
Eosinophils Relative: 1 %
HCT: 10.7 % — ABNORMAL LOW (ref 39.0–52.0)
Hemoglobin: 3.2 g/dL — CL (ref 13.0–17.0)
IMMATURE GRANULOCYTES: 1 %
LYMPHS PCT: 26 %
Lymphs Abs: 1.1 10*3/uL (ref 0.7–4.0)
MCH: 27.1 pg (ref 26.0–34.0)
MCHC: 29.9 g/dL — ABNORMAL LOW (ref 30.0–36.0)
MCV: 90.7 fL (ref 80.0–100.0)
Monocytes Absolute: 0.3 10*3/uL (ref 0.1–1.0)
Monocytes Relative: 7 %
NRBC: 0 % (ref 0.0–0.2)
Neutro Abs: 2.8 10*3/uL (ref 1.7–7.7)
Neutrophils Relative %: 65 %
Platelets: 180 10*3/uL (ref 150–400)
RBC: 1.18 MIL/uL — AB (ref 4.22–5.81)
RDW: 15.4 % (ref 11.5–15.5)
WBC: 4.2 10*3/uL (ref 4.0–10.5)

## 2018-04-30 LAB — COMPREHENSIVE METABOLIC PANEL
ALT: 10 U/L (ref 0–44)
AST: 12 U/L — ABNORMAL LOW (ref 15–41)
Albumin: 4.1 g/dL (ref 3.5–5.0)
Alkaline Phosphatase: 58 U/L (ref 38–126)
Anion gap: 15 (ref 5–15)
BUN: 69 mg/dL — ABNORMAL HIGH (ref 6–20)
CHLORIDE: 111 mmol/L (ref 98–111)
CO2: 10 mmol/L — ABNORMAL LOW (ref 22–32)
Calcium: 9.2 mg/dL (ref 8.9–10.3)
Creatinine, Ser: 5.04 mg/dL — ABNORMAL HIGH (ref 0.61–1.24)
GFR calc Af Amer: 14 mL/min — ABNORMAL LOW (ref >60)
GFR calc non Af Amer: 12 mL/min — ABNORMAL LOW (ref >60)
Glucose, Bld: 282 mg/dL — ABNORMAL HIGH (ref 70–99)
Potassium: 4.1 mmol/L (ref 3.5–5.1)
Sodium: 136 mmol/L (ref 135–145)
Total Bilirubin: 0.9 mg/dL (ref 0.3–1.2)
Total Protein: 7 g/dL (ref 6.5–8.1)

## 2018-04-30 LAB — I-STAT CG4 LACTIC ACID, ED
Lactic Acid, Venous: 2.95 mmol/L (ref 0.5–1.9)
Lactic Acid, Venous: 2.12 mmol/L (ref 0.5–1.9)

## 2018-04-30 LAB — I-STAT TROPONIN, ED: Troponin i, poc: 0 ng/mL (ref 0.00–0.08)

## 2018-04-30 LAB — LIPASE, BLOOD: Lipase: 172 U/L — ABNORMAL HIGH (ref 11–51)

## 2018-04-30 LAB — GLUCOSE, CAPILLARY
Glucose-Capillary: 109 mg/dL — ABNORMAL HIGH (ref 70–99)
Glucose-Capillary: 167 mg/dL — ABNORMAL HIGH (ref 70–99)

## 2018-04-30 LAB — PREPARE RBC (CROSSMATCH)

## 2018-04-30 LAB — POC OCCULT BLOOD, ED: Fecal Occult Bld: NEGATIVE

## 2018-04-30 MED ORDER — INSULIN ASPART 100 UNIT/ML ~~LOC~~ SOLN
0.0000 [IU] | Freq: Three times a day (TID) | SUBCUTANEOUS | Status: DC
  Administered 2018-05-01: 2 [IU] via SUBCUTANEOUS
  Administered 2018-05-01: 1 [IU] via SUBCUTANEOUS
  Administered 2018-05-02: 2 [IU] via SUBCUTANEOUS
  Administered 2018-05-02: 1 [IU] via SUBCUTANEOUS
  Administered 2018-05-02 – 2018-05-03 (×3): 2 [IU] via SUBCUTANEOUS
  Administered 2018-05-03 – 2018-05-04 (×3): 1 [IU] via SUBCUTANEOUS

## 2018-04-30 MED ORDER — ACETAMINOPHEN 325 MG PO TABS: 650.0000 mg | ORAL_TABLET | Freq: Four times a day (QID) | ORAL | Status: DC | PRN

## 2018-04-30 MED ORDER — ONDANSETRON HCL 4 MG/2ML IJ SOLN: 4.0000 mg | Freq: Four times a day (QID) | INTRAMUSCULAR | Status: DC | PRN

## 2018-04-30 MED ORDER — SODIUM CHLORIDE 0.9% FLUSH: 3.0000 mL | INTRAVENOUS | Status: DC | PRN

## 2018-04-30 MED ORDER — SODIUM CHLORIDE 0.9 % IV BOLUS: 500.0000 mL | Freq: Once | INTRAVENOUS | Status: DC

## 2018-04-30 MED ORDER — ACETAMINOPHEN 650 MG RE SUPP: 650.0000 mg | Freq: Four times a day (QID) | RECTAL | Status: DC | PRN

## 2018-04-30 MED ORDER — HEPARIN SODIUM (PORCINE) 5000 UNIT/ML IJ SOLN
5000.0000 [IU] | Freq: Three times a day (TID) | INTRAMUSCULAR | Status: DC
  Administered 2018-04-30 – 2018-05-01 (×2): 5000 [IU] via SUBCUTANEOUS
  Filled 2018-04-30 (×2): qty 1

## 2018-04-30 MED ORDER — SODIUM CHLORIDE 0.9% IV SOLUTION: Freq: Once | INTRAVENOUS | Status: DC

## 2018-04-30 MED ORDER — SODIUM CHLORIDE 0.9 % IV SOLN
INTRAVENOUS | Status: DC
  Administered 2018-04-30 – 2018-05-01 (×2): via INTRAVENOUS

## 2018-04-30 MED ORDER — TRAMADOL HCL 50 MG PO TABS: 100.0000 mg | ORAL_TABLET | Freq: Four times a day (QID) | ORAL | Status: DC | PRN

## 2018-04-30 MED ORDER — MYCOPHENOLATE MOFETIL 250 MG PO CAPS
1000.0000 mg | ORAL_CAPSULE | Freq: Two times a day (BID) | ORAL | Status: DC
  Administered 2018-04-30 – 2018-05-04 (×8): 1000 mg via ORAL
  Filled 2018-04-30 (×8): qty 4

## 2018-04-30 MED ORDER — OXYCODONE HCL 5 MG PO TABS: 5.0000 mg | ORAL_TABLET | ORAL | Status: DC | PRN

## 2018-04-30 MED ORDER — SODIUM CHLORIDE 0.9% FLUSH
3.0000 mL | Freq: Two times a day (BID) | INTRAVENOUS | Status: DC
  Administered 2018-04-30 – 2018-05-04 (×5): 3 mL via INTRAVENOUS

## 2018-04-30 MED ORDER — ONDANSETRON HCL 4 MG PO TABS: 4.0000 mg | ORAL_TABLET | Freq: Four times a day (QID) | ORAL | Status: DC | PRN

## 2018-04-30 MED ORDER — TACROLIMUS 1 MG PO CAPS
8.0000 mg | ORAL_CAPSULE | Freq: Two times a day (BID) | ORAL | Status: DC
  Administered 2018-04-30 – 2018-05-04 (×8): 8 mg via ORAL
  Filled 2018-04-30 (×8): qty 8

## 2018-04-30 MED ORDER — POLYETHYLENE GLYCOL 3350 17 G PO PACK: 17.0000 g | PACK | Freq: Every day | ORAL | Status: DC | PRN

## 2018-04-30 MED ORDER — TRAZODONE HCL 50 MG PO TABS
25.0000 mg | ORAL_TABLET | Freq: Every evening | ORAL | Status: DC | PRN
  Administered 2018-04-30 – 2018-05-03 (×3): 25 mg via ORAL
  Filled 2018-04-30 (×3): qty 1

## 2018-04-30 MED ORDER — PREDNISONE 5 MG PO TABS
5.0000 mg | ORAL_TABLET | Freq: Every day | ORAL | Status: DC
  Administered 2018-05-01 – 2018-05-04 (×4): 5 mg via ORAL
  Filled 2018-04-30 (×4): qty 1

## 2018-04-30 MED ORDER — SODIUM CHLORIDE 0.9 % IV SOLN: 250.0000 mL | INTRAVENOUS | Status: DC | PRN

## 2018-04-30 NOTE — ED Triage Notes (Signed)
Patient presents with generalized weakness for 3 days with emesis and diarrhea. Denies any shortness of breath or chest pain. States hx of kidney transplant.

## 2018-04-30 NOTE — H&P (Signed)
History and Physical    Austin Murphy YSA:[id number] DOB: March 14, 1966 DOA: 04/30/2018  PCP: Rexene Agent, MD  Patient coming from: Home  I have personally briefly reviewed patient's old medical records in [place]  Chief Complaint: Weakness for 3 days  HPI: Austin Murphy is a 52 y.o. 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  And dyspnea on exertion.  He complains of some lower extremity edema.  He reports that he is had no fevers but feels like he was chilled on arrival.  (Patient was noted to be profoundly hypothermic on arrival with a temperature in the 94 range) he denies any hematemesis hematochezia or melena.  Has no abdominal pain or urinary symptoms.  Denies any blood coming from any orifice.  Patient was recently transfused 2 units of packed red blood cells in October at [facility].  He has had worsening renal function over the past months and in July 2019 was evaluated for a second renal transplant according to nephrology notes was deemed not a candidate.  At the time of his admission at [facility] had an Acinetobacter urinary tract infection and was treated with Keflex.  He presents today with a hemoglobin of 3.2.  2 units of packed red blood cells have been ordered in the emergency department.  On arrival his hypothermia was quickly addressed he was given crystalloid and his blood pressure improved due to his profound hypovolemic shock with hypothermia and associated hypotension (patient's usual blood pressure is 150s over 60s) he will be admitted to the hospital for further evaluation and management.    ED Course: Hemoglobin 3.2, glucose 282, creatinine 5.04 (worse than his usual 3.2) lipase 172, GFR 12, chest x-ray no acute cardiopulmonary abnormality,  nephrology consulted and recommended continued evaluation.  Review of Systems: As per HPI otherwise all other systems reviewed and  negative.    Past Medical History:  Diagnosis Date  . Diabetes mellitus without complication ([place])   . Hx of gout   . Hypertension   . 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 Renal disorder   . Seizures ([place])    as a child    Past Surgical History:  Procedure Laterality Date  . AV FISTULA PLACEMENT Right   . CYSTOSCOPY W/ URETERAL STENT PLACEMENT N/A 01/06/2018   Procedure: CYSTOSCOPY;  Surgeon: Festus Aloe, MD;  Location: [facility];  Service: Urology;  Laterality: N/A;  . NEPHRECTOMY TRANSPLANTED ORGAN    . THULIUM LASER TURP (TRANSURETHRAL RESECTION OF PROSTATE) N/A 01/06/2018   Procedure: THULIUM LASER TURP (TRANSURETHRAL RESECTION OF PROSTATE);  Surgeon: Festus Aloe, MD;  Location: [facility];  Service: Urology;  Laterality: N/A;  ONLY NEEDS 15 MIN TOTAL    Social History   Social History Narrative   Stays with his brother. Independent at baseline     reports that he quit smoking about 13 years ago. His smoking use included cigarettes. He smoked 0.00 packs per day for 3.00 years. He has never used smokeless tobacco. He reports that he drank alcohol. He reports that he does not use drugs.  No Known Allergies  Family History  Problem Relation Age of Onset  . Diabetes Mother   . CAD Father      Prior to Admission medications   Medication Sig Start Date End Date Taking?  Authorizing Provider  atenolol (TENORMIN) 50 MG tablet Take 50 mg by mouth daily. 03/09/18  Yes [provider]  metFORMIN (GLUCOPHAGE) 500 MG tablet Take 500 mg by mouth 2 (two) times daily. 04/18/18  Yes [provider]  Potassium Chloride ER 20 MEQ TBCR Take 1 tablet by mouth 2 (two) times daily. 04/13/18  Yes [provider]  acetaminophen (TYLENOL) 500 MG tablet Take 1,000-1,500 mg by mouth 2 (two) times daily as needed for  moderate pain.    [provider]  amLODipine (NORVASC) 10 MG tablet Take 10 mg by mouth daily.    [provider]  atenolol (TENORMIN) 100 MG tablet Take 0.5 tablets (50 mg total) by mouth daily. Patient not taking: Reported on 04/30/2018 07/10/14   Florencia Reasons, MD  calcitRIOL (ROCALTROL) 0.5 MCG capsule Take 0.5 mcg by mouth 2 (two) times daily.    [provider]  calcium carbonate (TUMS - DOSED IN MG ELEMENTAL CALCIUM) 500 MG chewable tablet Chew 1 tablet by mouth daily.    [provider]  cinacalcet (SENSIPAR) 30 MG tablet Take 30 mg by mouth daily. With the largest meal of the day    [provider]  colchicine 0.6 MG tablet Take 0.6-1.2 mg by mouth daily as needed (for gout flare-ups).    [provider]  doxazosin (CARDURA) 8 MG tablet Take 8 mg by mouth every evening.    [provider]  famotidine (PEPCID) 20 MG tablet Take 20 mg by mouth every evening.     [provider]  finasteride (PROSCAR) 5 MG tablet Take 5 mg by mouth daily.    [provider]  furosemide (LASIX) 40 MG tablet Take 1 tablet (40 mg total) by mouth daily. 03/02/18 04/01/18  Saundra Shelling, MD  mycophenolate (CELLCEPT) 250 MG capsule Take 1,000 mg by mouth 2 (two) times daily.    [provider]  potassium chloride SA (K-DUR,KLOR-CON) 20 MEQ tablet Take 20 mEq by mouth 2 (two) times daily.    [provider]  predniSONE (DELTASONE) 20 MG tablet Take 20 mg by mouth every evening.    [provider]  tacrolimus (PROGRAF) 1 MG capsule Take 3 mg by mouth 2 (two) times daily.     [provider]  tacrolimus (PROGRAF) 5 MG capsule Take 5 mg by mouth 2 (two) times daily.     [provider]    Physical Exam:  Constitutional: NAD, calm, comfortable Vitals:   04/30/18 1444 04/30/18 1500 04/30/18 1518 04/30/18 1518  BP:  114/69 108/73 108/73  Pulse:  82 76 74  Resp:  17 14 18   Temp: 98.2 F (36.8 C)  98.2 F (36.8 C) 98.4 F (36.9 C) 98.4 F (36.9 C)  TempSrc: Oral Oral    SpO2:  100% 100% 100%  Weight:      Height:       Eyes: PERRL, lids normal conjunctiva pale  ENMT: Mucous membranes are moist and pale. Posterior pharynx clear of any exudate or lesions.Normal dentition.  Neck: normal, supple, no masses, no thyromegaly Respiratory: clear to auscultation bilaterally, no wheezing, no crackles. Normal respiratory effort. No accessory muscle use.  Cardiovascular: Regular rate and rhythm, 2/6 flow murmur;  No  rubs / gallops. No extremity edema. 2+ pedal pulses. No carotid bruits.  Abdomen: no tenderness, no masses palpated. No hepatosplenomegaly. Bowel sounds positive.  Musculoskeletal: no clubbing / cyanosis. No joint deformity upper and lower extremities. Good ROM, no contractures. Normal muscle  tone.  Skin: no rashes, lesions, ulcers. No induration, palms and feet are very pale. Neurologic: CN 2-12 grossly intact. Sensation intact, DTR normal. Strength 5/5 in all 4.  Psychiatric: Normal judgment and insight. Alert and oriented x 3. Normal mood.    Labs on Admission: I have personally reviewed following labs and imaging studies  CBC: Recent Labs  Lab 04/30/18 1244  WBC 4.2  NEUTROABS 2.8  HGB 3.2*  HCT 10.7*  MCV 90.7  PLT 211   Basic Metabolic Panel: Recent Labs  Lab 04/30/18 1244  NA 136  K 4.1  CL 111  CO2 10*  GLUCOSE 282*  BUN 69*  CREATININE 5.04*  CALCIUM 9.2   GFR: Estimated Creatinine Clearance: 15.3 mL/min (A) (by C-G formula based on SCr of 5.04 mg/dL (H)). Liver Function Tests: Recent Labs  Lab 04/30/18 1244  AST 12*  ALT 10  ALKPHOS 58  BILITOT 0.9  PROT 7.0  ALBUMIN 4.1   Recent Labs  Lab 04/30/18 1244  LIPASE 172*   Urine analysis:    Component Value Date/Time   COLORURINE COLORLESS (A) 02/28/2018 1911   APPEARANCEUR CLEAR (A) 02/28/2018 1911   LABSPEC 1.006 02/28/2018 1911   PHURINE 5.0 02/28/2018 1911   GLUCOSEU NEGATIVE  02/28/2018 1911   HGBUR NEGATIVE 02/28/2018 1911   BILIRUBINUR NEGATIVE 02/28/2018 1911   KETONESUR NEGATIVE 02/28/2018 1911   PROTEINUR NEGATIVE 02/28/2018 1911   UROBILINOGEN 0.2 07/09/2014 0932   NITRITE NEGATIVE 02/28/2018 1911   LEUKOCYTESUR MODERATE (A) 02/28/2018 1911    Radiological Exams on Admission: Dg Chest Portable 1 View  Result Date: 04/30/2018 CLINICAL DATA:  Generalized weakness over the last 3 days. EXAM: PORTABLE CHEST 1 VIEW COMPARISON:  None. FINDINGS: The heart size and mediastinal contours are within normal limits. Both lungs are clear. The visualized skeletal structures are unremarkable. Surgical clips at the thoracic inlet presumably following thyroidectomy or parathyroid surgery. IMPRESSION: No active disease. Electronically Signed   By: Nelson Chimes M.D.   On: 04/30/2018 13:21     Assessment/Plan Principal Problem:   Hypovolemic shock (HCC) Active Problems:   Anemia associated with stage 4 chronic renal failure (HCC)   Hypothermia due to non-environmental cause   Uncontrolled hypertension   Chronic kidney disease, stage 4, severely decreased GFR (HCC)   DM (diabetes mellitus), type 2, uncontrolled, with renal complications ([place])   Renal transplant recipient   Diarrhea   1.  Hypovolemic shock: Patient presents with hypothermia and relatively low blood pressure of two 8/73 compared to his normal blood pressures of 140s to 150s over 60.  He had initial temperature of 94.5 degrees.  Since then he has improved.  We will continue fluid resuscitation with an additional 2 units of crystalloid over the next 28 hours.  Also await transfusion of 2 units of packed red blood cells.  2.  Anemia associated with stage IV chronic renal failure: Patient likely has very poor production of erythropoietin.  Will likely require erythropoietin injections as an outpatient.  Neurology to see patient.  Patient states that he is being reevaluated for a kidney transplant but nephrology  notes reflect that he was not a candidate.  3.  Hypothermia due to non-environmental cause: Likely related to hypovolemia and poor volume.  4.  Uncontrolled hypertension: Currently blood pressure is low due to hypovolemic shock.  Will monitor and restart blood pressure medications when stable.  Of note medical reconciliation by pharmacy is pending.  5.  Chronic kidney disease stage IV with  decreased GFR: Noted avoid nephrotoxic agents.  Allergy consulted.  6.  Diabetes type 2 uncontrolled with renal complications: Sliding scale insulin coverage and monitoring.  His blood glucose is elevated here although he does not appear to be taking anything at home for it.  It may be that his sugars are up to an acute phase reaction but normally at home his renal function is so poor that he does not clear any insulin he makes.  7.  Renal transplant recipient: Currently those transplants are now failing.  Management per nephrology.  8.  Diarrhea: We will check stool for C. difficile.  DVT prophylaxis: Subcu heparin Code Status: Full code Family Communication: No family present patient retains capacity Disposition Plan: Likely home in 3 to 4 days Consults called: Nephrology Dr. Hollie Salk Admission status: Observation   Lady Deutscher MD [facility] Pager [phone]  If 7PM-7AM, please contact night-coverage [email or web address] Password [facility]  04/30/2018, 3:34 PM

## 2018-04-30 NOTE — Progress Notes (Signed)
New Admission Note:   Arrival Method: stretcher from ED Mental Orientation: alert and oriented x4 Telemetry: yes, box:11 Assessment: Completed Skin: Intact IV: LAC, LH Pain: 0 Tubes: None Safety Measures: Safety Fall Prevention Plan has been discussed  Admission: COMPLETE 5 [place] Orientation: Patient has been orientated to the room, unit and staff.   Family: NA  Orders to be reviewed and implemented. Will continue to monitor the patient. Call light has been placed within reach and bed alarm has been activated.   Baldo Ash, RN

## 2018-04-30 NOTE — Consult Note (Signed)
[facility]  HISTORY AND PHYSICAL  Austin Murphy is an 52 y.o. male.    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 Sheehan for evaluation and recs re: management of AKI in a transplant recipient.    Last OP appointment with our practice on 10/08.  Cr noted to be 3.75 and Hgb 6.4.  He was sent to the ED and was given a blood tranfusion.  No record of FOBT/ GI c/s.  He presents today with worsening fatigue.  Hgb found to be 3.2, Cr up to 5.04.  Trops negative.  He is currently being transfused.  In this setting we are asked to see.    Pt says he's had vomiting and diarrhea x 3 days but when asked by another provider says he's only had one episode of diarrhea.  Hasn't had aranesp shots, none since hospitalization in October.  No f/c.  Adherent to transplant meds.  Takes pred 5 mg daily, MMF 100 mg BID, and tac 8 mg BID.  Some SOB, no CP.     PMH: Past Medical History:  Diagnosis Date  . Diabetes mellitus without complication ([place])   . Hx of gout   . Hypertension   . 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 Renal disorder   . Seizures ([place])    as a child   PSH: Past Surgical History:  Procedure Laterality Date  . AV FISTULA PLACEMENT Right   . CYSTOSCOPY W/ URETERAL STENT PLACEMENT N/A 01/06/2018   Procedure: CYSTOSCOPY;  Surgeon: Festus Aloe, MD;  Location: [facility];  Service: Urology;  Laterality: N/A;  . NEPHRECTOMY TRANSPLANTED ORGAN    . THULIUM LASER TURP (TRANSURETHRAL RESECTION OF PROSTATE) N/A 01/06/2018   Procedure: THULIUM LASER TURP (TRANSURETHRAL RESECTION OF PROSTATE);  Surgeon: Festus Aloe, MD;  Location: [facility];  Service: Urology;  Laterality: N/A;  ONLY NEEDS 90 MIN TOTAL    Past Medical History:   Diagnosis Date  . Diabetes mellitus without complication ([place])   . Hx of gout   . Hypertension   . 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 Renal disorder   . Seizures ([place])    as a child    Medications:   Scheduled: . sodium chloride   Intravenous Once  . heparin  5,000 Units Subcutaneous Q8H  . insulin aspart  0-9 Units Subcutaneous TID WC  . sodium chloride flush  3 mL Intravenous Q12H     (Not in a hospital admission)  ALLERGIES:  No Known Allergies  FAM HX: Family History  Problem Relation Age of Onset  . Diabetes Mother   . CAD Father     Social History:   reports that he quit smoking about 13 years ago. His smoking use included cigarettes. He smoked 0.00 packs per day for 3.00 years. He has never used smokeless tobacco. He reports that he drank alcohol. He reports that he does not use drugs.  ROS: ROS: all other systems reviewed and are negative.    Blood pressure 108/73, pulse 74, temperature 98.4 F (36.9 C), resp. rate 18, height 5\' 6"  (1.676 m), weight 72.6 kg, SpO2 100 %. PHYSICAL EXAM: Physical Exam  GEN NAD, lying in hosp  bed HEENT EOMI PERRL pale conjunctiva NECK no JVD PULM clear bilaterally no c/w/r CV RRR soft systolic murmur no r/g ABD soft, nontender nondistended NABS, graft in LLQ no tenderness EXT no LE edema NEURO AAO x 3, no tremor SKIN poor cap refill of hands   Results for orders placed or performed during the hospital encounter of 04/30/18 (from the past 48 hour(s))  Comprehensive metabolic panel     Status: Abnormal   Collection Time: 04/30/18 12:44 PM  Result Value Ref Range   Sodium 136 135 - 145 mmol/L   Potassium 4.1 3.5 - 5.1 mmol/L   Chloride 111 98 - 111 mmol/L   CO2 10 (L) 22 - 32 mmol/L   Glucose, Bld 282 (H) 70 - 99 mg/dL   BUN 69 (H) 6 - 20 mg/dL   Creatinine, Ser 5.04 (H) 0.61 - 1.24 mg/dL   Calcium 9.2 8.9 - 10.3 mg/dL   Total Protein 7.0 6.5 - 8.1 g/dL   Albumin 4.1 3.5 - 5.0 g/dL   AST 12 (L) 15  - 41 U/L   ALT 10 0 - 44 U/L   Alkaline Phosphatase 58 38 - 126 U/L   Total Bilirubin 0.9 0.3 - 1.2 mg/dL   GFR calc non Af Amer 12 (L) >60 mL/min   GFR calc Af Amer 14 (L) >60 mL/min   Anion gap 15 5 - 15    Comment: Performed at [facility], [address] [place], [place] [address]  CBC WITH DIFFERENTIAL     Status: Abnormal   Collection Time: 04/30/18 12:44 PM  Result Value Ref Range   WBC 4.2 4.0 - 10.5 K/uL   RBC 1.18 (L) 4.22 - 5.81 MIL/uL   Hemoglobin 3.2 (LL) 13.0 - 17.0 g/dL    Comment: REPEATED TO VERIFY THIS CRITICAL RESULT HAS VERIFIED AND BEEN CALLED TO RN N KLONTZ BY LESLIE BENFIELD ON 12 08 2019 AT 26, AND HAS BEEN READ BACK.     HCT 10.7 (L) 39.0 - 52.0 %   MCV 90.7 80.0 - 100.0 fL   MCH 27.1 26.0 - 34.0 pg   MCHC 29.9 (L) 30.0 - 36.0 g/dL   RDW 15.4 11.5 - 15.5 %   Platelets 180 150 - 400 K/uL   nRBC 0.0 0.0 - 0.2 %   Neutrophils Relative % 65 %   Neutro Abs 2.8 1.7 - 7.7 K/uL   Lymphocytes Relative 26 %   Lymphs Abs 1.1 0.7 - 4.0 K/uL   Monocytes Relative 7 %   Monocytes Absolute 0.3 0.1 - 1.0 K/uL   Eosinophils Relative 1 %   Eosinophils Absolute 0.0 0.0 - 0.5 K/uL   Basophils Relative 0 %   Basophils Absolute 0.0 0.0 - 0.1 K/uL   Immature Granulocytes 1 %   Abs Immature Granulocytes 0.02 0.00 - 0.07 K/uL    Comment: Performed at [facility] [address] [place], [place] [address]  Lipase, blood     Status: Abnormal   Collection Time: 04/30/18 12:44 PM  Result Value Ref Range   Lipase 172 (H) 11 - 51 U/L    Comment: Performed at [facility], [place] [address] [place], [place] [address]  Blood Culture (routine x 2)     Status: None (Preliminary result)   Collection Time: 04/30/18 12:47 PM  Result Value Ref Range   Specimen Description BLOOD LEFT ANTECUBITAL    Special Requests      BOTTLES DRAWN AEROBIC  AND ANAEROBIC Blood Culture adequate volume   Culture      NO GROWTH <12 HOURS Performed at [facility] [address] [place], [place] [address]    Report Status PENDING   I-stat troponin, ED (not at [facility], [facility])     Status: None   Collection Time: 04/30/18 12:52 PM  Result Value Ref Range   Troponin i, poc 0.00 0.00 - 0.08 ng/mL   Comment 3            Comment: Due to the release kinetics of cTnI, a negative result within the first hours of the onset of symptoms does not rule out myocardial infarction with certainty. If myocardial infarction is still suspected, repeat the test at appropriate intervals.   POC occult blood, ED     Status: None   Collection Time: 04/30/18 12:52 PM  Result Value Ref Range   Fecal Occult Bld NEGATIVE NEGATIVE  I-Stat CG4 Lactic Acid, ED     Status: Abnormal   Collection Time: 04/30/18 12:54 PM  Result Value Ref Range   Lactic Acid, Venous 2.95 (HH) 0.5 - 1.9 mmol/L   Comment NOTIFIED PHYSICIAN   Type and screen [place]     Status: None (Preliminary result)   Collection Time: 04/30/18  1:24 PM  Result Value Ref Range   ABO/RH(D) O NEG    Antibody Screen NEG    Sample Expiration 05/03/2018    Unit Number [id number]    Blood Component Type RED CELLS,LR    Unit division 00    Status of Unit ALLOCATED    Donor AG Type NEGATIVE FOR C ANTIGEN    Transfusion Status OK TO TRANSFUSE    Crossmatch Result COMPATIBLE    Unit Number [id number]    Blood Component Type RED CELLS,LR    Unit division 00    Status of Unit ISSUED    Donor AG Type NEGATIVE FOR C ANTIGEN    Transfusion Status OK TO TRANSFUSE    Crossmatch Result COMPATIBLE    Unit Number [id number]    Blood Component Type RED CELLS,LR    Unit division 00    Status of Unit ALLOCATED    Donor AG Type NEGATIVE FOR C ANTIGEN    Transfusion Status OK TO TRANSFUSE    Crossmatch Result COMPATIBLE    Unit Number [id number]    Blood Component Type RBC LR PHER1    Unit division 00    Status of Unit ALLOCATED    Donor AG Type NEGATIVE FOR C ANTIGEN    Transfusion Status OK TO  TRANSFUSE    Crossmatch Result COMPATIBLE   Prepare RBC     Status: None   Collection Time: 04/30/18  1:53 PM  Result Value Ref Range   Order Confirmation      ORDER PROCESSED BY BLOOD BANK Performed at [facility], [place] [address] [place], [place] [address]   I-Stat CG4 Lactic Acid, ED     Status: Abnormal   Collection Time: 04/30/18  2:37 PM  Result Value Ref Range   Lactic Acid, Venous 2.12 (HH) 0.5 - 1.9 mmol/L   Comment NOTIFIED PHYSICIAN     Dg Chest Portable 1 View  Result Date: 04/30/2018 CLINICAL DATA:  Generalized weakness over the last 3 days. EXAM: PORTABLE CHEST 1 VIEW COMPARISON:  None. FINDINGS: The heart size and mediastinal contours are within normal limits. Both lungs are clear. The visualized skeletal structures are unremarkable. Surgical clips  at the thoracic inlet presumably following thyroidectomy or parathyroid surgery. IMPRESSION: No active disease. Electronically Signed   By: Nelson Chimes M.D.   On: 04/30/2018 13:21    Assessment/Plan  1.  AKI on CKD IV in a transplant recipient: likely due to profound anemia.  I'll do a tac level tonight.  Questionable history of diarrhea.  Continue tac 8 mg BID, pred 5 mg daily, and MMF 1000 BID.  Without uremia.  I expect Cr to improve after blood administration.  2.  Anemia: Hgb 3.2  Pt denies GI losses but needs a workup.  Hasn't been getting ESA either so will give tonight.    3.  Diarrhea/ n/v: most likely gastroenteritis, could do C diff and GI pathogen panel  4.  HTN: pressures fairly soft, would hold BP meds for now   5.  DM: per primary  6.  H/o bladder outlet obstruction: would do PVR once admitted.  Had TURP 12/2017.  Will order UA.    Madelon Lips 04/30/2018, 3:33 PM

## 2018-04-30 NOTE — ED Provider Notes (Signed)
[facility] EMERGENCY DEPARTMENT Provider Note   CSN: [id number] Arrival date & time: 04/30/18  1219     History   Chief Complaint Chief Complaint  Patient presents with  . Weakness    HPI Austin Murphy is a 52 y.o. male.  HPI   Patient is a 52 year old male with a history of diabetes, hypertension, anemia, bilateral renal transplant (21 years ago, on Prograf or CellCept), who presents the emergency department today for evaluation of generalized weakness and dyspnea on exertion that has been ongoing for the last 3 days.  He also reports nausea, vomiting and diarrhea for the same time period.  He denies any known fevers but feels chills currently.  Denies any hematemesis, hematochezia or melena.  Denies any abdominal pain or urinary symptoms.  Denies cough congestion or other URI symptoms.  Denies any chest pain or shortness of breath at rest.  He denies any missed doses of his Prograf or CellCept.  Records reviewed per epic and patient was admitted at [facility] on 02/28/2018 where he was admitted for anemia.  Past Medical History:  Diagnosis Date  . Diabetes mellitus without complication ([place])   . Hx of gout   . Hypertension   . 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 Renal disorder   . Seizures ([place])    as a child    Patient Active Problem List   Diagnosis Date Noted  . Anemia 02/28/2018  . Near syncope 07/09/2014  . Renal transplant recipient 07/09/2014  . Uncontrolled hypertension 07/09/2014  . Orthostatic hypotension 07/09/2014  . Chronic kidney disease 07/09/2014  . Diabetes mellitus, type 2 ([place]) 07/09/2014    Past Surgical History:  Procedure Laterality Date  . AV FISTULA PLACEMENT Right   . CYSTOSCOPY W/ URETERAL STENT PLACEMENT N/A 01/06/2018   Procedure: CYSTOSCOPY;  Surgeon: Festus Aloe, MD;  Location: [facility];  Service: Urology;  Laterality: N/A;  . NEPHRECTOMY TRANSPLANTED ORGAN    . THULIUM LASER  TURP (TRANSURETHRAL RESECTION OF PROSTATE) N/A 01/06/2018   Procedure: THULIUM LASER TURP (TRANSURETHRAL RESECTION OF PROSTATE);  Surgeon: Festus Aloe, MD;  Location: [facility];  Service: Urology;  Laterality: N/A;  ONLY NEEDS 90 MIN TOTAL        Home Medications    Prior to Admission medications   Medication Sig Start Date End Date Taking? Authorizing Provider  atenolol (TENORMIN) 50 MG tablet Take 50 mg by mouth daily. 03/09/18  Yes [provider]  metFORMIN (GLUCOPHAGE) 500 MG tablet Take 500 mg by mouth 2 (two) times daily. 04/18/18  Yes [provider]  Potassium Chloride ER 20 MEQ TBCR Take 1 tablet by mouth 2 (two) times daily. 04/13/18  Yes [provider]  acetaminophen (TYLENOL) 500 MG tablet Take 1,000-1,500 mg by mouth 2 (two) times daily as needed for moderate pain.    [provider]  amLODipine (NORVASC) 10 MG tablet Take 10 mg by mouth daily.    [provider]  atenolol (TENORMIN) 100 MG tablet Take 0.5 tablets (50 mg total) by mouth daily. Patient not taking: Reported on 04/30/2018 07/10/14   Florencia Reasons, MD  calcitRIOL (ROCALTROL) 0.5 MCG capsule Take 0.5 mcg by mouth 2 (two) times daily.    [provider]  calcium carbonate (TUMS - DOSED IN MG ELEMENTAL CALCIUM) 500 MG chewable tablet Chew 1 tablet by mouth daily.    [provider]  cinacalcet (SENSIPAR) 30 MG tablet Take 30 mg by  mouth daily. With the largest meal of the day    [provider]  colchicine 0.6 MG tablet Take 0.6-1.2 mg by mouth daily as needed (for gout flare-ups).    [provider]  doxazosin (CARDURA) 8 MG tablet Take 8 mg by mouth every evening.    [provider]  famotidine (PEPCID) 20 MG tablet Take 20 mg by mouth every evening.     [provider]  finasteride (PROSCAR) 5 MG tablet Take 5 mg by mouth daily.    [provider]  furosemide (LASIX) 40 MG tablet Take 1 tablet (40 mg total) by  mouth daily. 03/02/18 04/01/18  Saundra Shelling, MD  mycophenolate (CELLCEPT) 250 MG capsule Take 1,000 mg by mouth 2 (two) times daily.    [provider]  potassium chloride SA (K-DUR,KLOR-CON) 20 MEQ tablet Take 20 mEq by mouth 2 (two) times daily.    [provider]  predniSONE (DELTASONE) 20 MG tablet Take 20 mg by mouth every evening.    [provider]  tacrolimus (PROGRAF) 1 MG capsule Take 3 mg by mouth 2 (two) times daily.     [provider]  tacrolimus (PROGRAF) 5 MG capsule Take 5 mg by mouth 2 (two) times daily.     [provider]    Family History Family History  Problem Relation Age of Onset  . Diabetes Mother   . CAD Father     Social History Social History   Tobacco Use  . Smoking status: Former Smoker    Packs/day: 0.00    Years: 3.00    Pack years: 0.00    Types: Cigarettes    Last attempt to quit: 05/24/2004    Years since quitting: 13.9  . Smokeless tobacco: Never Used  Substance Use Topics  . Alcohol use: Not Currently    Comment: quit 8 years ago   . Drug use: No     Allergies   Patient has no known allergies.   Review of Systems Review of Systems  Constitutional: Positive for chills. Negative for fever.  HENT: Negative for congestion.   Eyes: Negative for visual disturbance.  Respiratory: Positive for shortness of breath (with exertion). Negative for cough.   Cardiovascular: Negative for chest pain and leg swelling.  Gastrointestinal: Positive for diarrhea, nausea and vomiting. Negative for abdominal pain, blood in stool and constipation.  Genitourinary: Negative for dysuria, frequency, hematuria and urgency.  Musculoskeletal: Negative for back pain.  Skin: Negative for rash.  Neurological: Negative for headaches.   Physical Exam Updated Vital Signs BP 111/69   Pulse 72   Temp 98.2 F (36.8 C) (Oral)   Resp 15   Ht 5\' 6"  (1.676 m)   Wt 72.6 kg   SpO2 100%   BMI 25.82 kg/m   Physical Exam   Constitutional: He appears well-developed and well-nourished.  HENT:  Head: Normocephalic and atraumatic.  Soft palate is pale  Eyes:  Conjunctiva is pale  Neck: Neck supple.  Cardiovascular: Normal rate, regular rhythm, normal heart sounds and intact distal pulses.  No murmur heard. Pulmonary/Chest: Effort normal and breath sounds normal. No stridor. No respiratory distress. He has no wheezes.  Abdominal: Soft. Bowel sounds are normal. He exhibits no distension. There is no tenderness. There is no guarding.  No CVA TTP  Musculoskeletal: He exhibits no edema.  Neurological: He is alert.  Skin: Skin is warm and dry. Capillary refill takes less than 2 seconds.  Psychiatric: He has a normal  mood and affect.  Nursing note and vitals reviewed.    ED Treatments / Results  Labs (all labs ordered are listed, but only abnormal results are displayed) Labs Reviewed  COMPREHENSIVE METABOLIC PANEL - Abnormal; Notable for the following components:      Result Value   CO2 10 (*)    Glucose, Bld 282 (*)    BUN 69 (*)    Creatinine, Ser 5.04 (*)    AST 12 (*)    GFR calc non Af Amer 12 (*)    GFR calc Af Amer 14 (*)    All other components within normal limits  CBC WITH DIFFERENTIAL/PLATELET - Abnormal; Notable for the following components:   RBC 1.18 (*)    Hemoglobin 3.2 (*)    HCT 10.7 (*)    MCHC 29.9 (*)    All other components within normal limits  LIPASE, BLOOD - Abnormal; Notable for the following components:   Lipase 172 (*)    All other components within normal limits  I-STAT CG4 LACTIC ACID, ED - Abnormal; Notable for the following components:   Lactic Acid, Venous 2.95 (*)    All other components within normal limits  I-STAT CG4 LACTIC ACID, ED - Abnormal; Notable for the following components:   Lactic Acid, Venous 2.12 (*)    All other components within normal limits  CULTURE, BLOOD (ROUTINE X 2)  CULTURE, BLOOD (ROUTINE X 2)  URINALYSIS, ROUTINE W REFLEX MICROSCOPIC    I-STAT TROPONIN, ED  POC OCCULT BLOOD, ED  TYPE AND SCREEN  PREPARE RBC (CROSSMATCH)    EKG None  Radiology Dg Chest Portable 1 View  Result Date: 04/30/2018 CLINICAL DATA:  Generalized weakness over the last 3 days. EXAM: PORTABLE CHEST 1 VIEW COMPARISON:  None. FINDINGS: The heart size and mediastinal contours are within normal limits. Both lungs are clear. The visualized skeletal structures are unremarkable. Surgical clips at the thoracic inlet presumably following thyroidectomy or parathyroid surgery. IMPRESSION: No active disease. Electronically Signed   By: Nelson Chimes M.D.   On: 04/30/2018 13:21    Procedures Procedures (including critical care time) CRITICAL CARE Performed by: Rodney Booze   Total critical care time: 40 minutes  Critical care time was exclusive of separately billable procedures and treating other patients.  Critical care was necessary to treat or prevent imminent or life-threatening deterioration.  Critical care was time spent personally by me on the following activities: development of treatment plan with patient and/or surrogate as well as nursing, discussions with consultants, evaluation of patient's response to treatment, examination of patient, obtaining history from patient or surrogate, ordering and performing treatments and interventions, ordering and review of laboratory studies, ordering and review of radiographic studies, pulse oximetry and re-evaluation of patient's condition.   Medications Ordered in ED Medications  0.9 %  sodium chloride infusion (Manually program via Guardrails IV Fluids) (has no administration in time range)     Initial Impression / Assessment and Plan / ED Course  I have reviewed the triage vital signs and the nursing notes.  Pertinent labs & imaging results that were available during my care of the patient were reviewed by me and considered in my medical decision making (see chart for details).   Case  discussed with Dr. Vanita Panda who is in agreement with the plan for admission.  Final Clinical Impressions(s) / ED Diagnoses   Final diagnoses:  Symptomatic anemia  Acute kidney injury superimposed on chronic kidney disease ([place])   52 year old male  with a history of renal transplant presents emergency department with nausea, vomiting, diarrhea and generalized weakness for the last several days.  Also with some dyspnea on exertion.  He is hypothermic at 95.7 Fahrenheit, otherwise his vital signs are stable.  CBC shows a hemoglobin of 3.2, no leukocytosis.  -- 2 units PRBC ordered in the ED as well as maintenance fluid.  2 units PRBC ordered to head.   -- Hemoccult is negative. CMP with elevated BUN/creatinine at 69/5.04.  Creatinine worse from 1 month ago when it was 3.59. -- Nephrology consulted Lipase is slightly elevated at 172. Troponin is negative.   Lactic acid is slightly elevated at 2.9  Chest x-ray is negative for pneumonia.  CONSULT with Dr. Hollie Salk with nephrology who will consult on the patient.  CONSULT with Dr. Evangeline Gula with hospitalist service who will admit the patient.   ED Discharge Orders    None       Bishop Dublin 04/30/18 1454    Carmin Muskrat, MD 05/02/18 2138

## 2018-05-01 DIAGNOSIS — Z87891 Personal history of nicotine dependence: Secondary | ICD-10-CM

## 2018-05-01 DIAGNOSIS — Z940 Kidney transplant status: Secondary | ICD-10-CM

## 2018-05-01 DIAGNOSIS — D649 Anemia, unspecified: Secondary | ICD-10-CM

## 2018-05-01 LAB — IRON AND TIBC
Iron: 246 ug/dL — ABNORMAL HIGH (ref 45–182)
SATURATION RATIOS: 90 % — AB (ref 17.9–39.5)
TIBC: 273 ug/dL (ref 250–450)
UIBC: 27 ug/dL

## 2018-05-01 LAB — BASIC METABOLIC PANEL
Anion gap: 12 (ref 5–15)
BUN: 71 mg/dL — ABNORMAL HIGH (ref 6–20)
CO2: 11 mmol/L — ABNORMAL LOW (ref 22–32)
Calcium: 8.8 mg/dL — ABNORMAL LOW (ref 8.9–10.3)
Chloride: 117 mmol/L — ABNORMAL HIGH (ref 98–111)
Creatinine, Ser: 4.7 mg/dL — ABNORMAL HIGH (ref 0.61–1.24)
GFR calc non Af Amer: 13 mL/min — ABNORMAL LOW (ref >60)
GFR, EST AFRICAN AMERICAN: 15 mL/min — AB (ref >60)
Glucose, Bld: 161 mg/dL — ABNORMAL HIGH (ref 70–99)
Potassium: 4.2 mmol/L (ref 3.5–5.1)
Sodium: 140 mmol/L (ref 135–145)

## 2018-05-01 LAB — DIRECT ANTIGLOBULIN TEST (NOT AT ARMC)
DAT, IgG: NEGATIVE
DAT, complement: NEGATIVE

## 2018-05-01 LAB — GLUCOSE, CAPILLARY
Glucose-Capillary: 151 mg/dL — ABNORMAL HIGH (ref 70–99)
Glucose-Capillary: 155 mg/dL — ABNORMAL HIGH (ref 70–99)
Glucose-Capillary: 139 mg/dL — ABNORMAL HIGH (ref 70–99)

## 2018-05-01 LAB — CBC
HCT: 14.5 % — ABNORMAL LOW (ref 39.0–52.0)
Hemoglobin: 4.8 g/dL — CL (ref 13.0–17.0)
MCH: 29.1 pg (ref 26.0–34.0)
MCHC: 33.1 g/dL (ref 30.0–36.0)
MCV: 87.9 fL (ref 80.0–100.0)
NRBC: 0 % (ref 0.0–0.2)
Platelets: 107 10*3/uL — ABNORMAL LOW (ref 150–400)
RBC: 1.65 MIL/uL — ABNORMAL LOW (ref 4.22–5.81)
RDW: 14.9 % (ref 11.5–15.5)
WBC: 4 10*3/uL (ref 4.0–10.5)

## 2018-05-01 LAB — PREPARE RBC (CROSSMATCH)

## 2018-05-01 LAB — FERRITIN: Ferritin: 927 ng/mL — ABNORMAL HIGH (ref 24–336)

## 2018-05-01 LAB — C DIFFICILE QUICK SCREEN W PCR REFLEX
C Diff antigen: NEGATIVE
C Diff interpretation: NOT DETECTED
C Diff toxin: NEGATIVE

## 2018-05-01 LAB — SAVE SMEAR(SSMR), FOR PROVIDER SLIDE REVIEW

## 2018-05-01 LAB — VITAMIN B12: Vitamin B-12: 448 pg/mL (ref 180–914)

## 2018-05-01 MED ORDER — DARBEPOETIN ALFA 200 MCG/0.4ML IJ SOSY
200.0000 ug | PREFILLED_SYRINGE | INTRAMUSCULAR | Status: DC
  Administered 2018-05-01: 200 ug via SUBCUTANEOUS
  Filled 2018-05-01: qty 0.4

## 2018-05-01 MED ORDER — SODIUM CHLORIDE 0.9% IV SOLUTION
Freq: Once | INTRAVENOUS | Status: AC
  Administered 2018-05-01: 13:00:00 via INTRAVENOUS

## 2018-05-01 NOTE — Progress Notes (Signed)
[facility] ROUNDING NOTE   Subjective:   No complaints.  Blood pressure 129/83 pulse 72 temperature 98.4 O2 sats 100% room air  Sodium 140 potassium 4.2 chloride 117 CO2 11 BUN 71 creatinine 4.7 glucose 161 calcium 8.8 O2 saturations 90% hemoglobin 4.8 WBC 4.0 platelets 107  Urine output 1.05 L 05/01/2018.  Objective:  Vital signs in last 24 hours:  Temp:  [94.5 F (34.7 C)-98.9 F (37.2 C)] 98.4 F (36.9 C) (12/09 0825) Pulse Rate:  [69-85] 72 (12/09 0825) Resp:  [14-19] 18 (12/09 0825) BP: (108-144)/(65-92) 129/83 (12/09 0825) SpO2:  [99 %-100 %] 100 % (12/09 0825) Weight:  [67.8 kg-72.6 kg] 67.8 kg (12/09 0500)  Weight change:  Filed Weights   04/30/18 1240 04/30/18 2211 05/01/18 0500  Weight: 72.6 kg 67.8 kg 67.8 kg    Intake/Output: I/O last 3 completed shifts: In: 2931.3 [P.O.:1080; I.V.:942.3; Blood:909] Out: 1350 [Urine:1350]   Intake/Output this shift:  Total I/O In: 420 [P.O.:420] Out: 300 [Urine:300]  CVS- RRR RS- CTA ABD- BS present soft non-distended EXT- no edema   Basic Metabolic Panel: Recent Labs  Lab 04/30/18 1244 05/01/18 0155  NA 136 140  K 4.1 4.2  CL 111 117*  CO2 10* 11*  GLUCOSE 282* 161*  BUN 69* 71*  CREATININE 5.04* 4.70*  CALCIUM 9.2 8.8*    Liver Function Tests: Recent Labs  Lab 04/30/18 1244  AST 12*  ALT 10  ALKPHOS 58  BILITOT 0.9  PROT 7.0  ALBUMIN 4.1   Recent Labs  Lab 04/30/18 1244  LIPASE 172*   No results for input(s): AMMONIA in the last 168 hours.  CBC: Recent Labs  Lab 04/30/18 1244 05/01/18 0155  WBC 4.2 4.0  NEUTROABS 2.8  --   HGB 3.2* 4.8*  HCT 10.7* 14.5*  MCV 90.7 87.9  PLT 180 107*    Cardiac Enzymes: No results for input(s): CKTOTAL, CKMB, CKMBINDEX, TROPONINI in the last 168 hours.  BNP: Invalid input(s): POCBNP  CBG: Recent Labs  Lab 04/30/18 1731 04/30/18 2131 05/01/18 0839  GLUCAP 109* 167* 151*    Microbiology: Results for orders placed or  performed during the hospital encounter of 04/30/18  Blood Culture (routine x 2)     Status: None (Preliminary result)   Collection Time: 04/30/18 12:47 PM  Result Value Ref Range Status   Specimen Description BLOOD LEFT ANTECUBITAL  Final   Special Requests   Final    BOTTLES DRAWN AEROBIC AND ANAEROBIC Blood Culture adequate volume   Culture   Final    NO GROWTH <12 HOURS Performed at [facility], [place] [address], [place], [place] [address]    Report Status PENDING  Incomplete  C difficile quick scan w PCR reflex     Status: None   Collection Time: 04/30/18  8:55 PM  Result Value Ref Range Status   C Diff antigen NEGATIVE NEGATIVE Final   C Diff toxin NEGATIVE NEGATIVE Final   C Diff interpretation No C. difficile detected.  Final    Comment: Performed at [facility], [place] [address] [place], [place] [address]    Coagulation Studies: No results for input(s): LABPROT, INR in the last 72 hours.  Urinalysis: Recent Labs    04/30/18 2055  COLORURINE YELLOW  LABSPEC 1.011  PHURINE 5.0  GLUCOSEU NEGATIVE  HGBUR NEGATIVE  BILIRUBINUR NEGATIVE  KETONESUR NEGATIVE  PROTEINUR NEGATIVE  NITRITE NEGATIVE  LEUKOCYTESUR NEGATIVE      Imaging: Dg Chest Portable  1 View  Result Date: 04/30/2018 CLINICAL DATA:  Generalized weakness over the last 3 days. EXAM: PORTABLE CHEST 1 VIEW COMPARISON:  None. FINDINGS: The heart size and mediastinal contours are within normal limits. Both lungs are clear. The visualized skeletal structures are unremarkable. Surgical clips at the thoracic inlet presumably following thyroidectomy or parathyroid surgery. IMPRESSION: No active disease. Electronically Signed   By: Nelson Chimes M.D.   On: 04/30/2018 13:21     Medications:   . sodium chloride 100 mL/hr at 05/01/18 0641  . sodium chloride     . sodium chloride   Intravenous Once  . sodium chloride   Intravenous Once  . heparin  5,000 Units Subcutaneous Q8H  . insulin aspart  0-9  Units Subcutaneous TID WC  . mycophenolate  1,000 mg Oral BID  . predniSONE  5 mg Oral Q breakfast  . sodium chloride flush  3 mL Intravenous Q12H  . tacrolimus  8 mg Oral BID   sodium chloride, acetaminophen **OR** acetaminophen, ondansetron **OR** ondansetron (ZOFRAN) IV, oxyCODONE, polyethylene glycol, sodium chloride flush, traMADol, traZODone  Assessment/ Plan:   Status post renal transplant [facility] 21 years ago.  It appears that his baseline creatinine appears about 3.75.  Highly bladder outlet obstruction in August 2019 with acute kidney injury at that time.  Tacrolimus levels are pending  Stage IV chronic kidney disease.  Appears to be the patient's baseline creatinine, they look like they are heading towards requiring dialysis treatment.  We will continue to follow.  Tacrolimus levels pending  Anemia severe anemia status post transfusion.  Appears to present with hypovolemic shock.  Would recommend hematology consultation. and transfusion.  Will start darbepoetin  Diabetes mellitus per primary team  Diarrheal illness C. difficile pending.   LOS: [place] @TODAY @11 :29 AM

## 2018-05-01 NOTE — Progress Notes (Signed)
PROGRESS NOTE    Austin Murphy  EHM:[id number] DOB: December 26, 1965 DOA: 04/30/2018 PCP: Rexene Agent, MD     Brief Narrative:  Austin Murphy is a 52 yo male with past medical history significant for DM 2, HTN, s/p kidney transplant 21 years ago currently on antirejection medication who presents with weakness and diarrhea. He denies any hematemesis, hematochezia, black or tarry stools. He was admitted at [facility] in October 2019 with UTI and anemia at that time without significant work up. He now presents with Hgb 3.2. He was given 2u pRBC overnight with improvement of Hgb to only 4.8.   New events last 24 hours / Subjective: No acute events overnight. Patient without any complaints currently.   Assessment & Plan:   Principal Problem:   Hypovolemic shock ([place]) Active Problems:   Renal transplant recipient   Uncontrolled hypertension   Chronic kidney disease, stage 4, severely decreased GFR (HCC)   DM (diabetes mellitus), type 2, uncontrolled, with renal complications (HCC)   Anemia associated with stage 4 chronic renal failure (HCC)   Hypothermia due to non-environmental cause   Diarrhea    Acute on chronic anemia with hypovolemic shock -Unclear etiology -Transfused 2u pRBC last night, ordered 2 additional units today -No GI work up I can see in chart, patient poor historian, thinks he had upper and lower scope in the past "at urologist." When described the procedures, he does not think he had those done -FOBT negative, patient denies any GI bleeding  -Iron studies, vit B12, folate ordered  -Oncology consulted; additional labs ordered  -Trend CBC -BP stable today  CKD stage 4   -S/p renal transplant on CellCept, prednisone, tacrolimus -Baseline creatinine around 3.75 -Nephrology following   Diarrhea -C. difficile panel negative -One episode of diarrhea today, continue to monitor  Type 2 diabetes with renal complication -Continue sliding scale insulin    DVT  prophylaxis: CD Code Status: Full code  Family Communication: No family at bedside Disposition Plan: Pending further evaluation by oncology, stabilization of hemoglobin   Consultants:   Nephrology  Oncology  Procedures:   None  Antimicrobials:  Anti-infectives (From admission, onward)   None       Objective: Vitals:   05/01/18 0500 05/01/18 0825 05/01/18 1327 05/01/18 1342  BP:  129/83 119/72 124/74  Pulse:  72 73 69  Resp:  18 16 16   Temp:  98.4 F (36.9 C) 97.7 F (36.5 C) 98.2 F (36.8 C)  TempSrc:  Oral Oral Oral  SpO2:  100% 100% 100%  Weight: 67.8 kg     Height:        Intake/Output Summary (Last 24 hours) at 05/01/2018 1432 Last data filed at 05/01/2018 1300 Gross per 24 hour  Intake 3771.31 ml  Output 2250 ml  Net 1521.31 ml   Filed Weights   04/30/18 1240 04/30/18 2211 05/01/18 0500  Weight: 72.6 kg 67.8 kg 67.8 kg    Examination:  General exam: Appears calm and comfortable  Respiratory system: Clear to auscultation. Respiratory effort normal. Cardiovascular system: S1 & S2 heard, RRR. No JVD, murmurs, rubs, gallops or clicks. No pedal edema. Gastrointestinal system: Abdomen is nondistended, soft and nontender. No organomegaly or masses felt. Normal bowel sounds heard. Central nervous system: Alert and oriented. No focal neurological deficits. Extremities: Symmetric 5 x 5 power. Skin: No rashes, lesions or ulcers Psychiatry: Judgement and insight appear normal. Mood & affect appropriate.   Data Reviewed: I have personally reviewed following labs and imaging studies  CBC: Recent Labs  Lab 04/30/18 1244 05/01/18 0155  WBC 4.2 4.0  NEUTROABS 2.8  --   HGB 3.2* 4.8*  HCT 10.7* 14.5*  MCV 90.7 87.9  PLT 180 762*   Basic Metabolic Panel: Recent Labs  Lab 04/30/18 1244 05/01/18 0155  NA 136 140  K 4.1 4.2  CL 111 117*  CO2 10* 11*  GLUCOSE 282* 161*  BUN 69* 71*  CREATININE 5.04* 4.70*  CALCIUM 9.2 8.8*   GFR: Estimated  Creatinine Clearance: 16.4 mL/min (A) (by C-G formula based on SCr of 4.7 mg/dL (H)). Liver Function Tests: Recent Labs  Lab 04/30/18 1244  AST 12*  ALT 10  ALKPHOS 58  BILITOT 0.9  PROT 7.0  ALBUMIN 4.1   Recent Labs  Lab 04/30/18 1244  LIPASE 172*   No results for input(s): AMMONIA in the last 168 hours. Coagulation Profile: No results for input(s): INR, PROTIME in the last 168 hours. Cardiac Enzymes: No results for input(s): CKTOTAL, CKMB, CKMBINDEX, TROPONINI in the last 168 hours. BNP (last 3 results) No results for input(s): PROBNP in the last 8760 hours. HbA1C: No results for input(s): HGBA1C in the last 72 hours. CBG: Recent Labs  Lab 04/30/18 1731 04/30/18 2131 05/01/18 0839 05/01/18 1146  GLUCAP 109* 167* 151* 155*   Lipid Profile: No results for input(s): CHOL, HDL, LDLCALC, TRIG, CHOLHDL, LDLDIRECT in the last 72 hours. Thyroid Function Tests: No results for input(s): TSH, T4TOTAL, FREET4, T3FREE, THYROIDAB in the last 72 hours. Anemia Panel: Recent Labs    05/01/18 0745  VITAMINB12 448  FERRITIN 927*  TIBC 273  IRON 246*   Sepsis Labs: Recent Labs  Lab 04/30/18 1254 04/30/18 1437  LATICACIDVEN 2.95* 2.12*    Recent Results (from the past 240 hour(s))  Blood Culture (routine x 2)     Status: None (Preliminary result)   Collection Time: 04/30/18 12:47 PM  Result Value Ref Range Status   Specimen Description BLOOD LEFT ANTECUBITAL  Final   Special Requests   Final    BOTTLES DRAWN AEROBIC AND ANAEROBIC Blood Culture adequate volume   Culture   Final    NO GROWTH <12 HOURS Performed at [facility], [place] [address], [place], [place] [address]    Report Status PENDING  Incomplete  C difficile quick scan w PCR reflex     Status: None   Collection Time: 04/30/18  8:55 PM  Result Value Ref Range Status   C Diff antigen NEGATIVE NEGATIVE Final   C Diff toxin NEGATIVE NEGATIVE Final   C Diff interpretation No C. difficile detected.   Final    Comment: Performed at [facility], [place] [address] [place], [place] [address]       Radiology Studies: Dg Chest Portable 1 View  Result Date: 04/30/2018 CLINICAL DATA:  Generalized weakness over the last 3 days. EXAM: PORTABLE CHEST 1 VIEW COMPARISON:  None. FINDINGS: The heart size and mediastinal contours are within normal limits. Both lungs are clear. The visualized skeletal structures are unremarkable. Surgical clips at the thoracic inlet presumably following thyroidectomy or parathyroid surgery. IMPRESSION: No active disease. Electronically Signed   By: Nelson Chimes M.D.   On: 04/30/2018 13:21      Scheduled Meds: . sodium chloride   Intravenous Once  . darbepoetin (ARANESP) injection - NON-DIALYSIS  200 mcg Subcutaneous Q Mon-1800  . insulin aspart  0-9 Units Subcutaneous TID WC  . mycophenolate  1,000 mg Oral BID  .  predniSONE  5 mg Oral Q breakfast  . sodium chloride flush  3 mL Intravenous Q12H  . tacrolimus  8 mg Oral BID   Continuous Infusions: . sodium chloride       LOS: 1 day    Time spent: 45 minutes   Dessa Phi, DO [facility] [email or web address] Password [email or web address] 05/01/2018, 2:32 PM

## 2018-05-01 NOTE — Consult Note (Signed)
[facility] NOTE  Patient Care Team: Rexene Agent, MD as PCP - General (Nephrology)  CHIEF COMPLAINTS/PURPOSE OF CONSULTATION:  Severe recurrent anemia  HISTORY OF PRESENTING ILLNESS:  Austin Murphy 52 y.o. male is here because of recent diagnosis of severe recurrent anemia.  Patient has a prior history of kidney transplant at [facility].  He was doing quite well up until August 2019.  This was the first time he was diagnosed with anemia.  In October he was admitted to [facility] for anemia and he got 2 units of packed red cells and was discharged home.  At that time the reviewed his medical history and did not see any source of bleeding. He once again came in with complaints of dizziness lightheadedness and shortness of breath and was found to have a hemoglobin of 3.2 with hematocrit of 10.7.  His WBC and platelet counts were normal.  After transfusion of 2 units of packed red cells his platelet count slightly declined from dilution.  He is getting 2 more units of packed red cells currently. He reports to be feeling quite better than when he came in.  He has not noticed any blood in the stool or urine.  I reviewed her records extensively and collaborated the history with the patient.  MEDICAL HISTORY:  Past Medical History:  Diagnosis Date  . Diabetes mellitus without complication ([place])   . Hx of gout   . Hypertension   . 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 Renal disorder   . Seizures ([place])    as a child    SURGICAL HISTORY: Past Surgical History:  Procedure Laterality Date  . AV FISTULA PLACEMENT Right   . CYSTOSCOPY W/ URETERAL STENT PLACEMENT N/A 01/06/2018   Procedure: CYSTOSCOPY;  Surgeon: Festus Aloe, MD;  Location: [facility];  Service: Urology;  Laterality: N/A;  . NEPHRECTOMY TRANSPLANTED ORGAN    . THULIUM LASER TURP (TRANSURETHRAL RESECTION OF PROSTATE) N/A 01/06/2018   Procedure: THULIUM LASER TURP  (TRANSURETHRAL RESECTION OF PROSTATE);  Surgeon: Festus Aloe, MD;  Location: [facility];  Service: Urology;  Laterality: N/A;  ONLY NEEDS 90 MIN TOTAL    SOCIAL HISTORY: Social History   Socioeconomic History  . Marital status: Single    Spouse name: Not on file  . Number of children: Not on file  . Years of education: Not on file  . Highest education level: Not on file  Occupational History  . Not on file  Social Needs  . Financial resource strain: Not on file  . Food insecurity:    Worry: Not on file    Inability: Not on file  . Transportation needs:    Medical: Not on file    Non-medical: Not on file  Tobacco Use  . Smoking status: Former Smoker    Packs/day: 0.00    Years: 3.00    Pack years: 0.00    Types: Cigarettes    Last attempt to quit: 05/24/2004    Years since quitting: 13.9  . Smokeless tobacco: Never Used  Substance and Sexual Activity  . Alcohol use: Not Currently    Comment: quit 8 years ago   . Drug use: No  . Sexual activity: Not on file  Lifestyle  . Physical activity:    Days per week: Not on file    Minutes per session: Not on file  . Stress: Not on file  Relationships  . Social connections:  Talks on phone: Not on file    Gets together: Not on file    Attends religious service: Not on file    Active member of club or organization: Not on file    Attends meetings of clubs or organizations: Not on file    Relationship status: Not on file  . Intimate partner violence:    Fear of current or ex partner: Not on file    Emotionally abused: Not on file    Physically abused: Not on file    Forced sexual activity: Not on file  Other Topics Concern  . Not on file  Social History Narrative   Stays with his brother. Independent at baseline    FAMILY HISTORY: Family History  Problem Relation Age of Onset  . Diabetes Mother   . CAD Father     ALLERGIES:  has No Known Allergies.  MEDICATIONS:  Current Facility-Administered Medications   Medication Dose Route Frequency Provider Last Rate Last Dose  . 0.9 %  sodium chloride infusion (Manually program via Guardrails IV Fluids)   Intravenous Once Lady Deutscher, MD      . 0.9 %  sodium chloride infusion  250 mL Intravenous PRN Lady Deutscher, MD      . acetaminophen (TYLENOL) tablet 650 mg  650 mg Oral Q6H PRN Lady Deutscher, MD       Or  . acetaminophen (TYLENOL) suppository 650 mg  650 mg Rectal Q6H PRN Lady Deutscher, MD      . Darbepoetin Alfa (ARANESP) injection 200 mcg  200 mcg Subcutaneous Q Mon-1800 Edrick Oh, MD   200 mcg at 05/01/18 1701  . insulin aspart (novoLOG) injection 0-9 Units  0-9 Units Subcutaneous TID WC Lady Deutscher, MD   1 Units at 05/01/18 1646  . mycophenolate (CELLCEPT) capsule 1,000 mg  1,000 mg Oral BID Madelon Lips, MD   1,000 mg at 05/01/18 1137  . ondansetron (ZOFRAN) tablet 4 mg  4 mg Oral Q6H PRN Lady Deutscher, MD       Or  . ondansetron [facility]) injection 4 mg  4 mg Intravenous Q6H PRN Lady Deutscher, MD      . oxyCODONE (Oxy IR/ROXICODONE) immediate release tablet 5 mg  5 mg Oral Q4H PRN Lady Deutscher, MD      . predniSONE (DELTASONE) tablet 5 mg  5 mg Oral Q breakfast Madelon Lips, MD   5 mg at 05/01/18 1137  . sodium chloride flush (NS) 0.9 % injection 3 mL  3 mL Intravenous Q12H Lady Deutscher, MD   3 mL at 04/30/18 2245  . sodium chloride flush (NS) 0.9 % injection 3 mL  3 mL Intravenous PRN Lady Deutscher, MD      . tacrolimus (PROGRAF) capsule 8 mg  8 mg Oral BID Madelon Lips, MD   8 mg at 05/01/18 1138  . traMADol (ULTRAM) tablet 100 mg  100 mg Oral Q6H PRN Lady Deutscher, MD      . traZODone (DESYREL) tablet 25 mg  25 mg Oral QHS PRN Lady Deutscher, MD   25 mg at 04/30/18 2246    REVIEW OF SYSTEMS:   Constitutional: Denies fevers, chills or abnormal night sweats Eyes: Denies blurriness of vision, double vision or watery eyes Ears, nose, mouth, throat, and face: Denies  mucositis or sore throat Respiratory: Denies cough, dyspnea or wheezes Cardiovascular: Denies palpitation, chest discomfort or lower extremity swelling Gastrointestinal:  Denies nausea, heartburn or  change in bowel habits Skin: Denies abnormal skin rashes Lymphatics: Denies new lymphadenopathy or easy bruising Neurological:Denies numbness, tingling or new weaknesses Behavioral/Psych: Mood is stable, no new changes  All other systems were reviewed with the patient and are negative.  PHYSICAL EXAMINATION: ECOG PERFORMANCE STATUS: 1 - Symptomatic but completely ambulatory  Vitals:   05/01/18 1643 05/01/18 1658  BP: 121/72 129/81  Pulse: 72 66  Resp: 16 16  Temp: 98.2 F (36.8 C) 98.3 F (36.8 C)  SpO2: 100% 100%   Filed Weights   04/30/18 1240 04/30/18 2211 05/01/18 0500  Weight: 160 lb (72.6 kg) 149 lb 7.6 oz (67.8 kg) 149 lb 7.6 oz (67.8 kg)    GENERAL:alert, no distress and comfortable SKIN: skin color, texture, turgor are normal, no rashes or significant lesions EYES: normal, conjunctiva are pink and non-injected, sclera clear OROPHARYNX:no exudate, no erythema and lips, buccal mucosa, and tongue normal  NECK: supple, thyroid normal size, non-tender, without nodularity LYMPH:  no palpable lymphadenopathy in the cervical, axillary or inguinal LUNGS: clear to auscultation and percussion with normal breathing effort HEART: regular rate & rhythm and no murmurs and no lower extremity edema ABDOMEN:abdomen soft, non-tender and normal bowel sounds Musculoskeletal:no cyanosis of digits and no clubbing  PSYCH: alert & oriented x 3 with fluent speech NEURO: no focal motor/sensory deficits  LABORATORY DATA:  I have reviewed the data as listed Lab Results  Component Value Date   WBC 4.0 05/01/2018   HGB 4.8 (LL) 05/01/2018   HCT 14.5 (L) 05/01/2018   MCV 87.9 05/01/2018   PLT 107 (L) 05/01/2018   Lab Results  Component Value Date   NA 140 05/01/2018   K 4.2 05/01/2018   CL  117 (H) 05/01/2018   CO2 11 (L) 05/01/2018    RADIOGRAPHIC STUDIES: I have personally reviewed the radiological reports and agreed with the findings in the report.  ASSESSMENT AND PLAN:  1.  Severe normocytic anemia: Work-up is pending.   Direct Coombs test negative Iron studies and B12 were normal (however it is not clear if these numbers are reliable) Stool Hemoccults were negative SPEP, LDH, reticulocytes, haptoglobin, erythropoietin pending Total of 4 units of packed red cells are being given currently. His platelet counts may come down because of the blood transfusions.  2.  I discussed with the patient that if he cannot identify a particular etiology then we may have to perform a bone marrow biopsy. This could even be done as an outpatient I would also like to send for parvovirus serology.  3. Peripheral smear: Burr cells and spur cells but no schistocytes. This rules out TTP/HUS However, I did not see adequate numbers of granulocytes. Diff needs to be done Please obtain all future CBCs with Diff.  Since I will be leaving town from tomorrow till the end of the week, please D/W Dr.Magrinat with any questions. I will follow him as an out patient.   All questions were answered. The patient knows to call the clinic with any problems, questions or concerns.    Harriette Ohara, MD @T @

## 2018-05-01 NOTE — Care Management Note (Signed)
Case Management Note Manya Silvas, RN MSN CM 31M [phone]  Patient Details  Name: Austin Murphy MRN: [id number] Date of Birth: 06-01-1965  Subjective/Objective:            Hypovolemic shock. Hemoglobin 3.2 on admit. PMH-renal transplant 21 years ago       Action/Plan: PTA home with brother. Multiple blood transfusions. Patient reports regular f/u with PCP. Denies transportation issues. No problems affording medications or being able to pick up medications. No DME needs. No HH needs. Will continue to follow for transition of care needs.  Expected Discharge Date:                  Expected Discharge Plan:  Home/Self Care  In-House Referral:  NA  Discharge planning Services  CM Consult  Post Acute Care Choice:  NA Choice offered to:  NA  DME Arranged:  N/A DME Agency:  NA  HH Arranged:  NA HH Agency:  NA  Status of Service:  In process, will continue to follow  If discussed at Long Length of Stay Meetings, dates discussed:    Additional Comments:  Bartholomew Crews, RN 05/01/2018, 3:08 PM

## 2018-05-02 LAB — RETICULOCYTES
Immature Retic Fract: 6.5 % (ref 2.3–15.9)
RBC.: 2.26 MIL/uL — ABNORMAL LOW (ref 4.22–5.81)
Retic Count, Absolute: 14.7 10*3/uL — ABNORMAL LOW (ref 19.0–186.0)
Retic Ct Pct: 0.7 % (ref 0.4–3.1)

## 2018-05-02 LAB — CBC WITH DIFFERENTIAL/PLATELET
Abs Immature Granulocytes: 0.02 10*3/uL (ref 0.00–0.07)
Basophils Absolute: 0 10*3/uL (ref 0.0–0.1)
Basophils Relative: 0 %
Eosinophils Absolute: 0 10*3/uL (ref 0.0–0.5)
Eosinophils Relative: 0 %
HCT: 18.7 % — ABNORMAL LOW (ref 39.0–52.0)
Hemoglobin: 6.3 g/dL — CL (ref 13.0–17.0)
IMMATURE GRANULOCYTES: 1 %
Lymphocytes Relative: 28 %
Lymphs Abs: 0.9 10*3/uL (ref 0.7–4.0)
MCH: 27.6 pg (ref 26.0–34.0)
MCHC: 33.7 g/dL (ref 30.0–36.0)
MCV: 82 fL (ref 80.0–100.0)
Monocytes Absolute: 0.2 10*3/uL (ref 0.1–1.0)
Monocytes Relative: 7 %
NEUTROS PCT: 64 %
Neutro Abs: 2.1 10*3/uL (ref 1.7–7.7)
Platelets: 115 10*3/uL — ABNORMAL LOW (ref 150–400)
RBC: 2.28 MIL/uL — ABNORMAL LOW (ref 4.22–5.81)
RDW: 15 % (ref 11.5–15.5)
WBC: 3.3 10*3/uL — ABNORMAL LOW (ref 4.0–10.5)
nRBC: 0 % (ref 0.0–0.2)

## 2018-05-02 LAB — BASIC METABOLIC PANEL
Anion gap: 10 (ref 5–15)
BUN: 60 mg/dL — ABNORMAL HIGH (ref 6–20)
CHLORIDE: 115 mmol/L — AB (ref 98–111)
CO2: 13 mmol/L — ABNORMAL LOW (ref 22–32)
Calcium: 8.7 mg/dL — ABNORMAL LOW (ref 8.9–10.3)
Creatinine, Ser: 4.2 mg/dL — ABNORMAL HIGH (ref 0.61–1.24)
GFR calc Af Amer: 17 mL/min — ABNORMAL LOW (ref >60)
GFR calc non Af Amer: 15 mL/min — ABNORMAL LOW (ref >60)
Glucose, Bld: 209 mg/dL — ABNORMAL HIGH (ref 70–99)
Potassium: 4 mmol/L (ref 3.5–5.1)
Sodium: 138 mmol/L (ref 135–145)

## 2018-05-02 LAB — TACROLIMUS LEVEL: TACROLIMUS (FK506) - LABCORP: 6.3 ng/mL (ref 2.0–20.0)

## 2018-05-02 LAB — FOLATE RBC
Folate, Hemolysate: 237.9 ng/mL
Folate, RBC: 1586 ng/mL (ref >498)
Hematocrit: 15 % — CL (ref 37.5–51.0)

## 2018-05-02 LAB — GLUCOSE, CAPILLARY
GLUCOSE-CAPILLARY: 157 mg/dL — AB (ref 70–99)
Glucose-Capillary: 150 mg/dL — ABNORMAL HIGH (ref 70–99)
Glucose-Capillary: 180 mg/dL — ABNORMAL HIGH (ref 70–99)
Glucose-Capillary: 170 mg/dL — ABNORMAL HIGH (ref 70–99)

## 2018-05-02 LAB — PREPARE RBC (CROSSMATCH)

## 2018-05-02 LAB — HEMOGLOBIN AND HEMATOCRIT, BLOOD
HCT: 24.6 % — ABNORMAL LOW (ref 39.0–52.0)
Hemoglobin: 8.3 g/dL — ABNORMAL LOW (ref 13.0–17.0)

## 2018-05-02 LAB — LACTATE DEHYDROGENASE: LDH: 155 U/L (ref 98–192)

## 2018-05-02 LAB — PATHOLOGIST SMEAR REVIEW

## 2018-05-02 MED ORDER — SODIUM CHLORIDE 0.9% IV SOLUTION
Freq: Once | INTRAVENOUS | Status: AC
  Administered 2018-05-02: 08:00:00 via INTRAVENOUS

## 2018-05-02 MED ORDER — SODIUM BICARBONATE 650 MG PO TABS
650.0000 mg | ORAL_TABLET | Freq: Two times a day (BID) | ORAL | Status: DC
  Administered 2018-05-02 – 2018-05-04 (×5): 650 mg via ORAL
  Filled 2018-05-02 (×5): qty 1

## 2018-05-02 NOTE — Progress Notes (Signed)
PROGRESS NOTE    Austin Murphy  QQI:[id number] DOB: 03-25-1966 DOA: 04/30/2018 PCP: Rexene Agent, MD     Brief Narrative:  Austin Murphy is a 52 yo male with past medical history significant for DM 2, HTN, s/p kidney transplant 21 years ago currently on antirejection medication who presents with weakness and diarrhea. He denies any hematemesis, hematochezia, black or tarry stools. He was admitted at [facility] in October 2019 with UTI and anemia at that time without significant work up. He now presents with Hgb 3.2.  He was given blood transfusions, nephrology and hematology consulted.  New events last 24 hours / Subjective: Patient has no new complaints.  States that he feels better overall.  Assessment & Plan:   Principal Problem:   Hypovolemic shock ([place]) Active Problems:   Renal transplant recipient   Uncontrolled hypertension   Chronic kidney disease, stage 4, severely decreased GFR (HCC)   DM (diabetes mellitus), type 2, uncontrolled, with renal complications (HCC)   Anemia associated with stage 4 chronic renal failure (HCC)   Hypothermia due to non-environmental cause   Diarrhea    Acute on chronic normocytic anemia with hypovolemic shock -Unclear etiology -Transfused total 4 units of packed red blood cells so far, ordered 2 additional units today -No GI work up I can see in chart, patient poor historian, thinks he had upper and lower scope in the past "at urologist." When described the procedures, he does not think he had those done -FOBT negative, patient denies any GI bleeding  -Appreciate hematology  CKD stage 4   -S/p renal transplant on CellCept, prednisone, tacrolimus -Baseline creatinine around 3.75 -Nephrology following   Diarrhea -C. difficile panel negative -One episode of diarrhea today, continue to monitor  Type 2 diabetes with renal complication -Continue sliding scale insulin    DVT prophylaxis: SCD Code Status: Full code  Family  Communication: No family at bedside Disposition Plan: Transfuse 2 additional units of packed red blood cells today.  Suspect patient can be discharged tomorrow morning as long as hemoglobin, blood pressure remained stable.  He is to follow-up with hematology as an outpatient.   Consultants:   Nephrology  Oncology  Procedures:   None  Antimicrobials:  Anti-infectives (From admission, onward)   None       Objective: Vitals:   05/02/18 0828 05/02/18 0830 05/02/18 1028 05/02/18 1057  BP: (!) 146/85 139/85 (!) 142/66 137/90  Pulse: 62 66 67 65  Resp:   18 16  Temp:   98.2 F (36.8 C) 98.8 F (37.1 C)  TempSrc:   Oral Oral  SpO2:   100% 100%  Weight:      Height:        Intake/Output Summary (Last 24 hours) at 05/02/2018 1312 Last data filed at 05/02/2018 1200 Gross per 24 hour  Intake 2023 ml  Output 1600 ml  Net 423 ml   Filed Weights   04/30/18 1240 04/30/18 2211 05/01/18 0500  Weight: 72.6 kg 67.8 kg 67.8 kg    Examination: General exam: Appears calm and comfortable  Respiratory system: Clear to auscultation. Respiratory effort normal. Cardiovascular system: S1 & S2 heard, RRR. No JVD, murmurs, rubs, gallops or clicks. No pedal edema. Gastrointestinal system: Abdomen is nondistended, soft and nontender. No organomegaly or masses felt. Normal bowel sounds heard. Central nervous system: Alert and oriented. No focal neurological deficits. Extremities: Symmetric 5 x 5 power. Skin: No rashes, lesions or ulcers Psychiatry: Judgement and insight appear normal. Mood & affect appropriate.  Data Reviewed: I have personally reviewed following labs and imaging studies  CBC: Recent Labs  Lab 04/30/18 1244 05/01/18 0155 05/01/18 2332  WBC 4.2 4.0 3.3*  NEUTROABS 2.8  --  2.1  HGB 3.2* 4.8* 6.3*  HCT 10.7* 14.5* 18.7*  MCV 90.7 87.9 82.0  PLT 180 107* 782*   Basic Metabolic Panel: Recent Labs  Lab 04/30/18 1244 05/01/18 0155 05/01/18 2332  NA 136 140  138  K 4.1 4.2 4.0  CL 111 117* 115*  CO2 10* 11* 13*  GLUCOSE 282* 161* 209*  BUN 69* 71* 60*  CREATININE 5.04* 4.70* 4.20*  CALCIUM 9.2 8.8* 8.7*   GFR: Estimated Creatinine Clearance: 18.4 mL/min (A) (by C-G formula based on SCr of 4.2 mg/dL (H)). Liver Function Tests: Recent Labs  Lab 04/30/18 1244  AST 12*  ALT 10  ALKPHOS 58  BILITOT 0.9  PROT 7.0  ALBUMIN 4.1   Recent Labs  Lab 04/30/18 1244  LIPASE 172*   No results for input(s): AMMONIA in the last 168 hours. Coagulation Profile: No results for input(s): INR, PROTIME in the last 168 hours. Cardiac Enzymes: No results for input(s): CKTOTAL, CKMB, CKMBINDEX, TROPONINI in the last 168 hours. BNP (last 3 results) No results for input(s): PROBNP in the last 8760 hours. HbA1C: No results for input(s): HGBA1C in the last 72 hours. CBG: Recent Labs  Lab 05/01/18 0839 05/01/18 1146 05/01/18 1645 05/02/18 0753 05/02/18 1208  GLUCAP 151* 155* 139* 150* 157*   Lipid Profile: No results for input(s): CHOL, HDL, LDLCALC, TRIG, CHOLHDL, LDLDIRECT in the last 72 hours. Thyroid Function Tests: No results for input(s): TSH, T4TOTAL, FREET4, T3FREE, THYROIDAB in the last 72 hours. Anemia Panel: Recent Labs    05/01/18 0745 05/01/18 2332  VITAMINB12 448  --   FERRITIN 927*  --   TIBC 273  --   IRON 246*  --   RETICCTPCT  --  0.7   Sepsis Labs: Recent Labs  Lab 04/30/18 1254 04/30/18 1437  LATICACIDVEN 2.95* 2.12*    Recent Results (from the past 240 hour(s))  Blood Culture (routine x 2)     Status: None (Preliminary result)   Collection Time: 04/30/18 12:47 PM  Result Value Ref Range Status   Specimen Description BLOOD LEFT ANTECUBITAL  Final   Special Requests   Final    BOTTLES DRAWN AEROBIC AND ANAEROBIC Blood Culture adequate volume   Culture   Final    NO GROWTH 1 DAY Performed at [facility], [place] [address], [place], [place] [address]    Report Status PENDING  Incomplete  Blood  Culture (routine x 2)     Status: None (Preliminary result)   Collection Time: 04/30/18 12:52 PM  Result Value Ref Range Status   Specimen Description BLOOD LEFT ANTECUBITAL  Final   Special Requests   Final    BOTTLES DRAWN AEROBIC AND ANAEROBIC Blood Culture adequate volume   Culture   Final    NO GROWTH 1 DAY Performed at [facility], [place] [address] [place], [place] [address]    Report Status PENDING  Incomplete  C difficile quick scan w PCR reflex     Status: None   Collection Time: 04/30/18  8:55 PM  Result Value Ref Range Status   C Diff antigen NEGATIVE NEGATIVE Final   C Diff toxin NEGATIVE NEGATIVE Final   C Diff interpretation No C. difficile detected.  Final    Comment: Performed at [facility], [place] [address] [place], [place] [address]       Radiology Studies: No results found.    Scheduled Meds: . sodium chloride   Intravenous Once  . darbepoetin (ARANESP) injection - NON-DIALYSIS  200 mcg Subcutaneous Q Mon-1800  . insulin aspart  0-9 Units Subcutaneous TID WC  . mycophenolate  1,000 mg Oral BID  . predniSONE  5 mg Oral Q breakfast  . sodium bicarbonate  650 mg Oral BID  . sodium chloride flush  3 mL Intravenous Q12H  . tacrolimus  8 mg Oral BID   Continuous Infusions: . sodium chloride       LOS: 2 days    Time spent: 20 minutes   Dessa Phi, DO [facility] [email or web address] Password [email or web address] 05/02/2018, 1:12 PM

## 2018-05-02 NOTE — Progress Notes (Signed)
COURTESY NOTE: 52 y/o [place] man with a history of renal transplantation, presenting with severe anemia; workup so far shows no schistocytes on smear, adequate/normal iron and B-12, normal LDH, negative Coombs, very low reticulocyte count.  Additional labs pending.  If patient is felt to be stable for discharge we will arrange for outpatient f/u at the [facility], +/- bone marrow biopsy depending on pending studies.  Will follow with you  GM

## 2018-05-02 NOTE — Progress Notes (Signed)
[facility] ROUNDING NOTE   Subjective:   No complaints.  Blood pressure 139/85 pulse 66 temperature 98.2 O2 sats 100% room air  Sodium 138 potassium 4.0 chloride 115 CO2 13 glucose 209 BUN 60 creatinine 4.2 calcium 8.7 WBC 3.3 hemoglobin  6.3 platelets 115  Urine output 2 L 05/01/2018  Objective:  Vital signs in last 24 hours:  Temp:  [97.5 F (36.4 C)-98.3 F (36.8 C)] 98.2 F (36.8 C) (12/10 0826) Pulse Rate:  [62-78] 66 (12/10 0830) Resp:  [16-19] 18 (12/10 0826) BP: (119-147)/(61-92) 139/85 (12/10 0830) SpO2:  [100 %] 100 % (12/10 0826)  Weight change:  Filed Weights   04/30/18 1240 04/30/18 2211 05/01/18 0500  Weight: 72.6 kg 67.8 kg 67.8 kg    Intake/Output: I/O last 3 completed shifts: In: 3925.3 [P.O.:2340; I.V.:942.3; Blood:643] Out: 2850 [Urine:2850]   Intake/Output this shift:  Total I/O In: -  Out: 600 [Urine:600]  CVS- RRR JVP not elevated RS- CTA ABD- BS present soft non-distended nontender  EXT- no edema   Basic Metabolic Panel: Recent Labs  Lab 04/30/18 1244 05/01/18 0155 05/01/18 2332  NA 136 140 138  K 4.1 4.2 4.0  CL 111 117* 115*  CO2 10* 11* 13*  GLUCOSE 282* 161* 209*  BUN 69* 71* 60*  CREATININE 5.04* 4.70* 4.20*  CALCIUM 9.2 8.8* 8.7*    Liver Function Tests: Recent Labs  Lab 04/30/18 1244  AST 12*  ALT 10  ALKPHOS 58  BILITOT 0.9  PROT 7.0  ALBUMIN 4.1   Recent Labs  Lab 04/30/18 1244  LIPASE 172*   No results for input(s): AMMONIA in the last 168 hours.  CBC: Recent Labs  Lab 04/30/18 1244 05/01/18 0155 05/01/18 2332  WBC 4.2 4.0 3.3*  NEUTROABS 2.8  --  2.1  HGB 3.2* 4.8* 6.3*  HCT 10.7* 14.5* 18.7*  MCV 90.7 87.9 82.0  PLT 180 107* 115*    Cardiac Enzymes: No results for input(s): CKTOTAL, CKMB, CKMBINDEX, TROPONINI in the last 168 hours.  BNP: Invalid input(s): POCBNP  CBG: Recent Labs  Lab 04/30/18 2131 05/01/18 0839 05/01/18 1146 05/01/18 1645 05/02/18 0753  GLUCAP  167* 151* 155* 139* 150*    Microbiology: Results for orders placed or performed during the hospital encounter of 04/30/18  Blood Culture (routine x 2)     Status: None (Preliminary result)   Collection Time: 04/30/18 12:47 PM  Result Value Ref Range Status   Specimen Description BLOOD LEFT ANTECUBITAL  Final   Special Requests   Final    BOTTLES DRAWN AEROBIC AND ANAEROBIC Blood Culture adequate volume   Culture   Final    NO GROWTH 1 DAY Performed at [facility], [place] [address] [place], [place] [address]    Report Status PENDING  Incomplete  Blood Culture (routine x 2)     Status: None (Preliminary result)   Collection Time: 04/30/18 12:52 PM  Result Value Ref Range Status   Specimen Description BLOOD LEFT ANTECUBITAL  Final   Special Requests   Final    BOTTLES DRAWN AEROBIC AND ANAEROBIC Blood Culture adequate volume   Culture   Final    NO GROWTH 1 DAY Performed at [facility], [place] [address], [place], [place] [address]    Report Status PENDING  Incomplete  C difficile quick scan w PCR reflex     Status: None   Collection Time: 04/30/18  8:55 PM  Result Value Ref Range Status  C Diff antigen NEGATIVE NEGATIVE Final   C Diff toxin NEGATIVE NEGATIVE Final   C Diff interpretation No C. difficile detected.  Final    Comment: Performed at [facility], [place] [address], [place], [place] [address]    Coagulation Studies: No results for input(s): LABPROT, INR in the last 72 hours.  Urinalysis: Recent Labs    04/30/18 2055  COLORURINE YELLOW  LABSPEC 1.011  PHURINE 5.0  GLUCOSEU NEGATIVE  HGBUR NEGATIVE  BILIRUBINUR NEGATIVE  KETONESUR NEGATIVE  PROTEINUR NEGATIVE  NITRITE NEGATIVE  LEUKOCYTESUR NEGATIVE      Imaging: Dg Chest Portable 1 View  Result Date: 04/30/2018 CLINICAL DATA:  Generalized weakness over the last 3 days. EXAM: PORTABLE CHEST 1 VIEW COMPARISON:  None. FINDINGS: The heart size and mediastinal contours are within normal  limits. Both lungs are clear. The visualized skeletal structures are unremarkable. Surgical clips at the thoracic inlet presumably following thyroidectomy or parathyroid surgery. IMPRESSION: No active disease. Electronically Signed   By: Nelson Chimes M.D.   On: 04/30/2018 13:21     Medications:   . sodium chloride     . sodium chloride   Intravenous Once  . darbepoetin (ARANESP) injection - NON-DIALYSIS  200 mcg Subcutaneous Q Mon-1800  . insulin aspart  0-9 Units Subcutaneous TID WC  . mycophenolate  1,000 mg Oral BID  . predniSONE  5 mg Oral Q breakfast  . sodium chloride flush  3 mL Intravenous Q12H  . tacrolimus  8 mg Oral BID   sodium chloride, acetaminophen **OR** acetaminophen, ondansetron **OR** ondansetron (ZOFRAN) IV, oxyCODONE, sodium chloride flush, traMADol, traZODone  Assessment/ Plan:   Status post renal transplant [facility] 21 years ago.  It appears that his baseline creatinine appears about 3.75.    bladder outlet obstruction in August 2019 with acute kidney injury at that time.  Tacrolimus levels are pending  Stage IV chronic kidney disease.  Appears to be the patient's baseline creatinine, looks like we are heading towards dialysis at some point.  We will continue to follow.  Awaiting tacrolimus levels  Anemia severe anemia status post transfusion.  Appears to present with hypovolemic shock.  Appreciate assistance from Dr. Jana Hakim   have started darbepoetin 200 mcg 05/01/2018  Diabetes mellitus per primary team  Diarrheal illness C. difficile pending.  Anabolic acidosis we will start sodium bicarbonate 650 mg twice daily   LOS: 2 Sherril Croon @TODAY @10 :02 AM

## 2018-05-03 ENCOUNTER — Other Ambulatory Visit: Payer: Self-pay | Admitting: Oncology

## 2018-05-03 ENCOUNTER — Telehealth: Payer: Self-pay | Admitting: Oncology

## 2018-05-03 DIAGNOSIS — Z940 Kidney transplant status: Secondary | ICD-10-CM

## 2018-05-03 DIAGNOSIS — E1129 Type 2 diabetes mellitus with other diabetic kidney complication: Secondary | ICD-10-CM

## 2018-05-03 DIAGNOSIS — N184 Chronic kidney disease, stage 4 (severe): Secondary | ICD-10-CM

## 2018-05-03 DIAGNOSIS — N186 End stage renal disease: Secondary | ICD-10-CM

## 2018-05-03 DIAGNOSIS — D619 Aplastic anemia, unspecified: Secondary | ICD-10-CM

## 2018-05-03 DIAGNOSIS — D631 Anemia in chronic kidney disease: Secondary | ICD-10-CM

## 2018-05-03 DIAGNOSIS — E1165 Type 2 diabetes mellitus with hyperglycemia: Secondary | ICD-10-CM

## 2018-05-03 DIAGNOSIS — R571 Hypovolemic shock: Secondary | ICD-10-CM

## 2018-05-03 LAB — BPAM RBC
Blood Product Expiration Date: 201912082359
Blood Product Expiration Date: 201912102359
Blood Product Expiration Date: 201912132359
Blood Product Expiration Date: 201912162359
Blood Product Expiration Date: 201912172359
Blood Product Expiration Date: 201912172359
ISSUE DATE / TIME: 201912081738
ISSUE DATE / TIME: 201912091317
ISSUE DATE / TIME: 201912091640
ISSUE DATE / TIME: 201912101038
ISSUE DATE / TIME: 201912101335
ISSUE DATE / TIME: 201912081455
UNIT TYPE AND RH: 9500
Unit Type and Rh: 9500
Unit Type and Rh: 9500
Unit Type and Rh: 9500
Unit Type and Rh: 9500
Unit Type and Rh: 9500

## 2018-05-03 LAB — TYPE AND SCREEN
ABO/RH(D): O NEG
Antibody Screen: NEGATIVE
Donor AG Type: NEGATIVE
Donor AG Type: NEGATIVE
Donor AG Type: NEGATIVE
Donor AG Type: NEGATIVE
Donor AG Type: NEGATIVE
Donor AG Type: NEGATIVE
UNIT DIVISION: 0
Unit division: 0
Unit division: 0
Unit division: 0
Unit division: 0
Unit division: 0

## 2018-05-03 LAB — PROTEIN ELECTROPHORESIS, SERUM
A/G Ratio: 1.6 (ref 0.7–1.7)
Albumin ELP: 3.4 g/dL (ref 2.9–4.4)
Alpha-1-Globulin: 0.2 g/dL (ref 0.0–0.4)
Alpha-2-Globulin: 0.5 g/dL (ref 0.4–1.0)
Beta Globulin: 0.7 g/dL (ref 0.7–1.3)
GLOBULIN, TOTAL: 2.1 g/dL — AB (ref 2.2–3.9)
Gamma Globulin: 0.7 g/dL (ref 0.4–1.8)
M-Spike, %: 0.3 g/dL — ABNORMAL HIGH
Total Protein ELP: 5.5 g/dL — ABNORMAL LOW (ref 6.0–8.5)

## 2018-05-03 LAB — ERYTHROPOIETIN: Erythropoietin: 2799 m[IU]/mL — ABNORMAL HIGH (ref 2.6–18.5)

## 2018-05-03 LAB — CBC WITH DIFFERENTIAL/PLATELET
Abs Immature Granulocytes: 0.02 10*3/uL (ref 0.00–0.07)
Basophils Absolute: 0 10*3/uL (ref 0.0–0.1)
Basophils Relative: 0 %
Eosinophils Absolute: 0.1 10*3/uL (ref 0.0–0.5)
Eosinophils Relative: 1 %
HCT: 23.3 % — ABNORMAL LOW (ref 39.0–52.0)
Hemoglobin: 7.8 g/dL — ABNORMAL LOW (ref 13.0–17.0)
Immature Granulocytes: 1 %
Lymphocytes Relative: 31 %
Lymphs Abs: 1.2 10*3/uL (ref 0.7–4.0)
MCH: 28.2 pg (ref 26.0–34.0)
MCHC: 33.5 g/dL (ref 30.0–36.0)
MCV: 84.1 fL (ref 80.0–100.0)
Monocytes Absolute: 0.3 10*3/uL (ref 0.1–1.0)
Monocytes Relative: 7 %
Neutro Abs: 2.2 10*3/uL (ref 1.7–7.7)
Neutrophils Relative %: 60 %
Platelets: 110 10*3/uL — ABNORMAL LOW (ref 150–400)
RBC: 2.77 MIL/uL — AB (ref 4.22–5.81)
RDW: 15.4 % (ref 11.5–15.5)
WBC: 3.7 10*3/uL — ABNORMAL LOW (ref 4.0–10.5)
nRBC: 0 % (ref 0.0–0.2)

## 2018-05-03 LAB — BASIC METABOLIC PANEL
Anion gap: 8 (ref 5–15)
BUN: 50 mg/dL — ABNORMAL HIGH (ref 6–20)
CO2: 14 mmol/L — ABNORMAL LOW (ref 22–32)
Calcium: 8.5 mg/dL — ABNORMAL LOW (ref 8.9–10.3)
Chloride: 117 mmol/L — ABNORMAL HIGH (ref 98–111)
Creatinine, Ser: 3.37 mg/dL — ABNORMAL HIGH (ref 0.61–1.24)
GFR calc Af Amer: 23 mL/min — ABNORMAL LOW (ref >60)
GFR calc non Af Amer: 20 mL/min — ABNORMAL LOW (ref >60)
Glucose, Bld: 166 mg/dL — ABNORMAL HIGH (ref 70–99)
Potassium: 3.5 mmol/L (ref 3.5–5.1)
Sodium: 139 mmol/L (ref 135–145)

## 2018-05-03 LAB — HAPTOGLOBIN: HAPTOGLOBIN: 82 mg/dL (ref 34–200)

## 2018-05-03 LAB — PARVOVIRUS B19 ANTIBODY, IGG AND IGM
Parovirus B19 IgG Abs: 8 index — ABNORMAL HIGH (ref 0.0–0.8)
Parovirus B19 IgM Abs: 0.3 index (ref 0.0–0.8)

## 2018-05-03 LAB — GLUCOSE, CAPILLARY
GLUCOSE-CAPILLARY: 170 mg/dL — AB (ref 70–99)
Glucose-Capillary: 129 mg/dL — ABNORMAL HIGH (ref 70–99)
Glucose-Capillary: 154 mg/dL — ABNORMAL HIGH (ref 70–99)

## 2018-05-03 LAB — HEMOGLOBIN AND HEMATOCRIT, BLOOD
HCT: 23.8 % — ABNORMAL LOW (ref 39.0–52.0)
Hemoglobin: 7.8 g/dL — ABNORMAL LOW (ref 13.0–17.0)

## 2018-05-03 NOTE — Progress Notes (Signed)
[facility] ROUNDING NOTE   Subjective:   No complaints this morning appears to be doing well lying comfortably in bed.  Blood pressure 140/81 pulse 59 temperature 98.3 O2 sats 100% room air  Labs sodium 139 potassium 3.5 chloride 117 CO2 14 glucose 166 BUN 50 creatinine 3.37 calcium 8.5 WBC 3.7 hemoglobin 7.8 platelets 110 Urine output 2.65 L 05/02/2018 weight 67.8 kg measured 05/01/2018  Objective:  Vital signs in last 24 hours:  Temp:  [98.1 F (36.7 C)-98.6 F (37 C)] 98.3 F (36.8 C) (12/11 0747) Pulse Rate:  [59-73] 59 (12/11 0747) Resp:  [16-19] 19 (12/11 0747) BP: (125-153)/(62-90) 140/81 (12/11 0747) SpO2:  [99 %-100 %] 100 % (12/11 0747)  Weight change:  Filed Weights   04/30/18 1240 04/30/18 2211 05/01/18 0500  Weight: 72.6 kg 67.8 kg 67.8 kg    Intake/Output: I/O last 3 completed shifts: In: 2397 [P.O.:1440; Blood:957] Out: 4034 [Urine:3550]   Intake/Output this shift:  Total I/O In: 120 [P.O.:120] Out: -   CVS- RRR JVP not elevated RS- CTA ABD- BS present soft non-distended nontender  EXT- no edema   Basic Metabolic Panel: Recent Labs  Lab 04/30/18 1244 05/01/18 0155 05/01/18 2332 05/03/18 0638  NA 136 140 138 139  K 4.1 4.2 4.0 3.5  CL 111 117* 115* 117*  CO2 10* 11* 13* 14*  GLUCOSE 282* 161* 209* 166*  BUN 69* 71* 60* 50*  CREATININE 5.04* 4.70* 4.20* 3.37*  CALCIUM 9.2 8.8* 8.7* 8.5*    Liver Function Tests: Recent Labs  Lab 04/30/18 1244  AST 12*  ALT 10  ALKPHOS 58  BILITOT 0.9  PROT 7.0  ALBUMIN 4.1   Recent Labs  Lab 04/30/18 1244  LIPASE 172*   No results for input(s): AMMONIA in the last 168 hours.  CBC: Recent Labs  Lab 04/30/18 1244 05/01/18 0155 05/01/18 0745 05/01/18 2332 05/02/18 2029 05/03/18 0638  WBC 4.2 4.0  --  3.3*  --  3.7*  NEUTROABS 2.8  --   --  2.1  --  2.2  HGB 3.2* 4.8*  --  6.3* 8.3* 7.8*  HCT 10.7* 14.5* 15.0* 18.7* 24.6* 23.3*  MCV 90.7 87.9  --  82.0  --  84.1  PLT  180 107*  --  115*  --  110*    Cardiac Enzymes: No results for input(s): CKTOTAL, CKMB, CKMBINDEX, TROPONINI in the last 168 hours.  BNP: Invalid input(s): POCBNP  CBG: Recent Labs  Lab 05/02/18 1208 05/02/18 1627 05/02/18 2150 05/03/18 0746 05/03/18 1232  GLUCAP 157* 180* 170* 129* 154*    Microbiology: Results for orders placed or performed during the hospital encounter of 04/30/18  Blood Culture (routine x 2)     Status: None (Preliminary result)   Collection Time: 04/30/18 12:47 PM  Result Value Ref Range Status   Specimen Description BLOOD LEFT ANTECUBITAL  Final   Special Requests   Final    BOTTLES DRAWN AEROBIC AND ANAEROBIC Blood Culture adequate volume   Culture   Final    NO GROWTH 2 DAYS Performed at [facility], [place] [address], [place], [place] [address]    Report Status PENDING  Incomplete  Blood Culture (routine x 2)     Status: None (Preliminary result)   Collection Time: 04/30/18 12:52 PM  Result Value Ref Range Status   Specimen Description BLOOD LEFT ANTECUBITAL  Final   Special Requests   Final    BOTTLES DRAWN AEROBIC AND ANAEROBIC Blood  Culture adequate volume   Culture   Final    NO GROWTH 2 DAYS Performed at [facility], [place] [address], [place], [place] [address]    Report Status PENDING  Incomplete  C difficile quick scan w PCR reflex     Status: None   Collection Time: 04/30/18  8:55 PM  Result Value Ref Range Status   C Diff antigen NEGATIVE NEGATIVE Final   C Diff toxin NEGATIVE NEGATIVE Final   C Diff interpretation No C. difficile detected.  Final    Comment: Performed at [facility], [place] [address], [place],  [address]    Coagulation Studies: No results for input(s): LABPROT, INR in the last 72 hours.  Urinalysis: Recent Labs    04/30/18 2055  COLORURINE YELLOW  LABSPEC 1.011  PHURINE 5.0  GLUCOSEU NEGATIVE  HGBUR NEGATIVE  BILIRUBINUR NEGATIVE  KETONESUR NEGATIVE  PROTEINUR NEGATIVE   NITRITE NEGATIVE  LEUKOCYTESUR NEGATIVE      Imaging: No results found.   Medications:   . sodium chloride     . sodium chloride   Intravenous Once  . darbepoetin (ARANESP) injection - NON-DIALYSIS  200 mcg Subcutaneous Q Mon-1800  . insulin aspart  0-9 Units Subcutaneous TID WC  . mycophenolate  1,000 mg Oral BID  . predniSONE  5 mg Oral Q breakfast  . sodium bicarbonate  650 mg Oral BID  . sodium chloride flush  3 mL Intravenous Q12H  . tacrolimus  8 mg Oral BID   sodium chloride, acetaminophen **OR** acetaminophen, ondansetron **OR** ondansetron (ZOFRAN) IV, oxyCODONE, sodium chloride flush, traMADol, traZODone  Assessment/ Plan:   Status post renal transplant [facility] 21 years ago.  It appears that his baseline creatinine appears about 3.75.    bladder outlet obstruction in August 2019 with acute kidney injury at that time.  Tacrolimus levels therapeutic 6.3  Stage IV chronic kidney disease.  Appears to be the patient's baseline creatinine, looks like we are heading towards dialysis at some point.  We will continue to follow.   Anemia severe anemia status post transfusion.  Appears to present with hypovolemic shock.  Appreciate assistance from Dr. Jana Hakim   have started darbepoetin 200 mcg 05/01/2018 awaiting electrophoresis and Parvovirus B19 antibodies  Diabetes mellitus per primary team  Diarrheal illness C. difficile pending.  Metabolic acidosis slightly improved on sodium bicarbonate 650 mg twice daily  Discussed with Dr. Jana Hakim, believes that the patient is in need of darbepoetin will schedule for anemia clinic on discharge will sign off patient at this point can follow-up with his regular nephrologist   LOS: [place] @TODAY @12 :38 PM

## 2018-05-03 NOTE — Care Management Important Message (Signed)
Important Message  Patient Details  Name: Austin Murphy MRN: [id number] Date of Birth: [birth year]   Medicare Important Message Given:  Yes    [name] 05/03/2018, 2:22 PM

## 2018-05-03 NOTE — Progress Notes (Signed)
Austin Murphy   DOB:1966/04/27   WC#:[id number]   VOH#:[id number]  Subjective:  Austin Murphy is feeling better after transfusions, which he tolerated well. He is ambulating to BR w/o difficulty--is essentially asymptomatic per his account. No family in room   Objective: middle aged [place] American man examined in bed Vitals:   05/02/18 2151 05/03/18 0421  BP: 140/82 125/69  Pulse: 72 70  Resp: 18 18  Temp: 98.4 F (36.9 C) 98.1 F (36.7 C)  SpO2: 100% 99%    Body mass index is 24.13 kg/m.  Intake/Output Summary (Last 24 hours) at 05/03/2018 0722 Last data filed at 05/03/2018 0600 Gross per 24 hour  Intake 1820 ml  Output 2950 ml  Net -1130 ml   Results for Austin, Murphy (MRN [id number]) as of 05/03/2018 07:18  Ref. Range 04/30/2018 12:44 05/01/2018 01:55 05/01/2018 07:45 05/01/2018 23:32 05/02/2018 20:29  Hemoglobin Latest Ref Range: 13.0 - 17.0 g/dL 3.2 (LL) 4.8 (LL)  6.3 (LL) 8.3 (L)   . CBG (last 3)  Recent Labs    05/02/18 1208 05/02/18 1627 05/02/18 2150  GLUCAP 157* 180* 170*     Labs:  Lab Results  Component Value Date   WBC 3.3 (L) 05/01/2018   HGB 8.3 (L) 05/02/2018   HCT 24.6 (L) 05/02/2018   MCV 82.0 05/01/2018   PLT 115 (L) 05/01/2018   NEUTROABS 2.1 05/01/2018    @LASTCHEMISTRY @  Urine Studies No results for input(s): UHGB, CRYS in the last 72 hours.  Invalid input(s): UACOL, UAPR, USPG, UPH, UTP, UGL, UKET, UBIL, UNIT, UROB, [place], UEPI, UWBC, Junie [place], [place], [place]  Basic Metabolic Panel: Recent Labs  Lab 04/30/18 1244 05/01/18 0155 05/01/18 2332 05/03/18 0638  NA 136 140 138 139  K 4.1 4.2 4.0 3.5  CL 111 117* 115* 117*  CO2 10* 11* 13* 14*  GLUCOSE 282* 161* 209* 166*  BUN 69* 71* 60* 50*  CREATININE 5.04* 4.70* 4.20* 3.37*  CALCIUM 9.2 8.8* 8.7* 8.5*   GFR Estimated Creatinine Clearance: 22.9 mL/min (A) (by C-G formula based on SCr of 3.37 mg/dL (H)). Liver Function Tests: Recent Labs  Lab 04/30/18 1244  AST 12*  ALT  10  ALKPHOS 58  BILITOT 0.9  PROT 7.0  ALBUMIN 4.1   Recent Labs  Lab 04/30/18 1244  LIPASE 172*   No results for input(s): AMMONIA in the last 168 hours. Coagulation profile No results for input(s): INR, PROTIME in the last 168 hours.  CBC: Recent Labs  Lab 04/30/18 1244 05/01/18 0155 05/01/18 0745 05/01/18 2332 05/02/18 2029  WBC 4.2 4.0  --  3.3*  --   NEUTROABS 2.8  --   --  2.1  --   HGB 3.2* 4.8*  --  6.3* 8.3*  HCT 10.7* 14.5* 15.0* 18.7* 24.6*  MCV 90.7 87.9  --  82.0  --   PLT 180 107*  --  115*  --    Cardiac Enzymes: No results for input(s): CKTOTAL, CKMB, CKMBINDEX, TROPONINI in the last 168 hours. BNP: Invalid input(s): POCBNP CBG: Recent Labs  Lab 05/01/18 1645 05/02/18 0753 05/02/18 1208 05/02/18 1627 05/02/18 2150  GLUCAP 139* 150* 157* 180* 170*   D-Dimer No results for input(s): DDIMER in the last 72 hours. Hgb A1c No results for input(s): HGBA1C in the last 72 hours. Lipid Profile No results for input(s): CHOL, HDL, LDLCALC, TRIG, CHOLHDL, LDLDIRECT in the last 72 hours. Thyroid function studies No results for input(s): TSH, T4TOTAL, T3FREE, THYROIDAB in the last  72 hours.  Invalid input(s): FREET3 Anemia work up Recent Labs    05/01/18 0745 05/01/18 2332  VITAMINB12 448  --   FERRITIN 927*  --   TIBC 273  --   IRON 246*  --   RETICCTPCT  --  0.7   Microbiology Recent Results (from the past 240 hour(s))  Blood Culture (routine x 2)     Status: None (Preliminary result)   Collection Time: 04/30/18 12:47 PM  Result Value Ref Range Status   Specimen Description BLOOD LEFT ANTECUBITAL  Final   Special Requests   Final    BOTTLES DRAWN AEROBIC AND ANAEROBIC Blood Culture adequate volume   Culture   Final    NO GROWTH 2 DAYS Performed at [facility], [address] [place], [place] [address]    Report Status PENDING  Incomplete  Blood Culture (routine x 2)     Status: None (Preliminary result)   Collection Time:  04/30/18 12:52 PM  Result Value Ref Range Status   Specimen Description BLOOD LEFT ANTECUBITAL  Final   Special Requests   Final    BOTTLES DRAWN AEROBIC AND ANAEROBIC Blood Culture adequate volume   Culture   Final    NO GROWTH 2 DAYS Performed at [facility], [place] [address], [place], [place] [address]    Report Status PENDING  Incomplete  C difficile quick scan w PCR reflex     Status: None   Collection Time: 04/30/18  8:55 PM  Result Value Ref Range Status   C Diff antigen NEGATIVE NEGATIVE Final   C Diff toxin NEGATIVE NEGATIVE Final   C Diff interpretation No C. difficile detected.  Final    Comment: Performed at [facility], [place] [address] [place], [place] [address]      Studies:  .Dg Chest Portable 1 View  Result Date: 04/30/2018 CLINICAL DATA:  Generalized weakness over the last 3 days. EXAM: PORTABLE CHEST 1 VIEW COMPARISON:  None. FINDINGS: The heart size and mediastinal contours are within normal limits. Both lungs are clear. The visualized skeletal structures are unremarkable. Surgical clips at the thoracic inlet presumably following thyroidectomy or parathyroid surgery. IMPRESSION: No active disease. Electronically Signed   By: Nelson Chimes M.D.   On: 04/30/2018 13:21    Assessment: 52 y.o. 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 Patient however tells me he only started seeing Dr Joelyn Oms recently and does not recall any "shots"  I will schedule a follow-up appointment with Dr [name] in 6-8 weeks, by which time if patient receives epo support as planned he should be responding. If he does not would consider bone marrow biopsy (though doubt pure red cell aplasia)  Will follow up on pending labs. Please let me know if I can be of further help.

## 2018-05-03 NOTE — Progress Notes (Signed)
PROGRESS NOTE    Austin Murphy  JAS:[id number] DOB: 1965-07-13 DOA: 04/30/2018 PCP: Rexene Agent, MD   Brief Narrative:  HPI on 04/30/2018 by Dr. Randa Spike Austin Murphy is a 52 y.o. 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  And dyspnea on exertion.  He complains of some lower extremity edema.  He reports that he is had no fevers but feels like he was chilled on arrival.  (Patient was noted to be profoundly hypothermic on arrival with a temperature in the 94 range) he denies any hematemesis hematochezia or melena.  Has no abdominal pain or urinary symptoms.  Denies any blood coming from any orifice.  Patient was recently transfused 2 units of packed red blood cells in October at [facility].  He has had worsening renal function over the past months and in July 2019 was evaluated for a second renal transplant according to nephrology notes was deemed not a candidate.  At the time of his admission at [facility] had an Acinetobacter urinary tract infection and was treated with Keflex.  He presents today with a hemoglobin of 3.2.  2 units of packed red blood cells have been ordered in the emergency department.  On arrival his hypothermia was quickly addressed he was given crystalloid and his blood pressure improved due to his profound hypovolemic shock with hypothermia and associated hypotension (patient's usual blood pressure is 150s over 60s) he will be admitted to the hospital for further evaluation and management.    Interim history Patient admitted with acute on chronic normocytic anemia and hypovolemic shock.  He was noted to have a hemoglobin of 3.2 on admission and has received several units of PRBC.  FOBT negative.  Hematology consulted.  Assessment & Plan   Acute on  chronic normocytic anemia with hypovolemic shock -Unclear etiology -Transfused 6 units PRBC -Hemoglobin currently 7.8 (suspect baseline approximately 8) -FOBT negative, patient denies any GI bleeding -Hematology/oncology consulted and appreciated  CKD stage IV -Status post renal transplant approximately 20 years ago at [place], prednisone, tacrolimus -Baseline creatinine approximately 3.75 -Nephrology consulted and appreciated  Diarrhea -C. difficile negative -Continue to monitor  Diabetes mellitus, type II -Continue insulin sliding scale with CBG monitoring  DVT Prophylaxis SCDs  Code Status: Full  Family Communication: None at bedside  Disposition Plan: Admitted.  Continue to monitor hemoglobin.  Suspect home on 05/04/2017  Consultants Nephrology Oncology  Procedures  None  Antibiotics   Anti-infectives (From admission, onward)   None      Subjective:   Austin Murphy seen and examined today.  Patient states that he is feeling better than when he first came to the hospital.  Denies current chest pain, shortness of breath, dizziness or headache, abdominal pain, nausea or vomiting.  Does complain of diarrhea.  States he has not been up much and feels mildly weak.  Objective:   Vitals:   05/02/18 1633 05/02/18 2151 05/03/18 0421 05/03/18 0747  BP: (!) 153/90 140/82 125/69 140/81  Pulse: 67 72 70 (!) 59  Resp: 18 18 18 19   Temp: 98.4 F (36.9 C) 98.4 F (36.9 C) 98.1 F (36.7 C) 98.3 F (36.8 C)  TempSrc: Oral Oral Oral Oral  SpO2: 100% 100% 99% 100%  Weight:      Height:        Intake/Output Summary (  Last 24 hours) at 05/03/2018 1617 Last data filed at 05/03/2018 1200 Gross per 24 hour  Intake 600 ml  Output 1100 ml  Net -500 ml   Filed Weights   04/30/18 1240 04/30/18 2211 05/01/18 0500  Weight: 72.6 kg 67.8 kg 67.8 kg    Exam  General: Well developed, well nourished, NAD, appears stated age  32: NCAT, mucous membranes  moist.   Neck: Supple  Cardiovascular: S1 S2 auscultated, no rubs, murmurs or gallops. Regular rate and rhythm.  Respiratory: Clear to auscultation bilaterally with equal chest rise  Abdomen: Soft, nontender, nondistended, + bowel sounds  Extremities: warm dry without cyanosis clubbing or edema  Neuro: AAOx3, nonfocal  Psych: Normal affect and demeanor with intact judgement and insight   Data Reviewed: I have personally reviewed following labs and imaging studies  CBC: Recent Labs  Lab 04/30/18 1244 05/01/18 0155 05/01/18 0745 05/01/18 2332 05/02/18 2029 05/03/18 0638 05/03/18 1227  WBC 4.2 4.0  --  3.3*  --  3.7*  --   NEUTROABS 2.8  --   --  2.1  --  2.2  --   HGB 3.2* 4.8*  --  6.3* 8.3* 7.8* 7.8*  HCT 10.7* 14.5* 15.0* 18.7* 24.6* 23.3* 23.8*  MCV 90.7 87.9  --  82.0  --  84.1  --   PLT 180 107*  --  115*  --  110*  --    Basic Metabolic Panel: Recent Labs  Lab 04/30/18 1244 05/01/18 0155 05/01/18 2332 05/03/18 0638  NA 136 140 138 139  K 4.1 4.2 4.0 3.5  CL 111 117* 115* 117*  CO2 10* 11* 13* 14*  GLUCOSE 282* 161* 209* 166*  BUN 69* 71* 60* 50*  CREATININE 5.04* 4.70* 4.20* 3.37*  CALCIUM 9.2 8.8* 8.7* 8.5*   GFR: Estimated Creatinine Clearance: 22.9 mL/min (A) (by C-G formula based on SCr of 3.37 mg/dL (H)). Liver Function Tests: Recent Labs  Lab 04/30/18 1244  AST 12*  ALT 10  ALKPHOS 58  BILITOT 0.9  PROT 7.0  ALBUMIN 4.1   Recent Labs  Lab 04/30/18 1244  LIPASE 172*   No results for input(s): AMMONIA in the last 168 hours. Coagulation Profile: No results for input(s): INR, PROTIME in the last 168 hours. Cardiac Enzymes: No results for input(s): CKTOTAL, CKMB, CKMBINDEX, TROPONINI in the last 168 hours. BNP (last 3 results) No results for input(s): PROBNP in the last 8760 hours. HbA1C: No results for input(s): HGBA1C in the last 72 hours. CBG: Recent Labs  Lab 05/02/18 1208 05/02/18 1627 05/02/18 2150 05/03/18 0746  05/03/18 1232  GLUCAP 157* 180* 170* 129* 154*   Lipid Profile: No results for input(s): CHOL, HDL, LDLCALC, TRIG, CHOLHDL, LDLDIRECT in the last 72 hours. Thyroid Function Tests: No results for input(s): TSH, T4TOTAL, FREET4, T3FREE, THYROIDAB in the last 72 hours. Anemia Panel: Recent Labs    05/01/18 0745 05/01/18 2332  VITAMINB12 448  --   FERRITIN 927*  --   TIBC 273  --   IRON 246*  --   RETICCTPCT  --  0.7   Urine analysis:    Component Value Date/Time   COLORURINE YELLOW 04/30/2018 2055   APPEARANCEUR CLEAR 04/30/2018 2055   LABSPEC 1.011 04/30/2018 2055   PHURINE 5.0 04/30/2018 2055   GLUCOSEU NEGATIVE 04/30/2018 2055   HGBUR NEGATIVE 04/30/2018 2055   BILIRUBINUR NEGATIVE 04/30/2018 2055   [place] NEGATIVE 04/30/2018 2055   PROTEINUR NEGATIVE 04/30/2018 2055   UROBILINOGEN 0.2 07/09/2014  [place] 04/30/2018 2055   LEUKOCYTESUR NEGATIVE 04/30/2018 2055   Sepsis Labs: @LABRCNTIP (procalcitonin:4,lacticidven:4)  ) Recent Results (from the past 240 hour(s))  Blood Culture (routine x 2)     Status: None (Preliminary result)   Collection Time: 04/30/18 12:47 PM  Result Value Ref Range Status   Specimen Description BLOOD LEFT ANTECUBITAL  Final   Special Requests   Final    BOTTLES DRAWN AEROBIC AND ANAEROBIC Blood Culture adequate volume   Culture   Final    NO GROWTH 3 DAYS Performed at [facility], [place] [address] [place], [place] [address]    Report Status PENDING  Incomplete  Blood Culture (routine x 2)     Status: None (Preliminary result)   Collection Time: 04/30/18 12:52 PM  Result Value Ref Range Status   Specimen Description BLOOD LEFT ANTECUBITAL  Final   Special Requests   Final    BOTTLES DRAWN AEROBIC AND ANAEROBIC Blood Culture adequate volume   Culture   Final    NO GROWTH 3 DAYS Performed at [facility], [place] [address] [place], [place] [address]    Report Status PENDING  Incomplete  C difficile quick scan  w PCR reflex     Status: None   Collection Time: 04/30/18  8:55 PM  Result Value Ref Range Status   C Diff antigen NEGATIVE NEGATIVE Final   C Diff toxin NEGATIVE NEGATIVE Final   C Diff interpretation No C. difficile detected.  Final    Comment: Performed at [facility], [place] [address] [place], [place] [address]      Radiology Studies: No results found.   Scheduled Meds: . sodium chloride   Intravenous Once  . darbepoetin (ARANESP) injection - NON-DIALYSIS  200 mcg Subcutaneous Q Mon-1800  . insulin aspart  0-9 Units Subcutaneous TID WC  . mycophenolate  1,000 mg Oral BID  . predniSONE  5 mg Oral Q breakfast  . sodium bicarbonate  650 mg Oral BID  . sodium chloride flush  3 mL Intravenous Q12H  . tacrolimus  8 mg Oral BID   Continuous Infusions: . sodium chloride       LOS: 3 days   Time Spent in minutes   30 minutes  Austin Murphy D.O. on 05/03/2018 at 4:17 PM  Between 7am to 7pm - Please see pager noted on [email or web address]  After 7pm go to [email or web address]  And look for the night coverage person covering for me after hours  [facility] Office  [phone]

## 2018-05-03 NOTE — Telephone Encounter (Signed)
Scheduled appt per 12/11 sch message - pt is aware of appt - reminder letter sent in the mail

## 2018-05-04 DIAGNOSIS — I10 Essential (primary) hypertension: Secondary | ICD-10-CM

## 2018-05-04 DIAGNOSIS — R197 Diarrhea, unspecified: Secondary | ICD-10-CM

## 2018-05-04 LAB — HEMOGLOBIN AND HEMATOCRIT, BLOOD
HCT: 22.9 % — ABNORMAL LOW (ref 39.0–52.0)
Hemoglobin: 7.8 g/dL — ABNORMAL LOW (ref 13.0–17.0)

## 2018-05-04 LAB — GLUCOSE, CAPILLARY
GLUCOSE-CAPILLARY: 156 mg/dL — AB (ref 70–99)
Glucose-Capillary: 122 mg/dL — ABNORMAL HIGH (ref 70–99)

## 2018-05-04 MED ORDER — PREDNISONE 5 MG PO TABS: 5.0000 mg | ORAL_TABLET | Freq: Every day | ORAL | 0 refills | Status: AC

## 2018-05-04 NOTE — Discharge Summary (Signed)
Physician Discharge Summary  Austin Murphy YDX:[id number] DOB: 10/23/1965 DOA: 04/30/2018  PCP: Rexene Agent, MD  Admit date: 04/30/2018 Discharge date: 05/04/2018  Time spent: 45 minutes  Recommendations for Outpatient Follow-up:  Patient will be discharged to home.  Patient will need to follow up with primary care provider within one week of discharge, repeat CBC.  Follow up with oncology and nephrology. Patient should continue medications as prescribed.  Patient should follow a renal/carb modified diet.   Discharge Diagnoses:  Acute on chronic normocytic anemia with hypovolemic shock CKD stage IV Diarrhea Diabetes mellitus, type II  Discharge Condition: Stable  Diet recommendation: Renal/carb modified  Filed Weights   04/30/18 1240 04/30/18 2211 05/01/18 0500  Weight: 72.6 kg 67.8 kg 67.8 kg    History of present illness:  on 04/30/2018 by Dr. Randa Spike Kratos Hawkinsis a 52 y.o.malewith medical history significant ofdiabetes type 2, hypertension, anemia, bilateral kidney transplant 21years ago on antirejection medication who presents with complaints of weakness with associated nausea vomiting and diarrhea for approximately 3 days. And dyspnea on exertion. He complains of some lower extremity edema. He reports that he is had no fevers but feels like he was chilled on arrival. (Patient was noted to be profoundly hypothermic on arrival with a temperature in the 94 range) he denies any hematemesis hematochezia or melena. Has no abdominal pain or urinary symptoms. Denies any blood coming from any orifice. Patient was recently transfused 2 units of packed red blood cells in October at [facility]. He has had worsening renal function over the past months and in July 2019 was evaluated for a second renal transplant according to nephrology notes was deemed not a candidate. At the time of his admission at [facility] had an Acinetobacter urinary tract infection and was  treated with Keflex. He presents today with a hemoglobin of 3.2. 2 units of packed red blood cells have been ordered in the emergency department. On arrival his hypothermia was quickly addressed he was given crystalloid and his blood pressure improved due to his profound hypovolemic shock with hypothermia and associated hypotension (patient's usual blood pressure is 150s over 60s) he will be admitted to the hospital for further evaluation and management.  Hospital Course:  Acute on chronic normocytic anemia with hypovolemic shock -Unclear etiology -Transfused 6 units PRBC -Hemoglobin currently 7.8 (suspect baseline approximately 8) -FOBT negative, patient denies any GI bleeding -Hematology/oncology consulted and appreciated  CKD stage IV -Status post renal transplant approximately 20 years ago at [place], prednisone, tacrolimus -Baseline creatinine approximately 3.75 -Nephrology consulted and appreciated  Diarrhea -C. difficile negative -Continue to monitor  Diabetes mellitus, type II -Continue insulin sliding scale with CBG monitoring  Consultants Nephrology Oncology  Procedures  None  Discharge Exam: Vitals:   05/04/18 0506 05/04/18 0757  BP: 123/85 138/87  Pulse: 60 (!) 57  Resp: 18 18  Temp: 98.4 F (36.9 C) 98.5 F (36.9 C)  SpO2: 100% 100%    Patient feeling better this morning.  Feels his breathing is back to baseline.  Denies any weakness, dizziness, headache, chest pain, shortness of breath, abdominal pain, nausea or vomiting, diarrhea or constipation.  Feels he is ready to go home today.   General: Well developed, well nourished, NAD, appears stated age  HEENT: NCAT, mucous membranes moist.  Cardiovascular: S1 S2 auscultated, no rubs, murmurs or gallops. Regular rate and rhythm.  Respiratory: Clear to auscultation bilaterally with equal chest rise  Abdomen: Soft, nontender, nondistended, + bowel sounds  Extremities: warm dry  without cyanosis clubbing or edema  Neuro: AAOx3, nonfocal  Psych: Normal affect and demeanor with intact judgement and insight, pleasant  Discharge Instructions Discharge Instructions    Discharge instructions   Complete by:  As directed    Patient will be discharged to home.  Patient will need to follow up with primary care provider within one week of discharge, repeat CBC.  Follow up with oncology and nephrology. Patient should continue medications as prescribed.  Patient should follow a renal/carb modified diet.     Allergies as of 05/04/2018   No Known Allergies     Medication List    STOP taking these medications   amLODipine 10 MG tablet Commonly known as:  NORVASC   atenolol 100 MG tablet Commonly known as:  TENORMIN   atenolol 50 MG tablet Commonly known as:  TENORMIN   Potassium Chloride ER 20 MEQ Tbcr     TAKE these medications   acetaminophen 500 MG tablet Commonly known as:  TYLENOL Take 1,000-1,500 mg by mouth 2 (two) times daily as needed for moderate pain.   calcitRIOL 0.5 MCG capsule Commonly known as:  ROCALTROL Take 0.5 mcg by mouth 2 (two) times daily.   calcium carbonate 500 MG chewable tablet Commonly known as:  TUMS - dosed in mg elemental calcium Chew 1 tablet by mouth daily.   cinacalcet 30 MG tablet Commonly known as:  SENSIPAR Take 30 mg by mouth daily. With the largest meal of the day   colchicine 0.6 MG tablet Take 0.6-1.2 mg by mouth daily as needed (for gout flare-ups).   doxazosin 8 MG tablet Commonly known as:  CARDURA Take 8 mg by mouth every evening.   famotidine 20 MG tablet Commonly known as:  PEPCID Take 20 mg by mouth every evening.   finasteride 5 MG tablet Commonly known as:  PROSCAR Take 5 mg by mouth daily.   furosemide 80 MG tablet Commonly known as:  LASIX Take 80 mg by mouth daily. What changed:  Another medication with the same name was removed. Continue taking this medication, and follow the directions  you see here.   metFORMIN 500 MG tablet Commonly known as:  GLUCOPHAGE Take 500 mg by mouth 2 (two) times daily.   mycophenolate 250 MG capsule Commonly known as:  CELLCEPT Take 1,000 mg by mouth 2 (two) times daily.   potassium chloride SA 20 MEQ tablet Commonly known as:  K-DUR,KLOR-CON Take 20 mEq by mouth 2 (two) times daily.   predniSONE 5 MG tablet Commonly known as:  DELTASONE Take 1 tablet (5 mg total) by mouth daily with breakfast. What changed:    medication strength  how much to take  when to take this   tacrolimus 1 MG capsule Commonly known as:  PROGRAF Take 3 mg by mouth 2 (two) times daily.   tacrolimus 5 MG capsule Commonly known as:  PROGRAF Take 5 mg by mouth 2 (two) times daily.      No Known Allergies Follow-up Information    Rexene Agent, MD. Schedule an appointment as soon as possible for a visit in 1 week(s).   Specialty:  Nephrology Why:  Hospital follow up Contact information: [place] [id number] [phone]            The results of significant diagnostics from this hospitalization (including imaging, microbiology, ancillary and laboratory) are listed below for reference.    Significant Diagnostic Studies: Dg Chest Portable 1 View  Result Date: 04/30/2018 CLINICAL DATA:  Generalized weakness over the last 3 days. EXAM: PORTABLE CHEST 1 VIEW COMPARISON:  None. FINDINGS: The heart size and mediastinal contours are within normal limits. Both lungs are clear. The visualized skeletal structures are unremarkable. Surgical clips at the thoracic inlet presumably following thyroidectomy or parathyroid surgery. IMPRESSION: No active disease. Electronically Signed   By: Nelson Chimes M.D.   On: 04/30/2018 13:21    Microbiology: Recent Results (from the past 240 hour(s))  Blood Culture (routine x 2)     Status: None (Preliminary result)   Collection Time: 04/30/18 12:47 PM  Result Value Ref Range Status   Specimen  Description BLOOD LEFT ANTECUBITAL  Final   Special Requests   Final    BOTTLES DRAWN AEROBIC AND ANAEROBIC Blood Culture adequate volume   Culture   Final    NO GROWTH 3 DAYS Performed at [facility], [address] [place], [place] [address]    Report Status PENDING  Incomplete  Blood Culture (routine x 2)     Status: None (Preliminary result)   Collection Time: 04/30/18 12:52 PM  Result Value Ref Range Status   Specimen Description BLOOD LEFT ANTECUBITAL  Final   Special Requests   Final    BOTTLES DRAWN AEROBIC AND ANAEROBIC Blood Culture adequate volume   Culture   Final    NO GROWTH 3 DAYS Performed at [facility], [place] [address] [place], [place] [address]    Report Status PENDING  Incomplete  C difficile quick scan w PCR reflex     Status: None   Collection Time: 04/30/18  8:55 PM  Result Value Ref Range Status   C Diff antigen NEGATIVE NEGATIVE Final   C Diff toxin NEGATIVE NEGATIVE Final   C Diff interpretation No C. difficile detected.  Final    Comment: Performed at [facility], [place] [address] [place], [place] [address]     Labs: Basic Metabolic Panel: Recent Labs  Lab 04/30/18 1244 05/01/18 0155 05/01/18 2332 05/03/18 0638  NA 136 140 138 139  K 4.1 4.2 4.0 3.5  CL 111 117* 115* 117*  CO2 10* 11* 13* 14*  GLUCOSE 282* 161* 209* 166*  BUN 69* 71* 60* 50*  CREATININE 5.04* 4.70* 4.20* 3.37*  CALCIUM 9.2 8.8* 8.7* 8.5*   Liver Function Tests: Recent Labs  Lab 04/30/18 1244  AST 12*  ALT 10  ALKPHOS 58  BILITOT 0.9  PROT 7.0  ALBUMIN 4.1   Recent Labs  Lab 04/30/18 1244  LIPASE 172*   No results for input(s): AMMONIA in the last 168 hours. CBC: Recent Labs  Lab 04/30/18 1244 05/01/18 0155  05/01/18 2332 05/02/18 2029 05/03/18 0638 05/03/18 1227 05/04/18 0343  WBC 4.2 4.0  --  3.3*  --  3.7*  --   --   NEUTROABS 2.8  --   --  2.1  --  2.2  --   --   HGB 3.2* 4.8*  --  6.3* 8.3* 7.8* 7.8* 7.8*  HCT 10.7* 14.5*    < > 18.7* 24.6* 23.3* 23.8* 22.9*  MCV 90.7 87.9  --  82.0  --  84.1  --   --   PLT 180 107*  --  115*  --  110*  --   --    < > = values in this interval not displayed.   Cardiac Enzymes: No results for input(s): CKTOTAL, CKMB, CKMBINDEX, TROPONINI in the last  168 hours. BNP: BNP (last 3 results) No results for input(s): BNP in the last 8760 hours.  ProBNP (last 3 results) No results for input(s): PROBNP in the last 8760 hours.  CBG: Recent Labs  Lab 05/02/18 2150 05/03/18 0746 05/03/18 1232 05/03/18 1747 05/04/18 0758  GLUCAP 170* 129* 154* 170* 122*       Signed:  Johnelle Tafolla  [facility] 05/04/2018, 10:24 AM

## 2018-05-04 NOTE — Care Management Note (Signed)
Case Management Note Manya Silvas, RN MSN CM 37M [phone]  Patient Details  Name: Austin Murphy MRN: [id number] Date of Birth: January 10, 1966  Subjective/Objective:            Hypovolemic shock. Hemoglobin 3.2 on admit. PMH-renal transplant 21 years ago       Action/Plan: PTA home with brother. Multiple blood transfusions. Patient reports regular f/u with PCP. Denies transportation issues. No problems affording medications or being able to pick up medications. No DME needs. No HH needs. Will continue to follow for transition of care needs.  Expected Discharge Date:  05/04/18               Expected Discharge Plan:  Home/Self Care  In-House Referral:  NA  Discharge planning Services  CM Consult  Post Acute Care Choice:  NA Choice offered to:  NA  DME Arranged:  N/A DME Agency:  NA  HH Arranged:  NA HH Agency:  NA  Status of Service:  Completed, signed off  If discussed at [place] of Stay Meetings, dates discussed:    Additional Comments: 05/04/18 10:53 AM Bartholomew Crews, RN No new needs identified. Plan for transition home today.  Bartholomew Crews, RN 05/04/2018, 10:53 AM Case Management Note  Patient Details  Name: Austin Murphy MRN: [id number] Date of Birth: 1965/09/22  Subjective/Objective:                    Action/Plan:   Expected Discharge Date:  05/04/18               Expected Discharge Plan:  Home/Self Care  In-House Referral:  NA  Discharge planning Services  CM Consult  Post Acute Care Choice:  NA Choice offered to:  NA  DME Arranged:  N/A DME Agency:  NA  HH Arranged:  NA HH Agency:  NA  Status of Service:  Completed, signed off  If discussed at [place] of Stay Meetings, dates discussed:    Additional Comments:  Bartholomew Crews, RN 05/04/2018, 10:53 AM

## 2018-05-04 NOTE — Discharge Instructions (Signed)
Anemia Anemia is a condition in which you do not have enough red blood cells or hemoglobin. Hemoglobin is a substance in red blood cells that carries oxygen. When you do not have enough red blood cells or hemoglobin (are anemic), your body cannot get enough oxygen and your organs may not work properly. As a result, you may feel very tired or have other problems. What are the causes? Common causes of anemia include:  Excessive bleeding. Anemia can be caused by excessive bleeding inside or outside the body, including bleeding from the intestine or from periods in women.  Poor nutrition.  Long-lasting (chronic) kidney, thyroid, and liver disease.  Bone marrow disorders.  Cancer and treatments for cancer.  HIV (human immunodeficiency virus) and AIDS (acquired immunodeficiency syndrome).  Treatments for HIV and AIDS.  Spleen problems.  Blood disorders.  Infections, medicines, and autoimmune disorders that destroy red blood cells.  What are the signs or symptoms? Symptoms of this condition include:  Minor weakness.  Dizziness.  Headache.  Feeling heartbeats that are irregular or faster than normal (palpitations).  Shortness of breath, especially with exercise.  Paleness.  Cold sensitivity.  Indigestion.  Nausea.  Difficulty sleeping.  Difficulty concentrating.  Symptoms may occur suddenly or develop slowly. If your anemia is mild, you may not have symptoms. How is this diagnosed? This condition is diagnosed based on:  Blood tests.  Your medical history.  A physical exam.  Bone marrow biopsy.  Your health care provider may also check your stool (feces) for blood and may do additional testing to look for the cause of your bleeding. You may also have other tests, including:  Imaging tests, such as a CT scan or MRI.  Endoscopy.  Colonoscopy.  How is this treated? Treatment for this condition depends on the cause. If you continue to lose a lot of blood,  you may need to be treated at a hospital. Treatment may include:  Taking supplements of iron, vitamin T02, or folic acid.  Taking a hormone medicine (erythropoietin) that can help to stimulate red blood cell growth.  Having a blood transfusion. This may be needed if you lose a lot of blood.  Making changes to your diet.  Having surgery to remove your spleen.  Follow these instructions at home:  Take over-the-counter and prescription medicines only as told by your health care provider.  Take supplements only as told by your health care provider.  Follow any diet instructions that you were given.  Keep all follow-up visits as told by your health care provider. This is important. Contact a health care provider if:  You develop new bleeding anywhere in the body. Get help right away if:  You are very weak.  You are short of breath.  You have pain in your abdomen or chest.  You are dizzy or feel faint.  You have trouble concentrating.  You have bloody or black, tarry stools.  You vomit repeatedly or you vomit up blood. Summary  Anemia is a condition in which you do not have enough red blood cells or enough of a substance in your red blood cells that carries oxygen (hemoglobin).  Symptoms may occur suddenly or develop slowly.  If your anemia is mild, you may not have symptoms.  This condition is diagnosed with blood tests as well as a medical history and physical exam. Other tests may be needed.  Treatment for this condition depends on the cause of the anemia. This information is not intended to replace advice  given to you by your health care provider. Make sure you discuss any questions you have with your health care provider. Document Released: 06/17/2004 Document Revised: 06/11/2016 Document Reviewed: 06/11/2016 Elsevier Interactive Patient Education  [employer].

## 2018-05-05 LAB — CULTURE, BLOOD (ROUTINE X 2)
CULTURE: NO GROWTH
CULTURE: NO GROWTH
Special Requests: ADEQUATE
Special Requests: ADEQUATE

## 2018-05-24 DIAGNOSIS — D649 Anemia, unspecified: Secondary | ICD-10-CM

## 2018-05-24 HISTORY — DX: Anemia, unspecified: D64.9

## 2018-05-30 ENCOUNTER — Other Ambulatory Visit: Payer: Self-pay

## 2018-05-30 ENCOUNTER — Inpatient Hospital Stay (HOSPITAL_COMMUNITY)
Admission: EM | Admit: 2018-05-30 | Discharge: 2018-06-02 | DRG: 809 | Disposition: A | Discharge status: Home / Self Care | Payer: Medicare Other | Attending: Internal Medicine | Admitting: Internal Medicine

## 2018-05-30 ENCOUNTER — Encounter (HOSPITAL_COMMUNITY): Payer: Self-pay | Admitting: Emergency Medicine

## 2018-05-30 DIAGNOSIS — Z7952 Long term (current) use of systemic steroids: Secondary | ICD-10-CM

## 2018-05-30 DIAGNOSIS — E1129 Type 2 diabetes mellitus with other diabetic kidney complication: Secondary | ICD-10-CM | POA: Diagnosis not present

## 2018-05-30 DIAGNOSIS — Z940 Kidney transplant status: Secondary | ICD-10-CM

## 2018-05-30 DIAGNOSIS — E1122 Type 2 diabetes mellitus with diabetic chronic kidney disease: Secondary | ICD-10-CM | POA: Diagnosis present

## 2018-05-30 DIAGNOSIS — D649 Anemia, unspecified: Secondary | ICD-10-CM | POA: Diagnosis not present

## 2018-05-30 DIAGNOSIS — D61818 Other pancytopenia: Secondary | ICD-10-CM | POA: Diagnosis not present

## 2018-05-30 DIAGNOSIS — N400 Enlarged prostate without lower urinary tract symptoms: Secondary | ICD-10-CM | POA: Diagnosis present

## 2018-05-30 DIAGNOSIS — N184 Chronic kidney disease, stage 4 (severe): Secondary | ICD-10-CM | POA: Diagnosis present

## 2018-05-30 DIAGNOSIS — Z79899 Other long term (current) drug therapy: Secondary | ICD-10-CM

## 2018-05-30 DIAGNOSIS — IMO0002 Reserved for concepts with insufficient information to code with codable children: Secondary | ICD-10-CM | POA: Diagnosis present

## 2018-05-30 DIAGNOSIS — I10 Essential (primary) hypertension: Secondary | ICD-10-CM | POA: Diagnosis present

## 2018-05-30 DIAGNOSIS — D469 Myelodysplastic syndrome, unspecified: Secondary | ICD-10-CM | POA: Diagnosis present

## 2018-05-30 DIAGNOSIS — T8619 Other complication of kidney transplant: Secondary | ICD-10-CM | POA: Diagnosis present

## 2018-05-30 DIAGNOSIS — E1165 Type 2 diabetes mellitus with hyperglycemia: Secondary | ICD-10-CM

## 2018-05-30 DIAGNOSIS — N1330 Unspecified hydronephrosis: Secondary | ICD-10-CM | POA: Diagnosis present

## 2018-05-30 DIAGNOSIS — Z833 Family history of diabetes mellitus: Secondary | ICD-10-CM

## 2018-05-30 DIAGNOSIS — E872 Acidosis: Secondary | ICD-10-CM | POA: Diagnosis present

## 2018-05-30 DIAGNOSIS — Y830 Surgical operation with transplant of whole organ as the cause of abnormal reaction of the patient, or of later complication, without mention of misadventure at the time of the procedure: Secondary | ICD-10-CM | POA: Diagnosis present

## 2018-05-30 DIAGNOSIS — I120 Hypertensive chronic kidney disease with stage 5 chronic kidney disease or end stage renal disease: Secondary | ICD-10-CM | POA: Diagnosis present

## 2018-05-30 DIAGNOSIS — Z8249 Family history of ischemic heart disease and other diseases of the circulatory system: Secondary | ICD-10-CM

## 2018-05-30 DIAGNOSIS — Z7984 Long term (current) use of oral hypoglycemic drugs: Secondary | ICD-10-CM

## 2018-05-30 DIAGNOSIS — R197 Diarrhea, unspecified: Secondary | ICD-10-CM | POA: Diagnosis present

## 2018-05-30 HISTORY — DX: Anemia, unspecified: D64.9

## 2018-05-30 LAB — BASIC METABOLIC PANEL
Anion gap: 14 (ref 5–15)
BUN: 60 mg/dL — ABNORMAL HIGH (ref 6–20)
CO2: 12 mmol/L — ABNORMAL LOW (ref 22–32)
Calcium: 8.8 mg/dL — ABNORMAL LOW (ref 8.9–10.3)
Chloride: 107 mmol/L (ref 98–111)
Creatinine, Ser: 4.8 mg/dL — ABNORMAL HIGH (ref 0.61–1.24)
GFR calc Af Amer: 15 mL/min — ABNORMAL LOW (ref >60)
GFR, EST NON AFRICAN AMERICAN: 13 mL/min — AB (ref >60)
Glucose, Bld: 174 mg/dL — ABNORMAL HIGH (ref 70–99)
Potassium: 4.5 mmol/L (ref 3.5–5.1)
Sodium: 133 mmol/L — ABNORMAL LOW (ref 135–145)

## 2018-05-30 LAB — PREPARE RBC (CROSSMATCH)

## 2018-05-30 MED ORDER — ONDANSETRON HCL 4 MG/2ML IJ SOLN: 4.0000 mg | Freq: Four times a day (QID) | INTRAMUSCULAR | Status: DC | PRN

## 2018-05-30 MED ORDER — TACROLIMUS 1 MG PO CAPS
5.0000 mg | ORAL_CAPSULE | Freq: Two times a day (BID) | ORAL | Status: DC
  Filled 2018-05-30: qty 5

## 2018-05-30 MED ORDER — TACROLIMUS 1 MG PO CAPS
3.0000 mg | ORAL_CAPSULE | Freq: Two times a day (BID) | ORAL | Status: DC
  Filled 2018-05-30: qty 3

## 2018-05-30 MED ORDER — FINASTERIDE 5 MG PO TABS
5.0000 mg | ORAL_TABLET | Freq: Every day | ORAL | Status: DC
  Administered 2018-06-01 – 2018-06-02 (×2): 5 mg via ORAL
  Filled 2018-05-30 (×3): qty 1

## 2018-05-30 MED ORDER — CALCIUM CARBONATE ANTACID 500 MG PO CHEW
1.0000 | CHEWABLE_TABLET | Freq: Every day | ORAL | Status: DC
  Administered 2018-05-31 – 2018-06-01 (×2): 200 mg via ORAL
  Filled 2018-05-30 (×2): qty 1

## 2018-05-30 MED ORDER — CINACALCET HCL 30 MG PO TABS
30.0000 mg | ORAL_TABLET | Freq: Every day | ORAL | Status: DC
  Administered 2018-05-31 – 2018-06-01 (×2): 30 mg via ORAL
  Filled 2018-05-30 (×2): qty 1

## 2018-05-30 MED ORDER — INSULIN ASPART 100 UNIT/ML ~~LOC~~ SOLN
0.0000 [IU] | Freq: Three times a day (TID) | SUBCUTANEOUS | Status: DC
  Administered 2018-05-31 (×2): 1 [IU] via SUBCUTANEOUS
  Administered 2018-05-31 – 2018-06-02 (×5): 2 [IU] via SUBCUTANEOUS

## 2018-05-30 MED ORDER — ACETAMINOPHEN 325 MG PO TABS: 650.0000 mg | ORAL_TABLET | Freq: Four times a day (QID) | ORAL | Status: DC | PRN

## 2018-05-30 MED ORDER — FAMOTIDINE 20 MG PO TABS
20.0000 mg | ORAL_TABLET | Freq: Every evening | ORAL | Status: DC
  Administered 2018-05-31 – 2018-06-01 (×3): 20 mg via ORAL
  Filled 2018-05-30 (×3): qty 1

## 2018-05-30 MED ORDER — MYCOPHENOLATE MOFETIL 250 MG PO CAPS
1000.0000 mg | ORAL_CAPSULE | Freq: Two times a day (BID) | ORAL | Status: DC
  Administered 2018-05-31 – 2018-06-02 (×5): 1000 mg via ORAL
  Filled 2018-05-30 (×6): qty 4

## 2018-05-30 MED ORDER — ACETAMINOPHEN 650 MG RE SUPP: 650.0000 mg | Freq: Four times a day (QID) | RECTAL | Status: DC | PRN

## 2018-05-30 MED ORDER — FUROSEMIDE 80 MG PO TABS
80.0000 mg | ORAL_TABLET | Freq: Every day | ORAL | Status: DC
  Administered 2018-06-01 – 2018-06-02 (×2): 80 mg via ORAL
  Filled 2018-05-30 (×2): qty 1

## 2018-05-30 MED ORDER — ONDANSETRON HCL 4 MG PO TABS: 4.0000 mg | ORAL_TABLET | Freq: Four times a day (QID) | ORAL | Status: DC | PRN

## 2018-05-30 MED ORDER — PREDNISONE 5 MG PO TABS
5.0000 mg | ORAL_TABLET | Freq: Every day | ORAL | Status: DC
  Administered 2018-05-31 – 2018-06-02 (×3): 5 mg via ORAL
  Filled 2018-05-30 (×4): qty 1

## 2018-05-30 MED ORDER — CALCITRIOL 0.5 MCG PO CAPS
0.5000 ug | ORAL_CAPSULE | Freq: Two times a day (BID) | ORAL | Status: DC
  Administered 2018-05-31 – 2018-06-02 (×5): 0.5 ug via ORAL
  Filled 2018-05-30 (×6): qty 1

## 2018-05-30 MED ORDER — POTASSIUM CHLORIDE CRYS ER 20 MEQ PO TBCR
20.0000 meq | EXTENDED_RELEASE_TABLET | Freq: Two times a day (BID) | ORAL | Status: DC
  Administered 2018-05-31 – 2018-06-02 (×5): 20 meq via ORAL
  Filled 2018-05-30 (×5): qty 1

## 2018-05-30 MED ORDER — HEPARIN SODIUM (PORCINE) 5000 UNIT/ML IJ SOLN
5000.0000 [IU] | Freq: Three times a day (TID) | INTRAMUSCULAR | Status: DC
  Administered 2018-05-31: 5000 [IU] via SUBCUTANEOUS
  Filled 2018-05-30 (×2): qty 1

## 2018-05-30 MED ORDER — DOXAZOSIN MESYLATE 2 MG PO TABS
8.0000 mg | ORAL_TABLET | Freq: Every evening | ORAL | Status: DC
  Administered 2018-05-31 – 2018-06-01 (×2): 8 mg via ORAL
  Filled 2018-05-30: qty 1
  Filled 2018-05-30 (×2): qty 4

## 2018-05-30 MED ORDER — SODIUM CHLORIDE 0.9% IV SOLUTION
Freq: Once | INTRAVENOUS | Status: AC
  Administered 2018-05-30: 21:00:00 via INTRAVENOUS

## 2018-05-30 NOTE — H&P (Addendum)
History and Physical    Austin Murphy MVH:[id number] DOB: Feb 21, 1966 DOA: 05/30/2018  PCP: Rexene Agent, MD   Patient coming from: Home.  Chief Complaint: Low hemoglobin.  HPI: Austin Murphy is a 53 y.o. 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  Patient has not noticed any blood in the stools or black stools.  For the last 3 days he has been having some vomiting but is able to eat.  Denies any abdominal pain.  Denies any chest pain or shortness of breath dizziness or loss of consciousness.  ED Course: In the ER patient's hemoglobin was around 5 and has been admitted for PRBC transfusion and further work-up of her recurrent anemia.  Abdominal exam is benign.  Platelet is around 143 and during last admission also was lower on 110.  Review of Systems: As per HPI, rest all negative.   Past Medical History:  Diagnosis Date  . Diabetes mellitus without complication ([place])   . Hx of gout   . Hypertension   . 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 Renal disorder   . Seizures ([place])    as a child    Past Surgical History:  Procedure Laterality Date  . AV FISTULA PLACEMENT Right   . CYSTOSCOPY W/ URETERAL STENT PLACEMENT N/A 01/06/2018   Procedure: CYSTOSCOPY;  Surgeon: Festus Aloe, MD;  Location: [facility];  Service: Urology;  Laterality: N/A;  . NEPHRECTOMY TRANSPLANTED ORGAN    . THULIUM LASER TURP (TRANSURETHRAL RESECTION OF PROSTATE) N/A 01/06/2018   Procedure: THULIUM LASER TURP (TRANSURETHRAL RESECTION OF PROSTATE);  Surgeon: Festus Aloe, MD;  Location: [facility];   Service: Urology;  Laterality: N/A;  ONLY NEEDS 90 MIN TOTAL     reports that he quit smoking about 14 years ago. His smoking use included cigarettes. He smoked 0.00 packs per day for 3.00 years. He has never used smokeless tobacco. He reports previous alcohol use. He reports that he does not use drugs.  No Known Allergies  Family History  Problem Relation Age of Onset  . Diabetes Mother   . CAD Father     Prior to Admission medications   Medication Sig Start Date End Date Taking? Authorizing Provider  acetaminophen (TYLENOL) 500 MG tablet Take 1,000-1,500 mg by mouth 2 (two) times daily as needed for moderate pain.   Yes [provider]  calcitRIOL (ROCALTROL) 0.5 MCG capsule Take 0.5 mcg by mouth 2 (two) times daily.   Yes [provider]  calcium carbonate (TUMS - DOSED IN MG ELEMENTAL CALCIUM) 500 MG chewable tablet Chew 1 tablet by mouth daily.   Yes [provider]  cinacalcet (SENSIPAR) 30 MG tablet Take 30 mg by mouth daily. With the largest meal of the day   Yes [provider]  colchicine 0.6 MG tablet Take 0.6-1.2 mg by mouth daily as needed (for gout flare-ups).   Yes [provider]  doxazosin (CARDURA) 8 MG tablet Take 8 mg by mouth every evening.   Yes [provider]  famotidine (PEPCID) 20 MG tablet Take 20 mg by mouth every  evening.    Yes [provider]  finasteride (PROSCAR) 5 MG tablet Take 5 mg by mouth daily.   Yes [provider]  furosemide (LASIX) 80 MG tablet Take 80 mg by mouth daily. 04/07/18  Yes [provider]  metFORMIN (GLUCOPHAGE) 500 MG tablet Take 500 mg by mouth 2 (two) times daily. 04/18/18  Yes [provider]  mycophenolate (CELLCEPT) 250 MG capsule Take 1,000 mg by mouth 2 (two) times daily.   Yes [provider]  potassium chloride SA (K-DUR,KLOR-CON) 20 MEQ tablet Take 20 mEq by mouth 2 (two) times daily.   Yes [provider]  predniSONE  (DELTASONE) 5 MG tablet Take 1 tablet (5 mg total) by mouth daily with breakfast. 05/04/18  Yes Mikhail, Velta Addison, DO  tacrolimus (PROGRAF) 1 MG capsule Take 3 mg by mouth 2 (two) times daily.    Yes [provider]  tacrolimus (PROGRAF) 5 MG capsule Take 5 mg by mouth 2 (two) times daily.    Yes [provider]    Physical Exam: Vitals:   05/30/18 2039 05/30/18 2059 05/30/18 2130 05/30/18 2200  BP: 112/68 106/69 103/68 105/67  Pulse: 71 74 66 73  Resp: 15 13 14 16   Temp: 98.4 F (36.9 C) 98.4 F (36.9 C)    TempSrc: Oral Oral    SpO2: 100% 100% 100% 100%  Weight:      Height:          Constitutional: Moderately built and nourished. Vitals:   05/30/18 2039 05/30/18 2059 05/30/18 2130 05/30/18 2200  BP: 112/68 106/69 103/68 105/67  Pulse: 71 74 66 73  Resp: 15 13 14 16   Temp: 98.4 F (36.9 C) 98.4 F (36.9 C)    TempSrc: Oral Oral    SpO2: 100% 100% 100% 100%  Weight:      Height:       Eyes: Anicteric no pallor. ENMT: No discharge from the ears eyes nose and mouth. Neck: No mass felt.  No neck rigidity. Respiratory: No rhonchi or crepitations. Cardiovascular: S1-S2 heard. Abdomen: Soft nontender bowel sounds present. Musculoskeletal: No edema.  No joint effusion. Skin: No rash. Neurologic: Alert awake oriented to time place and person.  Moves all extremities. Psychiatric: Appears normal per normal affect.   Labs on Admission: I have personally reviewed following labs and imaging studies  CBC: Recent Labs  Lab 05/30/18 1846  WBC 4.3  HGB 5.0*  HCT 15.0*  MCV 85.2  PLT 366*   Basic Metabolic Panel: Recent Labs  Lab 05/30/18 1846  NA 133*  K 4.5  CL 107  CO2 12*  GLUCOSE 174*  BUN 60*  CREATININE 4.80*  CALCIUM 8.8*   GFR: Estimated Creatinine Clearance: 16.1 mL/min (A) (by C-G formula based on SCr of 4.8 mg/dL (H)). Liver Function Tests: No results for input(s): AST, ALT, ALKPHOS, BILITOT, PROT, ALBUMIN in the last 168  hours. No results for input(s): LIPASE, AMYLASE in the last 168 hours. No results for input(s): AMMONIA in the last 168 hours. Coagulation Profile: No results for input(s): INR, PROTIME in the last 168 hours. Cardiac Enzymes: No results for input(s): CKTOTAL, CKMB, CKMBINDEX, TROPONINI in the last 168 hours. BNP (last 3 results) No results for input(s): PROBNP in the last 8760 hours. HbA1C: No results for input(s): HGBA1C in the last 72 hours. CBG: No results for input(s): GLUCAP in the last 168 hours. Lipid Profile: No results for input(s): CHOL, HDL, LDLCALC, TRIG, CHOLHDL, LDLDIRECT in the last 72  hours. Thyroid Function Tests: No results for input(s): TSH, T4TOTAL, FREET4, T3FREE, THYROIDAB in the last 72 hours. Anemia Panel: Recent Labs    05/30/18 1846  RETICCTPCT 0.2*   Urine analysis:    Component Value Date/Time   COLORURINE YELLOW 04/30/2018 2055   APPEARANCEUR CLEAR 04/30/2018 2055   LABSPEC 1.011 04/30/2018 2055   PHURINE 5.0 04/30/2018 2055   GLUCOSEU NEGATIVE 04/30/2018 2055   HGBUR NEGATIVE 04/30/2018 2055   BILIRUBINUR NEGATIVE 04/30/2018 2055   KETONESUR NEGATIVE 04/30/2018 2055   PROTEINUR NEGATIVE 04/30/2018 2055   UROBILINOGEN 0.2 07/09/2014 0932   NITRITE NEGATIVE 04/30/2018 2055   LEUKOCYTESUR NEGATIVE 04/30/2018 2055   Sepsis Labs: @LABRCNTIP (procalcitonin:4,lacticidven:4) )No results found for this or any previous visit (from the past 240 hour(s)).   Radiological Exams on Admission: No results found.    Assessment/Plan Principal Problem:   Symptomatic anemia Active Problems:   Renal transplant recipient   Uncontrolled hypertension   DM (diabetes mellitus), type 2, uncontrolled, with renal complications ([place])    1. Recurrent anemia -cause not clear 2 units of PRBC transfusion has been ordered follow CBC.  May discuss with hematologist in the morning. 2. Chronic kidney disease stage IV with renal transplant -creatinine appears to be at  baseline.  Patient is on Lasix calcitriol and also on Prograf and CellCept and prednisone. 3. Nausea vomiting -denies any abdominal pain.  Denies any blood in the vomitus.  Abdomen appears benign.  Continue to observe. 4. Diabetes mellitus type 2 we will keep patient on sliding scale coverage. 5. Hypertension on Cardura. 6. BPH on finasteride. 7. Pancytopenia appears to be chronic.  Will need follow-up with hematologist.   DVT prophylaxis: SCDs. Code Status: Full code. Family Communication: Discussed with patient.   Disposition Plan: Home. Consults called: None. Admission status: Observation.   Rise Patience MD [facility] Pager [phone].  If 7PM-7AM, please contact night-coverage [email or web address] Password [facility]  05/30/2018, 10:56 PM

## 2018-05-30 NOTE — ED Provider Notes (Signed)
[facility] [place] EMERGENCY DEPARTMENT Provider Note   CSN: [id number] Arrival date & time: 05/30/18  [facility]     History   Chief Complaint Chief Complaint  Patient presents with  . Other    Low Hgb    HPI Austin Murphy is a 53 y.o. male.  Patient with history of diabetes, hypertension, end-stage renal disease status post kidney transplant, on antirejection meds --presents to the emergency department with low hemoglobin.  Patient was admitted to the hospital in 04/2018 after hemoglobin found to be 3.2.  Patient had a work-up.  Was found to have low reticulocytes and suspicion of a hypoproliferative anemia.  Per hematology notes, it appears patient was to receive Epo shots by nephrology.  Plan was for follow-up with hematology in 6 weeks for consideration of bone marrow biopsy.  Patient states that he went to [facility] today and was found to have a hemoglobin in the fours.  He was sent to the emergency department for treatment.  Patient denies dark or bloody stools.  No nausea, vomiting.  He does report diarrhea.  Also endorses mild generalized weakness, exertional shortness of breath.  He denies syncope.  No chest pain.  The onset of this condition was acute. The course is constant. Aggravating factors: none. Alleviating factors: none.       Past Medical History:  Diagnosis Date  . Diabetes mellitus without complication ([place])   . Hx of gout   . Hypertension   . 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 Renal disorder   . Seizures ([place])    as a child    Patient Active Problem List   Diagnosis Date Noted  . 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 Hypovolemic shock ([place]) 04/30/2018  . Hypothermia due to non-environmental cause 04/30/2018  . Diarrhea 04/30/2018  . Anemia 02/28/2018  . Near syncope 07/09/2014  . Renal transplant recipient 07/09/2014  . Uncontrolled hypertension 07/09/2014  . Orthostatic hypotension  07/09/2014  . Chronic kidney disease, stage 4, severely decreased GFR ([place]) 07/09/2014  . DM (diabetes mellitus), type 2, uncontrolled, with renal complications ([place]) 93/26/7124    Past Surgical History:  Procedure Laterality Date  . AV FISTULA PLACEMENT Right   . CYSTOSCOPY W/ URETERAL STENT PLACEMENT N/A 01/06/2018   Procedure: CYSTOSCOPY;  Surgeon: Festus Aloe, MD;  Location: [facility];  Service: Urology;  Laterality: N/A;  . NEPHRECTOMY TRANSPLANTED ORGAN    . THULIUM LASER TURP (TRANSURETHRAL RESECTION OF PROSTATE) N/A 01/06/2018   Procedure: THULIUM LASER TURP (TRANSURETHRAL RESECTION OF PROSTATE);  Surgeon: Festus Aloe, MD;  Location: [facility];  Service: Urology;  Laterality: N/A;  ONLY NEEDS 90 MIN TOTAL        Home Medications    Prior to Admission medications   Medication Sig Start Date End Date Taking? Authorizing Provider  acetaminophen (TYLENOL) 500 MG tablet Take 1,000-1,500 mg by mouth 2 (two) times daily as needed for moderate pain.    [provider]  calcitRIOL (ROCALTROL) 0.5 MCG capsule Take 0.5 mcg by mouth 2 (two) times daily.    [provider]  calcium carbonate (TUMS - DOSED IN MG ELEMENTAL CALCIUM) 500 MG chewable tablet Chew 1 tablet by mouth daily.    [provider]  cinacalcet (SENSIPAR) 30 MG tablet Take 30 mg by mouth daily. With the largest meal of the day    [provider]  colchicine 0.6 MG tablet Take 0.6-1.2 mg by  mouth daily as needed (for gout flare-ups).    [provider]  doxazosin (CARDURA) 8 MG tablet Take 8 mg by mouth every evening.    [provider]  famotidine (PEPCID) 20 MG tablet Take 20 mg by mouth every evening.     [provider]  finasteride (PROSCAR) 5 MG tablet Take 5 mg by mouth daily.    [provider]  furosemide (LASIX) 80 MG tablet Take 80 mg by mouth daily. 04/07/18   [provider]  metFORMIN (GLUCOPHAGE) 500 MG tablet Take 500 mg by  mouth 2 (two) times daily. 04/18/18   [provider]  mycophenolate (CELLCEPT) 250 MG capsule Take 1,000 mg by mouth 2 (two) times daily.    [provider]  potassium chloride SA (K-DUR,KLOR-CON) 20 MEQ tablet Take 20 mEq by mouth 2 (two) times daily.    [provider]  predniSONE (DELTASONE) 5 MG tablet Take 1 tablet (5 mg total) by mouth daily with breakfast. 05/04/18   Cristal Ford, DO  tacrolimus (PROGRAF) 1 MG capsule Take 3 mg by mouth 2 (two) times daily.     [provider]  tacrolimus (PROGRAF) 5 MG capsule Take 5 mg by mouth 2 (two) times daily.     [provider]    Family History Family History  Problem Relation Age of Onset  . Diabetes Mother   . CAD Father     Social History Social History   Tobacco Use  . Smoking status: Former Smoker    Packs/day: 0.00    Years: 3.00    Pack years: 0.00    Types: Cigarettes    Last attempt to quit: 05/24/2004    Years since quitting: 14.0  . Smokeless tobacco: Never Used  Substance Use Topics  . Alcohol use: Not Currently    Comment: quit 8 years ago   . Drug use: No     Allergies   Patient has no known allergies.   Review of Systems Review of Systems  Constitutional: Positive for fatigue. Negative for fever.  HENT: Negative for rhinorrhea and sore throat.   Eyes: Negative for redness.  Respiratory: Positive for shortness of breath. Negative for cough.   Cardiovascular: Negative for chest pain.  Gastrointestinal: Positive for diarrhea. Negative for abdominal pain, blood in stool, nausea and vomiting.  Genitourinary: Negative for dysuria.  Musculoskeletal: Negative for myalgias.  Skin: Negative for rash.  Neurological: Positive for weakness (generalized) and light-headedness. Negative for headaches.     Physical Exam Updated Vital Signs BP 115/71   Temp 98.1 F (36.7 C) (Oral)   Ht 5' 6"  (1.676 m)   Wt 72.6 kg   BMI 25.82 kg/m   Physical Exam Vitals signs  and nursing note reviewed.  Constitutional:      Appearance: He is well-developed.  HENT:     Head: Normocephalic and atraumatic.  Eyes:     General:        Right eye: No discharge.        Left eye: No discharge.     Comments: Pale conjunctiva.   Neck:     Musculoskeletal: Normal range of motion and neck supple.  Cardiovascular:     Rate and Rhythm: Normal rate and regular rhythm.     Heart sounds: Normal heart sounds.  Pulmonary:     Effort: Pulmonary effort is normal.     Breath sounds: Normal breath sounds.  Abdominal:     Palpations: Abdomen is soft.  Tenderness: There is no abdominal tenderness.  Skin:    General: Skin is warm and dry.  Neurological:     Mental Status: He is alert.      ED Treatments / Results  Labs (all labs ordered are listed, but only abnormal results are displayed) Labs Reviewed  CBC - Abnormal; Notable for the following components:      Result Value   RBC 1.76 (*)    Hemoglobin 5.0 (*)    HCT 15.0 (*)    Platelets 143 (*)    All other components within normal limits  BASIC METABOLIC PANEL - Abnormal; Notable for the following components:   Sodium 133 (*)    CO2 12 (*)    Glucose, Bld 174 (*)    BUN 60 (*)    Creatinine, Ser 4.80 (*)    Calcium 8.8 (*)    GFR calc non Af Amer 13 (*)    GFR calc Af Amer 15 (*)    All other components within normal limits  RETICULOCYTES - Abnormal; Notable for the following components:   Retic Ct Pct 0.2 (*)    RBC. 1.76 (*)    Retic Count, Absolute 3.5 (*)    All other components within normal limits  TYPE AND SCREEN  PREPARE RBC (CROSSMATCH)    EKG None  Radiology No results found.  Procedures Procedures (including critical care time)  Medications Ordered in ED Medications - No data to display   Initial Impression / Assessment and Plan / ED Course  I have reviewed the triage vital signs and the nursing notes.  Pertinent labs & imaging results that were available during my care of  the patient were reviewed by me and considered in my medical decision making (see chart for details).     Patient seen and examined. Work-up initiated. Reviewed previous hospital records.   Vital signs reviewed and are as follows: BP 115/71   Temp 98.1 F (36.7 C) (Oral)   Ht 5' 6"  (1.676 m)   Wt 72.6 kg   BMI 25.82 kg/m   8:16 PM Pt updated. Spoke with Dr. Hal Hope who will see patient. Blood transfusion ordered.   Critical care: none.   Final Clinical Impressions(s) / ED Diagnoses   Final diagnoses:  Symptomatic anemia   Admit. Pt stable.   ED Discharge Orders    None       Carlisle Cater, Hershal Coria 05/30/18 2017    Isla Pence, MD 05/30/18 2022

## 2018-05-30 NOTE — ED Triage Notes (Signed)
Per EMS pt has hgb 4.4 at dr office and c/o fatigue. Pt has 2 kidney transplants in 1980. 1 episode N/V last night and diarrhea. Denies blood in stool or emesis.

## 2018-05-31 ENCOUNTER — Encounter (HOSPITAL_COMMUNITY): Payer: Self-pay | Admitting: General Practice

## 2018-05-31 ENCOUNTER — Other Ambulatory Visit: Payer: Self-pay | Admitting: Student

## 2018-05-31 DIAGNOSIS — N184 Chronic kidney disease, stage 4 (severe): Secondary | ICD-10-CM | POA: Diagnosis not present

## 2018-05-31 DIAGNOSIS — I10 Essential (primary) hypertension: Secondary | ICD-10-CM | POA: Diagnosis not present

## 2018-05-31 DIAGNOSIS — D649 Anemia, unspecified: Secondary | ICD-10-CM | POA: Diagnosis not present

## 2018-05-31 LAB — RETICULOCYTES
RBC.: 1.76 MIL/uL — ABNORMAL LOW (ref 4.22–5.81)
Retic Ct Pct: <0.4 % — ABNORMAL LOW (ref 0.4–3.1)

## 2018-05-31 LAB — BASIC METABOLIC PANEL
Anion gap: 10 (ref 5–15)
BUN: 61 mg/dL — ABNORMAL HIGH (ref 6–20)
CO2: 13 mmol/L — AB (ref 22–32)
Calcium: 8.4 mg/dL — ABNORMAL LOW (ref 8.9–10.3)
Chloride: 110 mmol/L (ref 98–111)
Creatinine, Ser: 4.7 mg/dL — ABNORMAL HIGH (ref 0.61–1.24)
GFR calc non Af Amer: 13 mL/min — ABNORMAL LOW (ref >60)
GFR, EST AFRICAN AMERICAN: 15 mL/min — AB (ref >60)
Glucose, Bld: 154 mg/dL — ABNORMAL HIGH (ref 70–99)
Potassium: 4.2 mmol/L (ref 3.5–5.1)
Sodium: 133 mmol/L — ABNORMAL LOW (ref 135–145)

## 2018-05-31 LAB — CBC
HCT: 15 % — ABNORMAL LOW (ref 39.0–52.0)
HCT: 18.4 % — ABNORMAL LOW (ref 39.0–52.0)
Hemoglobin: 5 g/dL — CL (ref 13.0–17.0)
Hemoglobin: 6.3 g/dL — CL (ref 13.0–17.0)
MCH: 28.4 pg (ref 26.0–34.0)
MCH: 29.6 pg (ref 26.0–34.0)
MCHC: 33.3 g/dL (ref 30.0–36.0)
MCHC: 34.2 g/dL (ref 30.0–36.0)
MCV: 85.2 fL (ref 80.0–100.0)
MCV: 86.4 fL (ref 80.0–100.0)
NRBC: 0 % (ref 0.0–0.2)
Platelets: 143 10*3/uL — ABNORMAL LOW (ref 150–400)
Platelets: 101 10*3/uL — ABNORMAL LOW (ref 150–400)
RBC: 1.76 MIL/uL — ABNORMAL LOW (ref 4.22–5.81)
RBC: 2.13 MIL/uL — AB (ref 4.22–5.81)
RDW: 14.3 % (ref 11.5–15.5)
RDW: 14.2 % (ref 11.5–15.5)
WBC: 4.3 10*3/uL (ref 4.0–10.5)
WBC: 3.7 10*3/uL — ABNORMAL LOW (ref 4.0–10.5)
nRBC: 0 % (ref 0.0–0.2)

## 2018-05-31 LAB — TSH: TSH: 0.326 u[IU]/mL — ABNORMAL LOW (ref 0.350–4.500)

## 2018-05-31 LAB — GLUCOSE, CAPILLARY
GLUCOSE-CAPILLARY: 160 mg/dL — AB (ref 70–99)
Glucose-Capillary: 134 mg/dL — ABNORMAL HIGH (ref 70–99)
Glucose-Capillary: 162 mg/dL — ABNORMAL HIGH (ref 70–99)

## 2018-05-31 LAB — HEMOGLOBIN AND HEMATOCRIT, BLOOD
HCT: 17.9 % — ABNORMAL LOW (ref 39.0–52.0)
HCT: 18.1 % — ABNORMAL LOW (ref 39.0–52.0)
HEMOGLOBIN: 6 g/dL — AB (ref 13.0–17.0)
Hemoglobin: 6.3 g/dL — CL (ref 13.0–17.0)

## 2018-05-31 LAB — CBG MONITORING, ED: Glucose-Capillary: 134 mg/dL — ABNORMAL HIGH (ref 70–99)

## 2018-05-31 MED ORDER — TACROLIMUS 1 MG PO CAPS
8.0000 mg | ORAL_CAPSULE | Freq: Two times a day (BID) | ORAL | Status: DC
  Administered 2018-05-31 – 2018-06-02 (×5): 8 mg via ORAL
  Filled 2018-05-31 (×6): qty 8

## 2018-05-31 NOTE — Progress Notes (Signed)
New Admission Note:   Arrival Method: Bed from ED Mental Orientation: alert and oriented x4  Telemetry: Yes, box: 19 NSR Assessment: Completed Skin: Intact IV: LFA NSL Pain: 0 Tubes: None Safety Measures: Safety Fall Prevention Plan has been discussed  Admission: completed  5 [place] Orientation: Patient has been orientated to the room, unit and staff.   Family: None at this time  Orders to be reviewed and implemented. Will continue to monitor the patient. Call light has been placed within reach and bed alarm has been activated.   Baldo Ash, RN

## 2018-05-31 NOTE — Progress Notes (Signed)
PROGRESS NOTE                                                                                                                                                                                                             Patient Demographics:    Austin Murphy, is a 53 y.o. male, DOB - Mar 26, 1966, TJL:[id number]  Admit date - 05/30/2018   Admitting Physician No admitting provider for patient encounter.  Outpatient Primary MD for the patient is Rexene Agent, MD  LOS - 0  Outpatient Specialists: None  Chief Complaint  Patient presents with  . Other    Low Hgb       Brief Narrative   53 year old male with history of renal transplant, diabetes mellitus type 2, hypertension, BPH, chronic kidney disease stage IV who was admitted 1 month back with severe anemia and received 6 unit PRBC.  He was seen by hematologist with plan on outpatient follow-up in about 6-8 weeks (plan plan on bone marrow biopsy if he did not respond to erythropointin). Patient saw his PCP on the day of admission and found to have hemoglobin around 4 and was instructed to come to the ED.  She denies noticing any blood in stool or dark stool, hematemesis or hematuria.  He does report dizziness and some lightheadedness but no chest pain, shortness of breath or abdominal pain. In the ED hemoglobin was 5.  Ordered for 2 unit PRBC and placed on observation.   Subjective:   Feeling tired , dizzy prior to admission   Assessment  & Plan :    Principal Problem:   Symptomatic anemia 2 unit PRBC ordered on admission.  Serial H&H.  Avoid NSAIDs.  Hematologist (Dr [name]) unsalted.  Agrees with getting bone marrow biopsy.  IR consulted and planned for tomorrow.  Active Problems:   Renal transplant recipient and chronic kidney disease stage IV Renal function at baseline (4-5).  Continue CellCept, prednisone, Prograf , Sensipar and Lasix.  Check Prograf  level.  Diarrhea Was noted during last hospitalization.  stool for C. difficile was negative.  Patient informed his PCP of having frequent diarrhea and >20 pound weight loss in the past few months.  He denied having diarrhea to me this morning. Check stool for Hemoccult stool culture, stool fecal fat and HIV antibody.  Will consult GI if symptoms  persistent.    Uncontrolled hypertension Currently stable.  Resume home medications    DM (diabetes mellitus), type 2, uncontrolled, with renal complications (HCC) Monitor on sliding scale coverage.  Metabolic acidosis Bicarb of 13.  Check repeat lab and add bicarb supplement if still low.   Code Status : Full code  Family Communication  : None at bedside  Disposition Plan  : Home pending work-up (bone marrow biopsy and stable H&H.)  Barriers For Discharge :   Consults  : IR, hematology  Procedures  : None  DVT Prophylaxis  : SCDs  Lab Results  Component Value Date   PLT 101 (L) 05/31/2018    Antibiotics  :    Anti-infectives (From admission, onward)   None        Objective:   Vitals:   05/31/18 0530 05/31/18 0600 05/31/18 0630 05/31/18 0830  BP: 111/76 115/71 113/73 (!) 120/54  Pulse: 66 69 65 65  Resp: 14 15 13 17   Temp:      TempSrc:      SpO2: 100% 100% 100% 100%  Weight:      Height:        Wt Readings from Last 3 Encounters:  05/30/18 72.6 kg  05/01/18 67.8 kg  02/28/18 74.7 kg     Intake/Output Summary (Last 24 hours) at 05/31/2018 0927 Last data filed at 05/30/2018 2319 Gross per 24 hour  Intake 310 ml  Output -  Net 310 ml     Physical Exam  Gen: not in distress HEENT: Pallor present, moist mucosa, supple neck Chest: clear b/l, no added sounds CVS: N S1&S2, no murmurs, rubs or gallop GI: soft, NT, ND, BS+ Musculoskeletal: warm, no edema     Data Review:    CBC Recent Labs  Lab 05/30/18 1846 05/31/18 0423  WBC 4.3 3.7*  HGB 5.0* 6.3*  HCT 15.0* 18.4*  PLT 143* 101*  MCV 85.2  86.4  MCH 28.4 29.6  MCHC 33.3 34.2  RDW 14.3 14.2    Chemistries  Recent Labs  Lab 05/30/18 1846 05/31/18 0423  NA 133* 133*  K 4.5 4.2  CL 107 110  CO2 12* 13*  GLUCOSE 174* 154*  BUN 60* 61*  CREATININE 4.80* 4.70*  CALCIUM 8.8* 8.4*   ------------------------------------------------------------------------------------------------------------------ No results for input(s): CHOL, HDL, LDLCALC, TRIG, CHOLHDL, LDLDIRECT in the last 72 hours.  Lab Results  Component Value Date   HGBA1C 6.2 (H) 12/30/2017   ------------------------------------------------------------------------------------------------------------------ No results for input(s): TSH, T4TOTAL, T3FREE, THYROIDAB in the last 72 hours.  Invalid input(s): FREET3 ------------------------------------------------------------------------------------------------------------------ Recent Labs    05/30/18 1846  RETICCTPCT 0.2*    Coagulation profile No results for input(s): INR, PROTIME in the last 168 hours.  No results for input(s): DDIMER in the last 72 hours.  Cardiac Enzymes No results for input(s): CKMB, TROPONINI, MYOGLOBIN in the last 168 hours.  Invalid input(s): CK ------------------------------------------------------------------------------------------------------------------ No results found for: BNP  Inpatient Medications  Scheduled Meds: . calcitRIOL  0.5 mcg Oral BID  . calcium carbonate  1 tablet Oral QAC supper  . cinacalcet  30 mg Oral QAC supper  . doxazosin  8 mg Oral QPM  . famotidine  20 mg Oral QPM  . finasteride  5 mg Oral Daily  . furosemide  80 mg Oral Daily  . heparin  5,000 Units Subcutaneous Q8H  . insulin aspart  0-9 Units Subcutaneous TID WC  . mycophenolate  1,000 mg Oral BID  . potassium chloride SA  20  mEq Oral BID  . predniSONE  5 mg Oral Q breakfast  . tacrolimus  8 mg Oral BID   Continuous Infusions: PRN Meds:.acetaminophen **OR** acetaminophen, ondansetron  **OR** ondansetron (ZOFRAN) IV  Micro Results No results found for this or any previous visit (from the past 240 hour(s)).  Radiology Reports No results found.  Time Spent in minutes  25   Jonathandavid Marlett M.D on 05/31/2018 at 9:27 AM  Between 7am to 7pm - Pager - [phone]  After 7pm go to [email or web address] - password [facility]  [facility] -  Office  [phone]

## 2018-05-31 NOTE — ED Notes (Signed)
Attempted calling report x 1

## 2018-05-31 NOTE — Progress Notes (Signed)
Chief Complaint: Patient was seen in consultation today for bone marrow biopsy at the request of Dr. Flonnie Overman Dhungel  Referring Physician(s):  Dr. Flonnie Overman Dhungel  Supervising Physician: Markus Daft  Patient Status: [facility] - [facility]  History of Present Illness: Austin Murphy is a 53 y.o. male with history of renal transplant, diabetes mellitus type 2, hypertension, BPH, chronic kidney disease stage IV who was admitted last month for severe anemia and was followed by hematologist and felt that patient has hypoproliferative anemia. He has now been admitted again with symptomatic anemia including dizziness and fatigue. The medical team has asked IR to perform bone marrow biopsy as part of his ongoing workup. PMHx, meds, labs, imaging, allergies reviewed. Feels well otherwise, no recent fevers, chills, illness.    Past Medical History:  Diagnosis Date  . Diabetes mellitus without complication ([place])   . Hx of gout   . Hypertension   . 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 Renal disorder   . Seizures ([place])    as a child  . Symptomatic anemia 05/2018    Past Surgical History:  Procedure Laterality Date  . AV FISTULA PLACEMENT Right   . CYSTOSCOPY W/ URETERAL STENT PLACEMENT N/A 01/06/2018   Procedure: CYSTOSCOPY;  Surgeon: Festus Aloe, MD;  Location: [facility];  Service: Urology;  Laterality: N/A;  . NEPHRECTOMY TRANSPLANTED ORGAN    . THULIUM LASER TURP (TRANSURETHRAL RESECTION OF PROSTATE) N/A 01/06/2018   Procedure: THULIUM LASER TURP (TRANSURETHRAL RESECTION OF PROSTATE);  Surgeon: Festus Aloe, MD;  Location: [facility];  Service: Urology;  Laterality: N/A;  ONLY NEEDS 90 MIN TOTAL    Allergies: Patient has no known allergies.  Medications: Prior to Admission medications   Medication Sig Start Date End Date Taking? Authorizing Provider  acetaminophen (TYLENOL) 500 MG tablet Take 1,000-1,500 mg by mouth 2 (two) times daily as needed for moderate pain.    Yes [provider]  calcitRIOL (ROCALTROL) 0.5 MCG capsule Take 0.5 mcg by mouth 2 (two) times daily.   Yes [provider]  calcium carbonate (TUMS - DOSED IN MG ELEMENTAL CALCIUM) 500 MG chewable tablet Chew 1 tablet by mouth daily.   Yes [provider]  cinacalcet (SENSIPAR) 30 MG tablet Take 30 mg by mouth daily. With the largest meal of the day   Yes [provider]  colchicine 0.6 MG tablet Take 0.6-1.2 mg by mouth daily as needed (for gout flare-ups).   Yes [provider]  doxazosin (CARDURA) 8 MG tablet Take 8 mg by mouth every evening.   Yes [provider]  famotidine (PEPCID) 20 MG tablet Take 20 mg by mouth every evening.    Yes [provider]  finasteride (PROSCAR) 5 MG tablet Take 5 mg by mouth daily.   Yes [provider]  furosemide (LASIX) 80 MG tablet Take 80 mg by mouth daily. 04/07/18  Yes [provider]  metFORMIN (GLUCOPHAGE) 500 MG tablet Take 500 mg by mouth 2 (two) times daily. 04/18/18  Yes [provider]  mycophenolate (CELLCEPT) 250 MG capsule Take 1,000 mg by mouth 2 (two) times daily.   Yes [provider]  potassium chloride SA (K-DUR,KLOR-CON) 20 MEQ tablet Take 20 mEq by mouth 2 (two) times daily.   Yes [provider]  predniSONE (DELTASONE) 5 MG tablet Take 1 tablet (5 mg total) by mouth daily with breakfast. 05/04/18  Yes Mikhail, Velta Addison, DO  tacrolimus (PROGRAF) 1 MG capsule Take 3  mg by mouth 2 (two) times daily.    Yes [provider]  tacrolimus (PROGRAF) 5 MG capsule Take 5 mg by mouth 2 (two) times daily.    Yes [provider]     Family History  Problem Relation Age of Onset  . Diabetes Mother   . CAD Father     Social History   Socioeconomic History  . Marital status: Single    Spouse name: Not on file  . Number of children: Not on file  . Years of education: Not on file  . Highest education level: Not on file    Occupational History  . Not on file  Social Needs  . Financial resource strain: Not on file  . Food insecurity:    Worry: Not on file    Inability: Not on file  . Transportation needs:    Medical: Not on file    Non-medical: Not on file  Tobacco Use  . Smoking status: Former Smoker    Packs/day: 0.00    Years: 3.00    Pack years: 0.00    Types: Cigarettes    Last attempt to quit: 05/24/2004    Years since quitting: 14.0  . Smokeless tobacco: Never Used  Substance and Sexual Activity  . Alcohol use: Not Currently    Comment: quit 8 years ago   . Drug use: No  . Sexual activity: Not on file  Lifestyle  . Physical activity:    Days per week: Not on file    Minutes per session: Not on file  . Stress: Not on file  Relationships  . Social connections:    Talks on phone: Not on file    Gets together: Not on file    Attends religious service: Not on file    Active member of club or organization: Not on file    Attends meetings of clubs or organizations: Not on file    Relationship status: Not on file  Other Topics Concern  . Not on file  Social History Narrative   Stays with his brother. Independent at baseline     Review of Systems: A 12 point ROS discussed and pertinent positives are indicated in the HPI above.  All other systems are negative.  Review of Systems  Vital Signs: BP 128/65 (BP Location: Right Arm)   Pulse 62   Temp 97.9 F (36.6 C) (Oral)   Resp 18   Ht _0  (1.676 m)   Wt 72.6 kg   SpO2 100%   BMI 25.82 kg/m   Physical Exam Constitutional:      Appearance: Normal appearance.  HENT:     Mouth/Throat:     Mouth: Mucous membranes are moist.     Pharynx: Oropharynx is clear.  Cardiovascular:     Rate and Rhythm: Normal rate and regular rhythm.     Heart sounds: Normal heart sounds.  Pulmonary:     Effort: Pulmonary effort is normal. No respiratory distress.     Breath sounds: Normal breath sounds.  Neurological:     General: No focal  deficit present.     Mental Status: He is alert and oriented to person, place, and time.  Psychiatric:        Mood and Affect: Mood normal.        Judgment: Judgment normal.       Imaging: No results found.  Labs:  CBC: Recent Labs    05/01/18 2332  05/03/18 1740 05/03/18 1227 05/04/18 0343  05/30/18 1846 05/31/18 0423  WBC 3.3*  --  3.7*  --   --  4.3 3.7*  HGB 6.3*   < > 7.8* 7.8* 7.8* 5.0* 6.3*  HCT 18.7*   < > 23.3* 23.8* 22.9* 15.0* 18.4*  PLT 115*  --  110*  --   --  143* 101*   < > = values in this interval not displayed.    COAGS: No results for input(s): INR, APTT in the last 8760 hours.  BMP: Recent Labs    05/01/18 2332 05/03/18 0638 05/30/18 1846 05/31/18 0423  NA 138 139 133* 133*  K 4.0 3.5 4.5 4.2  CL 115* 117* 107 110  CO2 13* 14* 12* 13*  GLUCOSE 209* 166* 174* 154*  BUN 60* 50* 60* 61*  CALCIUM 8.7* 8.5* 8.8* 8.4*  CREATININE 4.20* 3.37* 4.80* 4.70*  GFRNONAA 15* 20* 13* 13*  GFRAA 17* 23* 15* 15*    LIVER FUNCTION TESTS: Recent Labs    04/30/18 1244  BILITOT 0.9  AST 12*  ALT 10  ALKPHOS 58  PROT 7.0  ALBUMIN 4.1    TUMOR MARKERS: No results for input(s): AFPTM, CEA, CA199, CHROMGRNA in the last 8760 hours.  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 Plan for bone marrow biopsy tomorrow. NPO p MN Risks and benefits discussed with the patient including, but not limited to bleeding, infection, damage to adjacent structures or low yield requiring additional tests.  All of the patient's questions were answered, patient is agreeable to proceed. Consent signed and in chart.   Thank you for this interesting consult.  I greatly enjoyed meeting Mayjor Szczepanik and look forward to participating in their care.  A copy of this report was sent to the requesting provider on this date.  Electronically Signed: Ascencion Dike, PA-C 05/31/2018, 1:21 PM   I spent a total of 20 minutes in face to  face in clinical consultation, greater than 50% of which was counseling/coordinating care for bone marrow biopsy

## 2018-05-31 NOTE — Progress Notes (Signed)
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

## 2018-06-01 ENCOUNTER — Ambulatory Visit (HOSPITAL_COMMUNITY): Payer: Medicare Other

## 2018-06-01 ENCOUNTER — Inpatient Hospital Stay (HOSPITAL_COMMUNITY): Payer: Medicare Other

## 2018-06-01 DIAGNOSIS — Z7984 Long term (current) use of oral hypoglycemic drugs: Secondary | ICD-10-CM | POA: Diagnosis not present

## 2018-06-01 DIAGNOSIS — E1129 Type 2 diabetes mellitus with other diabetic kidney complication: Secondary | ICD-10-CM | POA: Diagnosis not present

## 2018-06-01 DIAGNOSIS — Z940 Kidney transplant status: Secondary | ICD-10-CM | POA: Diagnosis not present

## 2018-06-01 DIAGNOSIS — R197 Diarrhea, unspecified: Secondary | ICD-10-CM | POA: Diagnosis present

## 2018-06-01 DIAGNOSIS — I129 Hypertensive chronic kidney disease with stage 1 through stage 4 chronic kidney disease, or unspecified chronic kidney disease: Secondary | ICD-10-CM

## 2018-06-01 DIAGNOSIS — D649 Anemia, unspecified: Secondary | ICD-10-CM | POA: Diagnosis present

## 2018-06-01 DIAGNOSIS — E1122 Type 2 diabetes mellitus with diabetic chronic kidney disease: Secondary | ICD-10-CM | POA: Diagnosis present

## 2018-06-01 DIAGNOSIS — Z833 Family history of diabetes mellitus: Secondary | ICD-10-CM | POA: Diagnosis not present

## 2018-06-01 DIAGNOSIS — T8619 Other complication of kidney transplant: Secondary | ICD-10-CM | POA: Diagnosis present

## 2018-06-01 DIAGNOSIS — N1330 Unspecified hydronephrosis: Secondary | ICD-10-CM

## 2018-06-01 DIAGNOSIS — D61818 Other pancytopenia: Secondary | ICD-10-CM | POA: Diagnosis present

## 2018-06-01 DIAGNOSIS — E872 Acidosis: Secondary | ICD-10-CM | POA: Diagnosis present

## 2018-06-01 DIAGNOSIS — Z7952 Long term (current) use of systemic steroids: Secondary | ICD-10-CM | POA: Diagnosis not present

## 2018-06-01 DIAGNOSIS — N179 Acute kidney failure, unspecified: Secondary | ICD-10-CM | POA: Diagnosis not present

## 2018-06-01 DIAGNOSIS — Y830 Surgical operation with transplant of whole organ as the cause of abnormal reaction of the patient, or of later complication, without mention of misadventure at the time of the procedure: Secondary | ICD-10-CM | POA: Diagnosis present

## 2018-06-01 DIAGNOSIS — Z79899 Other long term (current) drug therapy: Secondary | ICD-10-CM | POA: Diagnosis not present

## 2018-06-01 DIAGNOSIS — I120 Hypertensive chronic kidney disease with stage 5 chronic kidney disease or end stage renal disease: Secondary | ICD-10-CM | POA: Diagnosis present

## 2018-06-01 DIAGNOSIS — N400 Enlarged prostate without lower urinary tract symptoms: Secondary | ICD-10-CM | POA: Diagnosis present

## 2018-06-01 DIAGNOSIS — D469 Myelodysplastic syndrome, unspecified: Secondary | ICD-10-CM | POA: Diagnosis present

## 2018-06-01 DIAGNOSIS — Z8249 Family history of ischemic heart disease and other diseases of the circulatory system: Secondary | ICD-10-CM | POA: Diagnosis not present

## 2018-06-01 DIAGNOSIS — N184 Chronic kidney disease, stage 4 (severe): Secondary | ICD-10-CM | POA: Diagnosis present

## 2018-06-01 LAB — BASIC METABOLIC PANEL
Anion gap: 6 (ref 5–15)
BUN: 54 mg/dL — ABNORMAL HIGH (ref 6–20)
CALCIUM: 8.4 mg/dL — AB (ref 8.9–10.3)
CO2: 15 mmol/L — ABNORMAL LOW (ref 22–32)
CREATININE: 3.85 mg/dL — AB (ref 0.61–1.24)
Chloride: 112 mmol/L — ABNORMAL HIGH (ref 98–111)
GFR calc Af Amer: 19 mL/min — ABNORMAL LOW (ref >60)
GFR calc non Af Amer: 17 mL/min — ABNORMAL LOW (ref >60)
Glucose, Bld: 149 mg/dL — ABNORMAL HIGH (ref 70–99)
Potassium: 4.1 mmol/L (ref 3.5–5.1)
Sodium: 133 mmol/L — ABNORMAL LOW (ref 135–145)

## 2018-06-01 LAB — HEMOGLOBIN AND HEMATOCRIT, BLOOD
HCT: 17 % — ABNORMAL LOW (ref 39.0–52.0)
Hemoglobin: 5.8 g/dL — CL (ref 13.0–17.0)

## 2018-06-01 LAB — GLUCOSE, CAPILLARY
Glucose-Capillary: 116 mg/dL — ABNORMAL HIGH (ref 70–99)
Glucose-Capillary: 171 mg/dL — ABNORMAL HIGH (ref 70–99)
Glucose-Capillary: 168 mg/dL — ABNORMAL HIGH (ref 70–99)
Glucose-Capillary: 188 mg/dL — ABNORMAL HIGH (ref 70–99)

## 2018-06-01 LAB — PREPARE RBC (CROSSMATCH)

## 2018-06-01 LAB — HIV ANTIBODY (ROUTINE TESTING W REFLEX): HIV Screen 4th Generation wRfx: NONREACTIVE

## 2018-06-01 LAB — T4, FREE: FREE T4: 0.71 ng/dL — AB (ref 0.82–1.77)

## 2018-06-01 MED ORDER — MIDAZOLAM HCL 2 MG/2ML IJ SOLN
INTRAMUSCULAR | Status: AC | PRN
  Administered 2018-06-01: 1 mg via INTRAVENOUS
  Administered 2018-06-01: 0.5 mg via INTRAVENOUS

## 2018-06-01 MED ORDER — SODIUM CHLORIDE 0.9 % IV SOLN
INTRAVENOUS | Status: AC | PRN
  Administered 2018-06-01: 10 mL/h via INTRAVENOUS

## 2018-06-01 MED ORDER — SODIUM CHLORIDE 0.9% IV SOLUTION: Freq: Once | INTRAVENOUS | Status: DC

## 2018-06-01 MED ORDER — FENTANYL CITRATE (PF) 100 MCG/2ML IJ SOLN
INTRAMUSCULAR | Status: AC | PRN
  Administered 2018-06-01: 50 ug via INTRAVENOUS
  Administered 2018-06-01: 25 ug via INTRAVENOUS

## 2018-06-01 MED ORDER — LIDOCAINE HCL 1 % IJ SOLN
INTRAMUSCULAR | Status: AC
  Filled 2018-06-01: qty 20

## 2018-06-01 MED ORDER — FENTANYL CITRATE (PF) 100 MCG/2ML IJ SOLN
INTRAMUSCULAR | Status: AC
  Filled 2018-06-01: qty 4

## 2018-06-01 MED ORDER — MIDAZOLAM HCL 2 MG/2ML IJ SOLN
INTRAMUSCULAR | Status: AC
  Filled 2018-06-01: qty 4

## 2018-06-01 NOTE — Progress Notes (Signed)
HEMATOLOGY-ONCOLOGY PROGRESS NOTE  SUBJECTIVE: Patient is admitted with severe anemia and received 4 units of blood transfusion Bone marrow biopsy was performed today. 53 year old with prior history of renal transplant on immunosuppressive medication.  And chronic kidney disease with hypertension and diabetes admitted with recurrent severe anemia requiring significant transfusions.  He feels better after the blood transfusion.  OBJECTIVE: REVIEW OF SYSTEMS:   Constitutional: Denies fevers, chills or abnormal weight loss Eyes: Pallor Ears, nose, mouth, throat, and face: Denies mucositis or sore throat Respiratory: Shortness of breath to exertion Cardiovascular: Denies palpitation, chest discomfort Gastrointestinal:  Denies nausea, heartburn or change in bowel habits Skin: Denies abnormal skin rashes Lymphatics: Denies new lymphadenopathy or easy bruising Neurological:Denies numbness, tingling or new weaknesses Behavioral/Psych: Mood is stable, no new changes  Extremities: No lower extremity edema All other systems were reviewed with the patient and are negative.  I have reviewed the past medical history, past surgical history, social history and family history with the patient and they are unchanged from previous note.   PHYSICAL EXAMINATION: ECOG PERFORMANCE STATUS: 3 - Symptomatic, >50% confined to bed  Vitals:   06/01/18 1056 06/01/18 1140  BP: 110/64 111/72  Pulse: 71 68  Resp: 14 15  Temp:    SpO2: 100% 100%   Filed Weights   05/30/18 1835 05/31/18 2120  Weight: 160 lb (72.6 kg) 141 lb 12.1 oz (64.3 kg)    GENERAL:alert, no distress and comfortable SKIN: skin color, texture, turgor are normal, no rashes or significant lesions EYES: normal, Conjunctiva are pink and non-injected, sclera clear OROPHARYNX:no exudate, no erythema and lips, buccal mucosa, and tongue normal  NECK: supple, thyroid normal size, non-tender, without nodularity LYMPH:  no palpable  lymphadenopathy in the cervical, axillary or inguinal LUNGS: clear to auscultation and percussion with normal breathing effort HEART: regular rate & rhythm and no murmurs and no lower extremity edema ABDOMEN:abdomen soft, non-tender and normal bowel sounds Musculoskeletal:no cyanosis of digits and no clubbing  NEURO: alert & oriented x 3 with fluent speech, no focal motor/sensory deficits  LABORATORY DATA:  I have reviewed the data as listed CMP Latest Ref Rng & Units 06/01/2018 05/31/2018 05/30/2018  Glucose 70 - 99 mg/dL 149(H) 154(H) 174(H)  BUN 6 - 20 mg/dL 54(H) 61(H) 60(H)  Creatinine 0.61 - 1.24 mg/dL 3.85(H) 4.70(H) 4.80(H)  Sodium 135 - 145 mmol/L 133(L) 133(L) 133(L)  Potassium 3.5 - 5.1 mmol/L 4.1 4.2 4.5  Chloride 98 - 111 mmol/L 112(H) 110 107  CO2 22 - 32 mmol/L 15(L) 13(L) 12(L)  Calcium 8.9 - 10.3 mg/dL 8.4(L) 8.4(L) 8.8(L)  Total Protein 6.5 - 8.1 g/dL - - -  Total Bilirubin 0.3 - 1.2 mg/dL - - -  Alkaline Phos 38 - 126 U/L - - -  AST 15 - 41 U/L - - -  ALT 0 - 44 U/L - - -    Lab Results  Component Value Date   WBC 3.7 (L) 05/31/2018   HGB 5.8 (LL) 06/01/2018   HCT 17.0 (L) 06/01/2018   MCV 86.4 05/31/2018   PLT 101 (L) 05/31/2018   NEUTROABS 2.2 05/03/2018    ASSESSMENT AND PLAN:  Severe normocytic anemia: Previous extensive work-up showed hypo-proliferation of the bone marrow.  Bone marrow biopsy was performed today to look for post transplant lymphoproliferative disorders or myelodysplastic syndrome. The results may not be available to next week. I would like to see him in my office to go over the results and determine the treatment plan. In  the meantime continue with blood transfusions to keep his hemoglobin more than 7. I will make an appointment for him to see me next Thursday In my office

## 2018-06-01 NOTE — Progress Notes (Signed)
PROGRESS NOTE                                                                                                                                                                                                             Patient Demographics:    Austin Murphy, is a 53 y.o. male, DOB - Sep 07, 1965, PNT:[id number]  Admit date - 05/30/2018   Admitting Physician Rise Patience, MD  Outpatient Primary MD for the patient is Rexene Agent, MD  LOS - 0  Outpatient Specialists: None  Chief Complaint  Patient presents with  . Other    Low Hgb       Brief Narrative   53 year old male with history of renal transplant, diabetes mellitus type 2, hypertension, BPH, chronic kidney disease stage IV who was admitted 1 month back with severe anemia and received 6 unit PRBC.  He was seen by hematologist with plan on outpatient follow-up in about 6-8 weeks (plan plan on bone marrow biopsy if he did not respond to erythropointin). Patient saw his PCP on the day of admission and found to have hemoglobin around 4 and was instructed to come to the ED.  She denies noticing any blood in stool or dark stool, hematemesis or hematuria.  He does report dizziness and some lightheadedness but no chest pain, shortness of breath or abdominal pain. In the ED hemoglobin was 5.  Ordered for 2 unit PRBC and placed on observation.   Subjective:   Still feels tired.  Ordered for another 2 unit PRBC as hemoglobin still 6.3.   Assessment  & Plan :    Principal Problem:   Symptomatic anemia 2 unit PRBC ordered on admission.  Hemoglobin still 6.3 and needs further 2 unit PRBC. Serial H&H.  Avoid NSAIDs.  Appreciate hematology consult.  CT guided bone marrow biopsy done today.  Will follow with results.  Active Problems:   Renal transplant recipient and chronic kidney disease stage IV Renal function at baseline (4-5).  Continue CellCept, prednisone, Prograf  , Sensipar and Lasix.  Check Prograf level.  Hydronephrosis Noted during CT biopsy.  Patient did have mild hydronephrosis from ultrasound 1 year back.  Will repeat renal ultrasound to monitor.  Diarrhea Was noted during last hospitalization.  stool for C. difficile was negative.  Patient informed his PCP of having frequent diarrhea and >  20 pound weight loss in the past few months.  He reports having about 1-2 times of loose bowel movement daily.  Reports almost 30 pound weight loss of unclear duration. Ordered stool for Hemoccult stool culture, stool fecal fat and HIV antibody.       Uncontrolled hypertension Currently stable.  Resume home medications    DM (diabetes mellitus), type 2, uncontrolled, with renal complications (HCC) Stable on sliding scale coverage.  Metabolic acidosis Bicarb of 15.  Will add sodium bicarb supplement.   Inpatient status Patient presenting with severe symptomatic anemia with extremely low hemoglobin requiring multiple blood transfusions.  Still having persistent dizziness and low hemoglobin requiring further transfusions and close monitoring.  Patient will need to stay in the hospital for at least greater than 2 midnights until his symptoms are better and hemoglobin improved with transfusion.   Code Status : Full code  Family Communication  : None at bedside  Disposition Plan  : Home pending improvement in his H&H post transfusion and no further symptoms.  Possibly in the morning.  Barriers For Discharge :   Consults  : IR, hematology  Procedures  : None  DVT Prophylaxis  : SCDs  Lab Results  Component Value Date   PLT 101 (L) 05/31/2018    Antibiotics  :    Anti-infectives (From admission, onward)   None        Objective:   Vitals:   06/01/18 1011 06/01/18 1019 06/01/18 1056 06/01/18 1140  BP: 108/68 112/69 110/64 111/72  Pulse: 71 68 71 68  Resp: 15 16 14 15   Temp:  97.6 F (36.4 C)    TempSrc:  Oral    SpO2: 100% 100% 100%  100%  Weight:      Height:        Wt Readings from Last 3 Encounters:  05/31/18 64.3 kg  05/01/18 67.8 kg  02/28/18 74.7 kg     Intake/Output Summary (Last 24 hours) at 06/01/2018 1340 Last data filed at 06/01/2018 1200 Gross per 24 hour  Intake 720 ml  Output 2100 ml  Net -1380 ml   Physical exam Fatigued, not in distress HEENT: Pallor present, moist mucosa, supple neck Chest: Clear bilaterally CVS: Normal S1-S2, no murmurs GI: Soft, nondistended, nontender Musculoskeletal: Warm, no edema      Data Review:    CBC Recent Labs  Lab 05/30/18 1846 05/31/18 0423 05/31/18 1437 05/31/18 2240 06/01/18 0743  WBC 4.3 3.7*  --   --   --   HGB 5.0* 6.3* 6.0* 6.3* 5.8*  HCT 15.0* 18.4* 17.9* 18.1* 17.0*  PLT 143* 101*  --   --   --   MCV 85.2 86.4  --   --   --   MCH 28.4 29.6  --   --   --   MCHC 33.3 34.2  --   --   --   RDW 14.3 14.2  --   --   --     Chemistries  Recent Labs  Lab 05/30/18 1846 05/31/18 0423 06/01/18 0743  NA 133* 133* 133*  K 4.5 4.2 4.1  CL 107 110 112*  CO2 12* 13* 15*  GLUCOSE 174* 154* 149*  BUN 60* 61* 54*  CREATININE 4.80* 4.70* 3.85*  CALCIUM 8.8* 8.4* 8.4*   ------------------------------------------------------------------------------------------------------------------ No results for input(s): CHOL, HDL, LDLCALC, TRIG, CHOLHDL, LDLDIRECT in the last 72 hours.  Lab Results  Component Value Date   HGBA1C 6.2 (H) 12/30/2017   ------------------------------------------------------------------------------------------------------------------ Recent  Labs    05/31/18 1437  TSH 0.326*   ------------------------------------------------------------------------------------------------------------------ Recent Labs    05/30/18 1846  RETICCTPCT <0.4*    Coagulation profile No results for input(s): INR, PROTIME in the last 168 hours.  No results for input(s): DDIMER in the last 72 hours.  Cardiac Enzymes No results for  input(s): CKMB, TROPONINI, MYOGLOBIN in the last 168 hours.  Invalid input(s): CK ------------------------------------------------------------------------------------------------------------------ No results found for: BNP  Inpatient Medications  Scheduled Meds: . sodium chloride   Intravenous Once  . calcitRIOL  0.5 mcg Oral BID  . calcium carbonate  1 tablet Oral QAC supper  . cinacalcet  30 mg Oral QAC supper  . doxazosin  8 mg Oral QPM  . famotidine  20 mg Oral QPM  . fentaNYL      . finasteride  5 mg Oral Daily  . furosemide  80 mg Oral Daily  . insulin aspart  0-9 Units Subcutaneous TID WC  . lidocaine      . midazolam      . mycophenolate  1,000 mg Oral BID  . potassium chloride SA  20 mEq Oral BID  . predniSONE  5 mg Oral Q breakfast  . tacrolimus  8 mg Oral BID   Continuous Infusions: PRN Meds:.acetaminophen **OR** acetaminophen, ondansetron **OR** ondansetron (ZOFRAN) IV  Micro Results No results found for this or any previous visit (from the past 240 hour(s)).  Radiology Reports Ct Bone Marrow Biopsy  Result Date: 06/01/2018 INDICATION: 53 year old with symptomatic anemia.  History of renal transplant. EXAM: CT GUIDED BONE MARROW ASPIRATES AND BIOPSY Physician: Stephan Minister. Anselm Pancoast, MD MEDICATIONS: None. ANESTHESIA/SEDATION: Fentanyl 75 mcg IV; Versed 1.5 mg IV Moderate Sedation Time:  20 minutes The patient was continuously monitored during the procedure by the interventional radiology nurse under my direct supervision. COMPLICATIONS: None immediate. PROCEDURE: The procedure was explained to the patient. The risks and benefits of the procedure were discussed and the patient's questions were addressed. Informed consent was obtained from the patient. The patient was placed prone on CT table. Images of the pelvis were obtained. The right side of back was prepped and draped in sterile fashion. The skin and right posterior ilium were anesthetized with 1% lidocaine. 11 gauge bone  needle was directed into the right ilium with CT guidance. Two aspirates and two core biopsies were obtained. Bandage placed over the puncture site. FINDINGS: Needle was directed into the posterior right ilium. Adequate specimens obtained. 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 There was mild hydronephrosis on the ultrasound from 07/05/2017. IMPRESSION: CT guided bone marrow aspiration and core biopsy. Renal transplant with hydronephrosis. Difficult to compare the degree of hydronephrosis compared to the previous renal ultrasound from 07/05/2017. This could be further characterized with a dedicated renal ultrasound if needed. These results will be called to the ordering clinician or representative by the Radiologist Assistant, and communication documented in the PACS or zVision Dashboard. Electronically Signed   By: Markus Daft M.D.   On: 06/01/2018 11:03    Time Spent in minutes  25   Demitrus Francisco M.D on 06/01/2018 at 1:40 PM  Between 7am to 7pm - Pager - [phone]  After 7pm go to [email or web address] - password [facility]  [facility] -  Office  [phone]

## 2018-06-01 NOTE — Progress Notes (Signed)
IR nurse notified about blood being ready for pick up and asked if pt  blood transfusion needs to be initiated before the procedure, she said pt can get blood after the procedure.

## 2018-06-01 NOTE — Procedures (Signed)
Interventional Radiology Procedure:   Indications: Anemia    Procedure: CT guided bone marrow biopsy  Findings: 2 aspirates and 2 cores from right ilium   Complications: None     EBL: Minimal  Plan: Bedrest 1 hour.    Austin Murphy R. Anselm Pancoast, MD  Pager: [phone]

## 2018-06-02 DIAGNOSIS — R197 Diarrhea, unspecified: Secondary | ICD-10-CM

## 2018-06-02 DIAGNOSIS — N1330 Unspecified hydronephrosis: Secondary | ICD-10-CM

## 2018-06-02 LAB — TYPE AND SCREEN
ABO/RH(D): O NEG
Antibody Screen: NEGATIVE
DONOR AG TYPE: NEGATIVE
Donor AG Type: NEGATIVE
Donor AG Type: NEGATIVE
Donor AG Type: NEGATIVE
UNIT DIVISION: 0
Unit division: 0
Unit division: 0
Unit division: 0

## 2018-06-02 LAB — BASIC METABOLIC PANEL
Anion gap: 10 (ref 5–15)
BUN: 53 mg/dL — ABNORMAL HIGH (ref 6–20)
CO2: 15 mmol/L — ABNORMAL LOW (ref 22–32)
CREATININE: 3.97 mg/dL — AB (ref 0.61–1.24)
Calcium: 8.5 mg/dL — ABNORMAL LOW (ref 8.9–10.3)
Chloride: 109 mmol/L (ref 98–111)
GFR calc Af Amer: 19 mL/min — ABNORMAL LOW (ref >60)
GFR, EST NON AFRICAN AMERICAN: 16 mL/min — AB (ref >60)
Glucose, Bld: 152 mg/dL — ABNORMAL HIGH (ref 70–99)
Potassium: 3.9 mmol/L (ref 3.5–5.1)
Sodium: 134 mmol/L — ABNORMAL LOW (ref 135–145)

## 2018-06-02 LAB — CBC
HCT: 22.9 % — ABNORMAL LOW (ref 39.0–52.0)
Hemoglobin: 7.7 g/dL — ABNORMAL LOW (ref 13.0–17.0)
MCH: 28.2 pg (ref 26.0–34.0)
MCHC: 33.6 g/dL (ref 30.0–36.0)
MCV: 83.9 fL (ref 80.0–100.0)
PLATELETS: 97 10*3/uL — AB (ref 150–400)
RBC: 2.73 MIL/uL — ABNORMAL LOW (ref 4.22–5.81)
RDW: 14.2 % (ref 11.5–15.5)
WBC: 2.8 10*3/uL — ABNORMAL LOW (ref 4.0–10.5)
nRBC: 0 % (ref 0.0–0.2)

## 2018-06-02 LAB — BPAM RBC
BLOOD PRODUCT EXPIRATION DATE: 202001112359
Blood Product Expiration Date: 202001222359
Blood Product Expiration Date: 202001242359
Blood Product Expiration Date: 202001282359
ISSUE DATE / TIME: 202001072308
ISSUE DATE / TIME: 202001090958
ISSUE DATE / TIME: 202001072030
ISSUE DATE / TIME: 202001091403
Unit Type and Rh: 9500
Unit Type and Rh: 9500
Unit Type and Rh: 9500
Unit Type and Rh: 9500

## 2018-06-02 LAB — TACROLIMUS LEVEL: Tacrolimus (FK506) - LabCorp: 3 ng/mL (ref 2.0–20.0)

## 2018-06-02 LAB — GLUCOSE, CAPILLARY
GLUCOSE-CAPILLARY: 163 mg/dL — AB (ref 70–99)
Glucose-Capillary: 182 mg/dL — ABNORMAL HIGH (ref 70–99)

## 2018-06-02 LAB — T3, FREE: T3, Free: 1.7 pg/mL — ABNORMAL LOW (ref 2.0–4.4)

## 2018-06-02 LAB — FECAL FAT, QUALITATIVE
FAT QUAL NEUTRAL STL: NORMAL
Fat Qual Total, Stl: NORMAL

## 2018-06-02 MED ORDER — LOPERAMIDE HCL 2 MG PO CAPS: 2.0000 mg | ORAL_CAPSULE | ORAL | 0 refills | Status: AC | PRN

## 2018-06-02 MED ORDER — LOPERAMIDE HCL 2 MG PO CAPS
2.0000 mg | ORAL_CAPSULE | ORAL | Status: DC | PRN
  Administered 2018-06-02: 2 mg via ORAL
  Filled 2018-06-02: qty 1

## 2018-06-02 NOTE — Plan of Care (Signed)
  Problem: Education: Goal: Knowledge of General Education information will improve Description Including pain rating scale, medication(s)/side effects and non-pharmacologic comfort measures Outcome: Progressing Note:  POC reviewed with pt.   

## 2018-06-02 NOTE — Discharge Summary (Addendum)
Physician Discharge Summary  Austin Murphy SWH:[id number] DOB: 17-Oct-1965 DOA: 05/30/2018  PCP: Rexene Agent, MD  Admit date: 05/30/2018 Discharge date: 06/02/2018  Admitted From: Home Disposition: Home  Recommendations for Outpatient Follow-up:  1. Follow up with PCP in 1-2 weeks.  Monitor H&H and renal function during outpatient follow-up. 2. Follow-up with hematologist (Dr. [name]) in 2 weeks with bone marrow results.  Home Health: None Equipment/Devices: None  Discharge Condition: Fair CODE STATUS: Full code Diet recommendation: Carb modified/renal  Discharge Diagnoses:  Principal Problem:   Symptomatic anemia  Active Problems:   Renal transplant recipient   Uncontrolled hypertension   DM (diabetes mellitus), type 2, uncontrolled, with renal complications (HCC)   Diarrhea   Hydronephrosis of transplanted kidney Chronic kidney disease stage IV  Brief narrative/HPI 53 year old male with history of renal transplant, diabetes mellitus type 2, hypertension, BPH, chronic kidney disease stage IV who was admitted 1 month back with severe anemia and received 6 unit PRBC.  He was seen by hematologist with plan on outpatient follow-up in about 6-8 weeks (plan plan on bone marrow biopsy if he did not respond to erythropointin). Patient saw his PCP on the day of admission and found to have hemoglobin around 4 and was instructed to come to the ED.  She denies noticing any blood in stool or dark stool, hematemesis or hematuria.  He does report dizziness and some lightheadedness but no chest pain, shortness of breath or abdominal pain. In the ED hemoglobin was 5.  Ordered for 2 unit PRBC and admitted to hospital service.  Hospital course  Principal Problem:   Symptomatic anemia, with probable hypo-proliferative disorder Received total 4 unit PRBC and hemoglobin currently 7.7.  Denies further dizziness. Hematology consult appreciated and patient underwent CT-guided bone marrow biopsy.   Hematologist will follow-up with with bone marrow biopsy results and arrange outpatient follow-up. Monitor H&H as outpatient.   Active Problems:   Renal transplant recipient and chronic kidney disease stage IV Renal function at baseline (3.8-5). Continue CellCept, prednisone, Prograf , Sensipar and Lasix.    Patient to follow-up with his nephrologist within 1-2 weeks.  Hydronephrosis Noted during CT biopsy.  Patient did have mild hydronephrosis from ultrasound 1 year back.    Repeat renal ultrasound does show mild to moderate hydronephrosis.  Discussed with his nephrologist who feels this is common in transplant kidney in patients.  Will follow as outpatient.  Patient did have some post void residual urine and this could be due to enlarged prostate however he is already on finasteride and Cardura so I did not add any Flomax.  Diarrhea Was noted during last hospitalization.  stool for C. difficile was negative.  Patient informed his PCP of having frequent diarrhea and >20 pound weight loss in the past few months.  He reports having once a day of watery bowel movement, occasionally twice.  Of loose bowel movement daily.    Follow stool culture and fecal fat results as outpatient [facility] this admission).     Uncontrolled hypertension Has been well controlled on his home meds.  Continue.    DM (diabetes mellitus), type 2, uncontrolled, with renal complications (HCC) [id number] 6 months back of 5.8.  Will discontinue metformin (listed on his home meds) given his advanced CKD and should not be used further.  Metabolic acidosis Bicarb of 15 and stable.  Metformin discontinued.  Follow labs as outpatient.       Family Communication  : None at bedside  Disposition Plan  :  Home  Consults  : IR, hematology  Procedures  : CT-guided bone marrow biopsy, renal ultrasound   Discharge Instructions   Allergies as of 06/02/2018   No Known Allergies     Medication List    STOP taking  these medications   metFORMIN 500 MG tablet Commonly known as:  GLUCOPHAGE     TAKE these medications   acetaminophen 500 MG tablet Commonly known as:  TYLENOL Take 1,000-1,500 mg by mouth 2 (two) times daily as needed for moderate pain.   calcitRIOL 0.5 MCG capsule Commonly known as:  ROCALTROL Take 0.5 mcg by mouth 2 (two) times daily.   calcium carbonate 500 MG chewable tablet Commonly known as:  TUMS - dosed in mg elemental calcium Chew 1 tablet by mouth daily.   cinacalcet 30 MG tablet Commonly known as:  SENSIPAR Take 30 mg by mouth daily. With the largest meal of the day   colchicine 0.6 MG tablet Take 0.6-1.2 mg by mouth daily as needed (for gout flare-ups).   doxazosin 8 MG tablet Commonly known as:  CARDURA Take 8 mg by mouth every evening.   famotidine 20 MG tablet Commonly known as:  PEPCID Take 20 mg by mouth every evening.   finasteride 5 MG tablet Commonly known as:  PROSCAR Take 5 mg by mouth daily.   furosemide 80 MG tablet Commonly known as:  LASIX Take 80 mg by mouth daily.   loperamide 2 MG capsule Commonly known as:  IMODIUM Take 1 capsule (2 mg total) by mouth as needed for diarrhea or loose stools.   mycophenolate 250 MG capsule Commonly known as:  CELLCEPT Take 1,000 mg by mouth 2 (two) times daily.   potassium chloride SA 20 MEQ tablet Commonly known as:  K-DUR,KLOR-CON Take 20 mEq by mouth 2 (two) times daily.   predniSONE 5 MG tablet Commonly known as:  DELTASONE Take 1 tablet (5 mg total) by mouth daily with breakfast.   tacrolimus 1 MG capsule Commonly known as:  PROGRAF Take 3 mg by mouth 2 (two) times daily.   tacrolimus 5 MG capsule Commonly known as:  PROGRAF Take 5 mg by mouth 2 (two) times daily.      Follow-up Information    [name], MD. Schedule an appointment as soon as possible for a visit in 2 week(s).   Specialty:  Hematology and Oncology Contact information: [place]  [address] [phone]        Rexene Agent, MD. Schedule an appointment as soon as possible for a visit in 1 week(s).   Specialty:  Nephrology Contact information: [place] [id number] [phone]          No Known Allergies    Procedures/Studies: US Renal  Result Date: 06/01/2018 CLINICAL DATA:  Hydronephrosis of transplanted kidneys noted on procedural CT. History of 2 renal transplants. EXAM: RENAL / URINARY TRACT ULTRASOUND COMPLETE COMPARISON:  CT 06/01/2018.  Ultrasound renal transplant 07/05/2017 FINDINGS: Native right Kidney: Renal measurements: 2.9 x 5.5 x 2.5 cm = volume: 22 mL. The native right kidney is diffusely atrophic with increased parenchymal echotexture consistent with chronic medical renal disease. No hydronephrosis or solid mass identified. A cyst is demonstrated in the lower pole measuring 2.6 cm maximal diameter. Benign appearance. Less than 10 cysts are present. Native left Kidney: The native left kidney was not visualized. Images of the left renal fossa demonstrate no mass, cyst, or other focal abnormality. Transplant right Kidney: Renal measurements:  7.7 x 5.3 x 4.9 cm = volume: 107 mL. The right transplant kidney is located in the right pelvis. Normal parenchymal echotexture and thickness. There is evidence of mild to moderate hydronephrosis with dilatation of the intrarenal collecting system. No mass lesions are identified. Doppler evaluation was not performed. Transplant left Kidney: Renal measurements: 9.4 x 6.1 x 5.9 cm = volume: 179 mL. The left transplant kidney is located in the left pelvis. Normal parenchymal echotexture and thickness. There is evidence of mild to moderate hydronephrosis with dilatation of the intrarenal collecting system. No mass lesions are identified. Doppler evaluation was not performed. Bladder: There is no bladder wall thickening, stone, or filling defect identified. Prevoid bladder volume is calculated at 586 mL.  There is a large postvoid residual of 184 mL. Patient voided 450 ml. IMPRESSION: 1. Bilateral transplant kidneys with mild to moderate hydronephrosis in both transplanted kidneys. 2. Severe atrophy of the native right kidney. Native left kidney is not visualized. 3. Large postvoid residual in the bladder. Electronically Signed   By: Lucienne Capers M.D.   On: 06/01/2018 19:43   Ct Bone Marrow Biopsy  Result Date: 06/01/2018 INDICATION: 53 year old with symptomatic anemia.  History of renal transplant. EXAM: CT GUIDED BONE MARROW ASPIRATES AND BIOPSY Physician: Stephan Minister. Anselm Pancoast, MD MEDICATIONS: None. ANESTHESIA/SEDATION: Fentanyl 75 mcg IV; Versed 1.5 mg IV Moderate Sedation Time:  20 minutes The patient was continuously monitored during the procedure by the interventional radiology nurse under my direct supervision. COMPLICATIONS: None immediate. PROCEDURE: The procedure was explained to the patient. The risks and benefits of the procedure were discussed and the patient's questions were addressed. Informed consent was obtained from the patient. The patient was placed prone on CT table. Images of the pelvis were obtained. The right side of back was prepped and draped in sterile fashion. The skin and right posterior ilium were anesthetized with 1% lidocaine. 11 gauge bone needle was directed into the right ilium with CT guidance. Two aspirates and two core biopsies were obtained. Bandage placed over the puncture site. FINDINGS: Needle was directed into the posterior right ilium. Adequate specimens obtained. 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 There was mild hydronephrosis on the ultrasound from 07/05/2017. IMPRESSION: CT guided bone marrow aspiration and core biopsy. Renal transplant with hydronephrosis. Difficult to compare the degree of hydronephrosis compared to the previous renal ultrasound from 07/05/2017. This could be further  characterized with a dedicated renal ultrasound if needed. These results will be called to the ordering clinician or representative by the Radiologist Assistant, and communication documented in the PACS or zVision Dashboard. Electronically Signed   By: Markus Daft M.D.   On: 06/01/2018 11:03    (Echo, Carotid, EGD, Colonoscopy, ERCP)    Subjective:   Discharge Exam: Vitals:   06/02/18 0506 06/02/18 0907  BP: 104/69 111/70  Pulse: 73 66  Resp: 18 18  Temp: 98.2 F (36.8 C) 98.2 F (36.8 C)  SpO2: 100% 100%   Vitals:   06/01/18 1634 06/01/18 2116 06/02/18 0506 06/02/18 0907  BP: 119/78 100/62 104/69 111/70  Pulse: 66 72 73 66  Resp: 17 18 18 18   Temp: 98.5 F (36.9 C) 98 F (36.7 C) 98.2 F (36.8 C) 98.2 F (36.8 C)  TempSrc: Oral Oral  Oral  SpO2: 100% 100% 100% 100%  Weight:  64.2 kg    Height:       General: Middle-aged male not  in distress HEENT: Pallor present, no icterus, moist mucosa, supple neck Chest: Clear bilaterally CVs: Normal S1-S2, no murmurs GI: Soft, nondistended nontender, bowel sounds present Musculoskeletal: Warm, no edema    The results of significant diagnostics from this hospitalization (including imaging, microbiology, ancillary and laboratory) are listed below for reference.     Microbiology: No results found for this or any previous visit (from the past 240 hour(s)).   Labs: BNP (last 3 results) No results for input(s): BNP in the last 8760 hours. Basic Metabolic Panel: Recent Labs  Lab 05/30/18 1846 05/31/18 0423 06/01/18 0743 06/02/18 0758  NA 133* 133* 133* 134*  K 4.5 4.2 4.1 3.9  CL 107 110 112* 109  CO2 12* 13* 15* 15*  GLUCOSE 174* 154* 149* 152*  BUN 60* 61* 54* 53*  CREATININE 4.80* 4.70* 3.85* 3.97*  CALCIUM 8.8* 8.4* 8.4* 8.5*   Liver Function Tests: No results for input(s): AST, ALT, ALKPHOS, BILITOT, PROT, ALBUMIN in the last 168 hours. No results for input(s): LIPASE, AMYLASE in the last 168 hours. No results  for input(s): AMMONIA in the last 168 hours. CBC: Recent Labs  Lab 05/30/18 1846 05/31/18 0423 05/31/18 1437 05/31/18 2240 06/01/18 0743 06/02/18 0758  WBC 4.3 3.7*  --   --   --  2.8*  HGB 5.0* 6.3* 6.0* 6.3* 5.8* 7.7*  HCT 15.0* 18.4* 17.9* 18.1* 17.0* 22.9*  MCV 85.2 86.4  --   --   --  83.9  PLT 143* 101*  --   --   --  97*   Cardiac Enzymes: No results for input(s): CKTOTAL, CKMB, CKMBINDEX, TROPONINI in the last 168 hours. BNP: Invalid input(s): POCBNP CBG: Recent Labs  Lab 06/01/18 1110 06/01/18 1630 06/01/18 2115 06/02/18 0741 06/02/18 1112  GLUCAP 171* 168* 188* 182* 163*   D-Dimer No results for input(s): DDIMER in the last 72 hours. Hgb A1c No results for input(s): HGBA1C in the last 72 hours. Lipid Profile No results for input(s): CHOL, HDL, LDLCALC, TRIG, CHOLHDL, LDLDIRECT in the last 72 hours. Thyroid function studies Recent Labs    05/31/18 1437 06/01/18 0743  TSH 0.326*  --   T3FREE  --  1.7*   Anemia work up Recent Labs    05/30/18 1846  RETICCTPCT <0.4*   Urinalysis    Component Value Date/Time   COLORURINE YELLOW 04/30/2018 2055   APPEARANCEUR CLEAR 04/30/2018 2055   LABSPEC 1.011 04/30/2018 2055   PHURINE 5.0 04/30/2018 2055   [place] 04/30/2018 2055   HGBUR NEGATIVE 04/30/2018 2055   [place] NEGATIVE 04/30/2018 2055   [place] NEGATIVE 04/30/2018 2055   PROTEINUR NEGATIVE 04/30/2018 2055   UROBILINOGEN 0.2 07/09/2014 0932   NITRITE NEGATIVE 04/30/2018 2055   LEUKOCYTESUR NEGATIVE 04/30/2018 2055   Sepsis Labs Invalid input(s): PROCALCITONIN,  WBC,  LACTICIDVEN Microbiology No results found for this or any previous visit (from the past 240 hour(s)).   Time coordinating discharge: 35 minutes  SIGNED:   Louellen Molder, MD  [facility] 06/02/2018, 11:55 AM Pager   If 7PM-7AM, please contact night-coverage [email or web address] Password [email or web address]

## 2018-06-02 NOTE — Progress Notes (Signed)
Micro lab called and stated that when the stool sample was collected yesterday, the lab failed to place it in the appropriate container. MD notified. Dhungel, MD stated that patient had a D/C order and it was  not necessary to keep the patient here in order to re-collect.

## 2018-06-02 NOTE — Progress Notes (Signed)
Austin Murphy to be D/C'd Home per MD order.  Discussed prescriptions and follow up appointments with the patient. Prescriptions given to patient, medication list explained in detail. Pt verbalized understanding.  Allergies as of 06/02/2018   No Known Allergies     Medication List    STOP taking these medications   metFORMIN 500 MG tablet Commonly known as:  GLUCOPHAGE     TAKE these medications   acetaminophen 500 MG tablet Commonly known as:  TYLENOL Take 1,000-1,500 mg by mouth 2 (two) times daily as needed for moderate pain.   calcitRIOL 0.5 MCG capsule Commonly known as:  ROCALTROL Take 0.5 mcg by mouth 2 (two) times daily.   calcium carbonate 500 MG chewable tablet Commonly known as:  TUMS - dosed in mg elemental calcium Chew 1 tablet by mouth daily.   cinacalcet 30 MG tablet Commonly known as:  SENSIPAR Take 30 mg by mouth daily. With the largest meal of the day   colchicine 0.6 MG tablet Take 0.6-1.2 mg by mouth daily as needed (for gout flare-ups).   doxazosin 8 MG tablet Commonly known as:  CARDURA Take 8 mg by mouth every evening.   famotidine 20 MG tablet Commonly known as:  PEPCID Take 20 mg by mouth every evening.   finasteride 5 MG tablet Commonly known as:  PROSCAR Take 5 mg by mouth daily.   furosemide 80 MG tablet Commonly known as:  LASIX Take 80 mg by mouth daily.   loperamide 2 MG capsule Commonly known as:  IMODIUM Take 1 capsule (2 mg total) by mouth as needed for diarrhea or loose stools.   mycophenolate 250 MG capsule Commonly known as:  CELLCEPT Take 1,000 mg by mouth 2 (two) times daily.   potassium chloride SA 20 MEQ tablet Commonly known as:  K-DUR,KLOR-CON Take 20 mEq by mouth 2 (two) times daily.   predniSONE 5 MG tablet Commonly known as:  DELTASONE Take 1 tablet (5 mg total) by mouth daily with breakfast.   tacrolimus 1 MG capsule Commonly known as:  PROGRAF Take 3 mg by mouth 2 (two) times daily.   tacrolimus 5 MG  capsule Commonly known as:  PROGRAF Take 5 mg by mouth 2 (two) times daily.       Vitals:   06/02/18 0506 06/02/18 0907  BP: 104/69 111/70  Pulse: 73 66  Resp: 18 18  Temp: 98.2 F (36.8 C) 98.2 F (36.8 C)  SpO2: 100% 100%    Skin clean, dry and intact without evidence of skin break down, no evidence of skin tears noted. IV catheter discontinued intact. Site without signs and symptoms of complications. Dressing and pressure applied. Pt denies pain at this time. No complaints noted.  An After Visit Summary was printed and given to the patient. Patient escorted via [place], and D/C home via private auto.  Aneta Mins BSN, RN

## 2018-06-02 NOTE — Discharge Instructions (Signed)
Anemia  Anemia is a condition in which you do not have enough red blood cells or hemoglobin. Hemoglobin is a substance in red blood cells that carries oxygen. When you do not have enough red blood cells or hemoglobin (are anemic), your body cannot get enough oxygen and your organs may not work properly. As a result, you may feel very tired or have other problems. What are the causes? Common causes of anemia include:  Excessive bleeding. Anemia can be caused by excessive bleeding inside or outside the body, including bleeding from the intestine or from periods in women.  Poor nutrition.  Long-lasting (chronic) kidney, thyroid, and liver disease.  Bone marrow disorders.  Cancer and treatments for cancer.  HIV (human immunodeficiency virus) and AIDS (acquired immunodeficiency syndrome).  Treatments for HIV and AIDS.  Spleen problems.  Blood disorders.  Infections, medicines, and autoimmune disorders that destroy red blood cells. What are the signs or symptoms? Symptoms of this condition include:  Minor weakness.  Dizziness.  Headache.  Feeling heartbeats that are irregular or faster than normal (palpitations).  Shortness of breath, especially with exercise.  Paleness.  Cold sensitivity.  Indigestion.  Nausea.  Difficulty sleeping.  Difficulty concentrating. Symptoms may occur suddenly or develop slowly. If your anemia is mild, you may not have symptoms. How is this diagnosed? This condition is diagnosed based on:  Blood tests.  Your medical history.  A physical exam.  Bone marrow biopsy. Your health care provider may also check your stool (feces) for blood and may do additional testing to look for the cause of your bleeding. You may also have other tests, including:  Imaging tests, such as a CT scan or MRI.  Endoscopy.  Colonoscopy. How is this treated? Treatment for this condition depends on the cause. If you continue to lose a lot of blood, you may  need to be treated at a hospital. Treatment may include:  Taking supplements of iron, vitamin M08, or folic acid.  Taking a hormone medicine (erythropoietin) that can help to stimulate red blood cell growth.  Having a blood transfusion. This may be needed if you lose a lot of blood.  Making changes to your diet.  Having surgery to remove your spleen. Follow these instructions at home:  Take over-the-counter and prescription medicines only as told by your health care provider.  Take supplements only as told by your health care provider.  Follow any diet instructions that you were given.  Keep all follow-up visits as told by your health care provider. This is important. Contact a health care provider if:  You develop new bleeding anywhere in the body. Get help right away if:  You are very weak.  You are short of breath.  You have pain in your abdomen or chest.  You are dizzy or feel faint.  You have trouble concentrating.  You have bloody or black, tarry stools.  You vomit repeatedly or you vomit up blood. Summary  Anemia is a condition in which you do not have enough red blood cells or enough of a substance in your red blood cells that carries oxygen (hemoglobin).  Symptoms may occur suddenly or develop slowly.  If your anemia is mild, you may not have symptoms.  This condition is diagnosed with blood tests as well as a medical history and physical exam. Other tests may be needed.  Treatment for this condition depends on the cause of the anemia. This information is not intended to replace advice given to you by  your health care provider. Make sure you discuss any questions you have with your health care provider. °Document Released: 06/17/2004 Document Revised: 06/11/2016 Document Reviewed: 06/11/2016 °Elsevier Interactive Patient Education © 2019 [employer] ° °

## 2018-06-08 ENCOUNTER — Telehealth: Payer: Self-pay | Admitting: Hematology and Oncology

## 2018-06-08 ENCOUNTER — Telehealth: Payer: Self-pay

## 2018-06-08 ENCOUNTER — Other Ambulatory Visit: Payer: Self-pay

## 2018-06-08 NOTE — Telephone Encounter (Signed)
Pt aware of appt on 1/20

## 2018-06-08 NOTE — Telephone Encounter (Signed)
Called and spoke with pt to let him know that Dr.Gudena would like to see him next Monday for an appt. Pt unable to get transportation. Offered to have pt speak with our transport coordinators and set up transport to and from the cancer center. Pt agreeable and will come in on Monday for appt.  Notified transport coordinator to get pt set up for pick up and drop off on Monday.

## 2018-06-09 ENCOUNTER — Telehealth: Payer: Self-pay | Admitting: Hematology

## 2018-06-09 ENCOUNTER — Telehealth: Payer: Self-pay | Admitting: Hematology and Oncology

## 2018-06-09 ENCOUNTER — Ambulatory Visit: Payer: Medicare Other | Admitting: Hematology and Oncology

## 2018-06-09 NOTE — Telephone Encounter (Signed)
Called patient per 1/17 sch message - unable to reach patient - left message with new appt date and time

## 2018-06-09 NOTE — Telephone Encounter (Signed)
I reviewed the bone marrow biopsy report which showed 70% plasma cells. I discussed the case briefly with Dr. Irene Limbo who is willing to see the patient. Because of transportation issues we will try to keep him at the same appointment time if possible. However instead of seeing me he will see Dr.Kale.

## 2018-06-12 ENCOUNTER — Other Ambulatory Visit: Payer: Medicare Other

## 2018-06-12 ENCOUNTER — Ambulatory Visit: Payer: Medicare Other | Admitting: Hematology and Oncology

## 2018-06-12 ENCOUNTER — Encounter (HOSPITAL_COMMUNITY): Payer: Self-pay | Admitting: Hematology and Oncology

## 2018-06-16 ENCOUNTER — Telehealth: Payer: Self-pay | Admitting: *Deleted

## 2018-06-16 ENCOUNTER — Telehealth: Payer: Self-pay | Admitting: Hematology and Oncology

## 2018-06-16 ENCOUNTER — Inpatient Hospital Stay: Payer: Medicare Other | Attending: Nephrology

## 2018-06-16 ENCOUNTER — Inpatient Hospital Stay: Payer: Medicare Other | Admitting: Hematology

## 2018-06-16 ENCOUNTER — Other Ambulatory Visit: Payer: Self-pay

## 2018-06-16 ENCOUNTER — Inpatient Hospital Stay (HOSPITAL_BASED_OUTPATIENT_CLINIC_OR_DEPARTMENT_OTHER): Payer: Medicare Other | Admitting: Hematology and Oncology

## 2018-06-16 VITALS — BP 106/74 | HR 53 | Temp 97.7°F | Resp 17 | Ht 66.0 in | Wt 140.0 lb

## 2018-06-16 DIAGNOSIS — R000 Tachycardia, unspecified: Secondary | ICD-10-CM | POA: Insufficient documentation

## 2018-06-16 DIAGNOSIS — D649 Anemia, unspecified: Secondary | ICD-10-CM

## 2018-06-16 DIAGNOSIS — D631 Anemia in chronic kidney disease: Secondary | ICD-10-CM

## 2018-06-16 DIAGNOSIS — I959 Hypotension, unspecified: Secondary | ICD-10-CM | POA: Diagnosis not present

## 2018-06-16 DIAGNOSIS — N184 Chronic kidney disease, stage 4 (severe): Secondary | ICD-10-CM

## 2018-06-16 DIAGNOSIS — R5383 Other fatigue: Secondary | ICD-10-CM | POA: Diagnosis not present

## 2018-06-16 DIAGNOSIS — Z940 Kidney transplant status: Secondary | ICD-10-CM

## 2018-06-16 LAB — CBC WITH DIFFERENTIAL/PLATELET
Abs Immature Granulocytes: 0.05 10*3/uL (ref 0.00–0.07)
Basophils Absolute: 0 10*3/uL (ref 0.0–0.1)
Basophils Relative: 1 %
Eosinophils Absolute: 0.1 10*3/uL (ref 0.0–0.5)
Eosinophils Relative: 1 %
HCT: 18.5 % — ABNORMAL LOW (ref 39.0–52.0)
Hemoglobin: 6.6 g/dL — CL (ref 13.0–17.0)
Immature Granulocytes: 1 %
LYMPHS PCT: 32 %
Lymphs Abs: 1.4 10*3/uL (ref 0.7–4.0)
MCH: 28.9 pg (ref 26.0–34.0)
MCHC: 35.7 g/dL (ref 30.0–36.0)
MCV: 81.1 fL (ref 80.0–100.0)
Monocytes Absolute: 0.4 10*3/uL (ref 0.1–1.0)
Monocytes Relative: 10 %
Neutro Abs: 2.4 10*3/uL (ref 1.7–7.7)
Neutrophils Relative %: 55 %
Platelets: 137 10*3/uL — ABNORMAL LOW (ref 150–400)
RBC: 2.28 MIL/uL — ABNORMAL LOW (ref 4.22–5.81)
RDW: 13.7 % (ref 11.5–15.5)
WBC: 4.3 10*3/uL (ref 4.0–10.5)
nRBC: 0 % (ref 0.0–0.2)

## 2018-06-16 LAB — COMPREHENSIVE METABOLIC PANEL
ALT: 9 U/L (ref 0–44)
AST: 8 U/L — ABNORMAL LOW (ref 15–41)
Albumin: 3.7 g/dL (ref 3.5–5.0)
Alkaline Phosphatase: 70 U/L (ref 38–126)
Anion gap: 11 (ref 5–15)
BUN: 63 mg/dL — ABNORMAL HIGH (ref 6–20)
CO2: 14 mmol/L — ABNORMAL LOW (ref 22–32)
Calcium: 8.7 mg/dL — ABNORMAL LOW (ref 8.9–10.3)
Chloride: 107 mmol/L (ref 98–111)
Creatinine, Ser: 4.95 mg/dL (ref 0.61–1.24)
GFR calc Af Amer: 14 mL/min — ABNORMAL LOW (ref >60)
GFR calc non Af Amer: 12 mL/min — ABNORMAL LOW (ref >60)
Glucose, Bld: 124 mg/dL — ABNORMAL HIGH (ref 70–99)
Potassium: 4.4 mmol/L (ref 3.5–5.1)
Sodium: 132 mmol/L — ABNORMAL LOW (ref 135–145)
TOTAL PROTEIN: 6.5 g/dL (ref 6.5–8.1)
Total Bilirubin: 0.3 mg/dL (ref 0.3–1.2)

## 2018-06-16 LAB — PREPARE RBC (CROSSMATCH)

## 2018-06-16 NOTE — Progress Notes (Signed)
Patient Care Team: Rexene Agent, MD as PCP - General (Nephrology)  DIAGNOSIS:  Encounter Diagnosis  Name Primary?  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  He underwent a bone marrow biopsy during the recent hospitalization and he is here today for to follow-up on the results.  He reports to be feeling fatigued and tired and is wheelchair-bound.  His blood pressure is slightly reduced today.  Denies any palpitations but he does report shortness of breath to minimal exertion.  REVIEW OF SYSTEMS:   Constitutional: Denies fevers, chills or abnormal weight loss Eyes: Denies blurriness of vision Ears, nose, mouth, throat, and face: Denies mucositis or sore throat Respiratory: Denies cough, dyspnea or wheezes Cardiovascular: Denies palpitation, chest discomfort Gastrointestinal:  Denies nausea, heartburn or change in bowel habits Skin: Denies abnormal skin rashes Lymphatics: Denies new lymphadenopathy or easy bruising Neurological:Denies numbness, tingling or new weaknesses Behavioral/Psych: Mood is stable, no new changes  Extremities: No lower extremity edema All other systems were reviewed with the patient and are negative.  I have reviewed the past medical history, past surgical history, social history and family history with the patient and they are unchanged from previous note.  ALLERGIES:  has No Known Allergies.  MEDICATIONS:  Current Outpatient Medications  Medication Sig Dispense Refill  . acetaminophen (TYLENOL) 500 MG tablet Take 1,000-1,500 mg by mouth 2 (two) times daily as needed for moderate pain.    . calcitRIOL (ROCALTROL) 0.5 MCG capsule Take 0.5 mcg by mouth 2 (two) times daily.     . calcium carbonate (TUMS - DOSED IN MG ELEMENTAL CALCIUM) 500 MG chewable tablet Chew 1 tablet by mouth daily.    . cinacalcet (SENSIPAR) 30 MG tablet Take 30 mg by mouth daily. With the largest meal of the day    . colchicine 0.6 MG tablet Take 0.6-1.2 mg by mouth daily as needed (for gout flare-ups).    . doxazosin (CARDURA) 8 MG tablet Take 8 mg by mouth every evening.    . famotidine (PEPCID) 20 MG tablet Take 20 mg by mouth every evening.     . finasteride (PROSCAR) 5 MG tablet Take 5 mg by mouth daily.    . furosemide (LASIX) 80 MG tablet Take 80 mg by mouth daily.    Marland Kitchen loperamide (IMODIUM) 2 MG capsule Take 1 capsule (2 mg total) by mouth as needed for diarrhea or loose stools. 30 capsule 0  . mycophenolate (CELLCEPT) 250 MG capsule Take 1,000 mg by mouth 2 (two) times daily.    . potassium chloride SA (K-DUR,KLOR-CON) 20 MEQ tablet Take 20 mEq by mouth 2 (two) times daily.    . predniSONE (DELTASONE) 5 MG tablet Take 1 tablet (5 mg total) by mouth daily with breakfast. 30 tablet 0  . tacrolimus (PROGRAF) 1 MG capsule Take 3 mg by mouth 2 (two) times daily.     . tacrolimus (PROGRAF) 5 MG capsule Take 5 mg by mouth 2 (two) times daily.      No current facility-administered medications for this visit.     PHYSICAL EXAMINATION: ECOG PERFORMANCE STATUS: 2 - Symptomatic, <50% confined to bed  Vitals:   06/16/18 1142  BP: 106/74  Pulse: (!) 53  Resp: 17  Temp: 97.7  F (36.5 C)  SpO2: (!) 78%   Filed Weights   06/16/18 1142  Weight: 140 lb (63.5 kg)    GENERAL:alert, no distress and comfortable SKIN: skin color, texture, turgor are normal, no rashes or significant lesions EYES: normal, Conjunctiva are pink and non-injected, sclera clear OROPHARYNX:no exudate, no erythema and lips, buccal mucosa, and tongue normal  NECK: supple, thyroid normal size, non-tender, without nodularity LYMPH:  no palpable lymphadenopathy in the cervical, axillary or inguinal LUNGS: clear to  auscultation and percussion with normal breathing effort HEART: regular rate & rhythm; ejection systolic murmur in the mitral area ABDOMEN:abdomen soft, non-tender and normal bowel sounds MUSCULOSKELETAL:no cyanosis of digits and no clubbing  NEURO: alert & oriented x 3 with fluent speech, no focal motor/sensory deficits EXTREMITIES: No lower extremity edema   LABORATORY DATA:  I have reviewed the data as listed CMP Latest Ref Rng & Units 06/16/2018 06/02/2018 06/01/2018  Glucose 70 - 99 mg/dL 124(H) 152(H) 149(H)  BUN 6 - 20 mg/dL 63(H) 53(H) 54(H)  Creatinine 0.61 - 1.24 mg/dL 4.95(HH) 3.97(H) 3.85(H)  Sodium 135 - 145 mmol/L 132(L) 134(L) 133(L)  Potassium 3.5 - 5.1 mmol/L 4.4 3.9 4.1  Chloride 98 - 111 mmol/L 107 109 112(H)  CO2 22 - 32 mmol/L 14(L) 15(L) 15(L)  Calcium 8.9 - 10.3 mg/dL 8.7(L) 8.5(L) 8.4(L)  Total Protein 6.5 - 8.1 g/dL 6.5 - -  Total Bilirubin 0.3 - 1.2 mg/dL 0.3 - -  Alkaline Phos 38 - 126 U/L 70 - -  AST 15 - 41 U/L 8(L) - -  ALT 0 - 44 U/L 9 - -    Lab Results  Component Value Date   WBC 4.3 06/16/2018   HGB 6.6 (LL) 06/16/2018   HCT 18.5 (L) 06/16/2018   MCV 81.1 06/16/2018   PLT 137 (L) 06/16/2018   NEUTROABS 2.4 06/16/2018    ASSESSMENT & PLAN:  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  Treatment plan: 1.  Transfusions every 2 weeks as needed 2.  Aranesp injections will start in 2 weeks final micrograms every 4 weeks  Patient is tachycardic and mildly hypotensive and wheelchair-bound with severe fatigue.  Patient will come every 2 weeks for blood work and transfusions as needed if hemoglobin is less than 8 Follow-up with me in 1 month.    Orders Placed This  Encounter  Procedures  . CBC with Differential (Cancer Center Only)    Standing Status:   Standing    Number of Occurrences:   20    Standing Expiration Date:   06/17/2019  . Sample to Blood Bank    Standing Status:   Standing    Number of Occurrences:   20    Standing Expiration Date:   06/17/2019   The patient has a good understanding of the overall plan. he agrees with it. he will call with any problems that may develop before the next visit here.   Harriette Ohara, MD 06/16/18

## 2018-06-16 NOTE — Telephone Encounter (Signed)
Scheduled appt per 1/24 sch message - pt is aware of appt time and date.

## 2018-06-16 NOTE — Telephone Encounter (Signed)
Gave AVS and calendar °

## 2018-06-16 NOTE — Telephone Encounter (Signed)
Noted. Dr.Gudena aware. Pt scheduled for 2 units prbc tomorrow 06/17/2018 (Saturday). Transportation set up through [employer]. Blood bank aware of orders for tomorrow.

## 2018-06-16 NOTE — Assessment & Plan Note (Signed)
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  Treatment plan: 1.  Transfusions every 2 weeks as needed 2.  Aranesp injections will start in 2 weeks final micrograms every 4 weeks  Patient is tachycardic and mildly hypotensive and wheelchair-bound with severe fatigue.  Patient will come every 2 weeks for blood work and transfusions as needed if hemoglobin is less than 8 Follow-up with me in 1 month.

## 2018-06-16 NOTE — Telephone Encounter (Signed)
[facility]. Tech call report.  Today's Hgb = 6.6.  Message left with results.    12:32 pm spoke with -patient.  Reports "feeling light-headed this morning.  Better now sitting in wheelchair.  No shortness of breath or pain.  B/P was lower.  At home = 100/80.  Asked to wait for next steps.

## 2018-06-16 NOTE — Telephone Encounter (Signed)
Escorted patient to Lab.  Instructed not to remove blood bracelet and go directly to infusion room tomorrow am.  Solicitor notified of transportation need in am.

## 2018-06-17 ENCOUNTER — Inpatient Hospital Stay: Payer: Medicare Other

## 2018-06-17 DIAGNOSIS — D631 Anemia in chronic kidney disease: Secondary | ICD-10-CM

## 2018-06-17 DIAGNOSIS — N184 Chronic kidney disease, stage 4 (severe): Secondary | ICD-10-CM | POA: Diagnosis not present

## 2018-06-17 MED ORDER — DIPHENHYDRAMINE HCL 25 MG PO CAPS
25.0000 mg | ORAL_CAPSULE | Freq: Once | ORAL | Status: AC
  Administered 2018-06-17: 25 mg via ORAL

## 2018-06-17 MED ORDER — SODIUM CHLORIDE 0.9 % IV SOLN
INTRAVENOUS | Status: DC
  Filled 2018-06-17: qty 250

## 2018-06-17 MED ORDER — DIPHENHYDRAMINE HCL 25 MG PO CAPS
ORAL_CAPSULE | ORAL | Status: AC
  Filled 2018-06-17: qty 1

## 2018-06-17 MED ORDER — ACETAMINOPHEN 325 MG PO TABS
650.0000 mg | ORAL_TABLET | Freq: Once | ORAL | Status: AC
  Administered 2018-06-17: 650 mg via ORAL

## 2018-06-17 MED ORDER — ACETAMINOPHEN 325 MG PO TABS
ORAL_TABLET | ORAL | Status: AC
  Filled 2018-06-17: qty 2

## 2018-06-17 MED ORDER — SODIUM CHLORIDE 0.9% IV SOLUTION
250.0000 mL | Freq: Once | INTRAVENOUS | Status: AC
  Administered 2018-06-17: 250 mL via INTRAVENOUS
  Filled 2018-06-17: qty 250

## 2018-06-17 NOTE — Progress Notes (Signed)
Pt's IV read occluded at around 1000, blood transfusion stopped.  Assessed by RN Learta Codding and Elmyra Ricks, unable to flush or draw back on IV.  Pt denies pain or burning or any other changes/concerns.  Unable to continue using IV, no signs of infiltration or redness.  2nd IV attempt by RN Learta Codding in [place] unsuccessful, 3rd attempt by LPN Santiago Glad in [place] successful.  Blood restarted.  No concerns/complaints at this time.  VSS.  A&Ox4.  Will continue to monitor.

## 2018-06-17 NOTE — Patient Instructions (Signed)

## 2018-06-19 LAB — TYPE AND SCREEN
ABO/RH(D): O NEG
Antibody Screen: NEGATIVE
Donor AG Type: NEGATIVE
Donor AG Type: NEGATIVE
Unit division: 0
Unit division: 0

## 2018-06-19 LAB — BPAM RBC
Blood Product Expiration Date: 202002222359
Blood Product Expiration Date: 202002222359
ISSUE DATE / TIME: 202001250907
ISSUE DATE / TIME: 202001250907
Unit Type and Rh: 9500
Unit Type and Rh: 9500

## 2018-06-20 ENCOUNTER — Other Ambulatory Visit: Payer: Self-pay | Admitting: Oncology

## 2018-06-29 ENCOUNTER — Encounter (HOSPITAL_COMMUNITY): Payer: Medicare Other

## 2018-06-30 ENCOUNTER — Other Ambulatory Visit: Payer: Self-pay

## 2018-06-30 ENCOUNTER — Telehealth: Payer: Self-pay | Admitting: *Deleted

## 2018-06-30 ENCOUNTER — Inpatient Hospital Stay: Payer: Medicare Other

## 2018-06-30 ENCOUNTER — Inpatient Hospital Stay: Payer: Medicare Other | Attending: Hematology and Oncology

## 2018-06-30 ENCOUNTER — Inpatient Hospital Stay: Payer: Medicare Other | Admitting: Hematology and Oncology

## 2018-06-30 VITALS — BP 102/67 | HR 82 | Temp 98.4°F | Resp 18

## 2018-06-30 DIAGNOSIS — N184 Chronic kidney disease, stage 4 (severe): Secondary | ICD-10-CM | POA: Diagnosis present

## 2018-06-30 DIAGNOSIS — D631 Anemia in chronic kidney disease: Secondary | ICD-10-CM

## 2018-06-30 DIAGNOSIS — D649 Anemia, unspecified: Secondary | ICD-10-CM

## 2018-06-30 DIAGNOSIS — Z940 Kidney transplant status: Secondary | ICD-10-CM

## 2018-06-30 LAB — CBC WITH DIFFERENTIAL (CANCER CENTER ONLY)
Abs Immature Granulocytes: 0.04 10*3/uL (ref 0.00–0.07)
BASOS ABS: 0 10*3/uL (ref 0.0–0.1)
Basophils Relative: 0 %
Eosinophils Absolute: 0.1 10*3/uL (ref 0.0–0.5)
Eosinophils Relative: 3 %
HCT: 16.8 % — ABNORMAL LOW (ref 39.0–52.0)
Hemoglobin: 5.8 g/dL — CL (ref 13.0–17.0)
Immature Granulocytes: 1 %
LYMPHS ABS: 1 10*3/uL (ref 0.7–4.0)
Lymphocytes Relative: 34 %
MCH: 28.7 pg (ref 26.0–34.0)
MCHC: 34.5 g/dL (ref 30.0–36.0)
MCV: 83.2 fL (ref 80.0–100.0)
Monocytes Absolute: 0.3 10*3/uL (ref 0.1–1.0)
Monocytes Relative: 11 %
NRBC: 0 % (ref 0.0–0.2)
Neutro Abs: 1.5 10*3/uL — ABNORMAL LOW (ref 1.7–7.7)
Neutrophils Relative %: 51 %
Platelet Count: 131 10*3/uL — ABNORMAL LOW (ref 150–400)
RBC: 2.02 MIL/uL — ABNORMAL LOW (ref 4.22–5.81)
RDW: 13.8 % (ref 11.5–15.5)
WBC Count: 3 10*3/uL — ABNORMAL LOW (ref 4.0–10.5)

## 2018-06-30 LAB — COMPREHENSIVE METABOLIC PANEL
ALT: <6 U/L (ref 0–44)
AST: 7 U/L — ABNORMAL LOW (ref 15–41)
Albumin: 3.4 g/dL — ABNORMAL LOW (ref 3.5–5.0)
Alkaline Phosphatase: 69 U/L (ref 38–126)
Anion gap: 10 (ref 5–15)
BUN: 40 mg/dL — ABNORMAL HIGH (ref 6–20)
CO2: 15 mmol/L — ABNORMAL LOW (ref 22–32)
Calcium: 8.1 mg/dL — ABNORMAL LOW (ref 8.9–10.3)
Chloride: 110 mmol/L (ref 98–111)
Creatinine, Ser: 4.07 mg/dL (ref 0.61–1.24)
GFR calc Af Amer: 18 mL/min — ABNORMAL LOW (ref >60)
GFR calc non Af Amer: 16 mL/min — ABNORMAL LOW (ref >60)
GLUCOSE: 121 mg/dL — AB (ref 70–99)
Potassium: 3.5 mmol/L (ref 3.5–5.1)
Sodium: 135 mmol/L (ref 135–145)
TOTAL PROTEIN: 6 g/dL — AB (ref 6.5–8.1)
Total Bilirubin: 0.5 mg/dL (ref 0.3–1.2)

## 2018-06-30 LAB — RETICULOCYTES
Immature Retic Fract: 4.2 % (ref 2.3–15.9)
RBC.: 2.02 MIL/uL — ABNORMAL LOW (ref 4.22–5.81)
Retic Count, Absolute: 3.8 10*3/uL — ABNORMAL LOW (ref 19.0–186.0)
Retic Ct Pct: 0.2 % — ABNORMAL LOW (ref 0.4–3.1)

## 2018-06-30 LAB — PREPARE RBC (CROSSMATCH)

## 2018-06-30 MED ORDER — DIPHENHYDRAMINE HCL 25 MG PO CAPS
ORAL_CAPSULE | ORAL | Status: AC
  Filled 2018-06-30: qty 1

## 2018-06-30 MED ORDER — DARBEPOETIN ALFA 500 MCG/ML IJ SOSY
PREFILLED_SYRINGE | INTRAMUSCULAR | Status: AC
  Filled 2018-06-30: qty 1

## 2018-06-30 MED ORDER — DIPHENHYDRAMINE HCL 25 MG PO CAPS
25.0000 mg | ORAL_CAPSULE | Freq: Once | ORAL | Status: AC
  Administered 2018-06-30: 25 mg via ORAL

## 2018-06-30 MED ORDER — SODIUM CHLORIDE 0.9% IV SOLUTION
250.0000 mL | Freq: Once | INTRAVENOUS | Status: AC
  Administered 2018-06-30: 250 mL via INTRAVENOUS
  Filled 2018-06-30: qty 250

## 2018-06-30 MED ORDER — ACETAMINOPHEN 325 MG PO TABS
650.0000 mg | ORAL_TABLET | Freq: Once | ORAL | Status: AC
  Administered 2018-06-30: 650 mg via ORAL

## 2018-06-30 MED ORDER — ACETAMINOPHEN 325 MG PO TABS
ORAL_TABLET | ORAL | Status: AC
  Filled 2018-06-30: qty 2

## 2018-06-30 MED ORDER — DARBEPOETIN ALFA 500 MCG/ML IJ SOSY
500.0000 ug | PREFILLED_SYRINGE | Freq: Once | INTRAMUSCULAR | Status: AC
  Administered 2018-06-30: 500 ug via SUBCUTANEOUS

## 2018-06-30 NOTE — Patient Instructions (Signed)
Blood Transfusion, Adult, Care After This sheet gives you information about how to care for yourself after your procedure. Your doctor may also give you more specific instructions. If you have problems or questions, contact your doctor. Follow these instructions at home:   Take over-the-counter and prescription medicines only as told by your doctor.  Go back to your normal activities as told by your doctor.  Follow instructions from your doctor about how to take care of the area where an IV tube was put into your vein (insertion site). Make sure you: ? Wash your hands with soap and water before you change your bandage (dressing). If there is no soap and water, use hand sanitizer. ? Change your bandage as told by your doctor.  Check your IV insertion site every day for signs of infection. Check for: ? More redness, swelling, or pain. ? More fluid or blood. ? Warmth. ? Pus or a bad smell. Contact a doctor if:  You have more redness, swelling, or pain around the IV insertion site.  You have more fluid or blood coming from the IV insertion site.  Your IV insertion site feels warm to the touch.  You have pus or a bad smell coming from the IV insertion site.  Your pee (urine) turns pink, red, or brown.  You feel weak after doing your normal activities. Get help right away if:  You have signs of a serious allergic or body defense (immune) system reaction, including: ? Itchiness. ? Hives. ? Trouble breathing. ? Anxiety. ? Pain in your chest or lower back. ? Fever, flushing, and chills. ? Fast pulse. ? Rash. ? Watery poop (diarrhea). ? Throwing up (vomiting). ? Dark pee. ? Serious headache. ? Dizziness. ? Stiff neck. ? Yellow color in your face or the white parts of your eyes (jaundice). Summary  After a blood transfusion, return to your normal activities as told by your doctor.  Every day, check for signs of infection where the IV tube was put into your vein.  Some  signs of infection are warm skin, more redness and pain, more fluid or blood, and pus or a bad smell where the needle went in.  Contact your doctor if you feel weak or have any unusual symptoms. This information is not intended to replace advice given to you by your health care provider. Make sure you discuss any questions you have with your health care provider. Document Released: 05/31/2014 Document Revised: 01/02/2016 Document Reviewed: 01/02/2016 Elsevier Interactive Patient Education  2019 [employer] Inc[employer]  

## 2018-06-30 NOTE — Assessment & Plan Note (Signed)
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  Treatment plan: 1.  Transfusions every 2 weeks as needed 2.  Aranesp injections 500 micrograms every 4 weeks  Lab review:

## 2018-06-30 NOTE — Telephone Encounter (Signed)
Received fax this am from [facility] regarding a call they received from patient. Pt stated he needed transportaton for this mornings appt.  Message received @ 8:01 am for 8:45 appt.  Notified Edwyna Shell and Gwinda Maine in Social Work.

## 2018-06-30 NOTE — Progress Notes (Signed)
Dr. [name] notified of critical values of Hgb and Creatinine.

## 2018-07-01 LAB — TYPE AND SCREEN
ABO/RH(D): O NEG
Antibody Screen: NEGATIVE
DONOR AG TYPE: NEGATIVE
Donor AG Type: NEGATIVE
UNIT DIVISION: 0
Unit division: 0

## 2018-07-01 LAB — BPAM RBC
Blood Product Expiration Date: 202003042359
Blood Product Expiration Date: 202003072359
ISSUE DATE / TIME: 202002071326
ISSUE DATE / TIME: 202002071326
Unit Type and Rh: 9500
Unit Type and Rh: 9500

## 2018-07-14 ENCOUNTER — Inpatient Hospital Stay: Payer: Medicare Other

## 2018-07-14 ENCOUNTER — Other Ambulatory Visit: Payer: Self-pay

## 2018-07-14 DIAGNOSIS — N184 Chronic kidney disease, stage 4 (severe): Secondary | ICD-10-CM | POA: Diagnosis not present

## 2018-07-14 DIAGNOSIS — D649 Anemia, unspecified: Secondary | ICD-10-CM

## 2018-07-14 DIAGNOSIS — Z940 Kidney transplant status: Secondary | ICD-10-CM

## 2018-07-14 DIAGNOSIS — D631 Anemia in chronic kidney disease: Secondary | ICD-10-CM

## 2018-07-14 LAB — COMPREHENSIVE METABOLIC PANEL
ALT: <6 U/L (ref 0–44)
AST: 9 U/L — ABNORMAL LOW (ref 15–41)
Albumin: 3.6 g/dL (ref 3.5–5.0)
Alkaline Phosphatase: 86 U/L (ref 38–126)
Anion gap: 11 (ref 5–15)
BUN: 35 mg/dL — ABNORMAL HIGH (ref 6–20)
CO2: 14 mmol/L — ABNORMAL LOW (ref 22–32)
Calcium: 7.8 mg/dL — ABNORMAL LOW (ref 8.9–10.3)
Chloride: 114 mmol/L — ABNORMAL HIGH (ref 98–111)
Creatinine, Ser: 3.34 mg/dL (ref 0.61–1.24)
GFR calc Af Amer: 23 mL/min — ABNORMAL LOW (ref >60)
GFR calc non Af Amer: 20 mL/min — ABNORMAL LOW (ref >60)
Glucose, Bld: 154 mg/dL — ABNORMAL HIGH (ref 70–99)
POTASSIUM: 3.8 mmol/L (ref 3.5–5.1)
Sodium: 139 mmol/L (ref 135–145)
TOTAL PROTEIN: 6.4 g/dL — AB (ref 6.5–8.1)
Total Bilirubin: 0.3 mg/dL (ref 0.3–1.2)

## 2018-07-14 LAB — CBC WITH DIFFERENTIAL (CANCER CENTER ONLY)
Abs Immature Granulocytes: 0.05 10*3/uL (ref 0.00–0.07)
Basophils Absolute: 0 10*3/uL (ref 0.0–0.1)
Basophils Relative: 0 %
Eosinophils Absolute: 0.1 10*3/uL (ref 0.0–0.5)
Eosinophils Relative: 1 %
HCT: 19.6 % — ABNORMAL LOW (ref 39.0–52.0)
Hemoglobin: 6.7 g/dL — CL (ref 13.0–17.0)
IMMATURE GRANULOCYTES: 1 %
Lymphocytes Relative: 27 %
Lymphs Abs: 1.6 10*3/uL (ref 0.7–4.0)
MCH: 29 pg (ref 26.0–34.0)
MCHC: 34.2 g/dL (ref 30.0–36.0)
MCV: 84.8 fL (ref 80.0–100.0)
Monocytes Absolute: 0.4 10*3/uL (ref 0.1–1.0)
Monocytes Relative: 7 %
Neutro Abs: 3.6 10*3/uL (ref 1.7–7.7)
Neutrophils Relative %: 64 %
Platelet Count: 178 10*3/uL (ref 150–400)
RBC: 2.31 MIL/uL — ABNORMAL LOW (ref 4.22–5.81)
RDW: 13.8 % (ref 11.5–15.5)
WBC Count: 5.7 10*3/uL (ref 4.0–10.5)
nRBC: 0 % (ref 0.0–0.2)

## 2018-07-14 LAB — SAMPLE TO BLOOD BANK

## 2018-07-14 LAB — PREPARE RBC (CROSSMATCH)

## 2018-07-14 MED ORDER — DIPHENHYDRAMINE HCL 25 MG PO CAPS
25.0000 mg | ORAL_CAPSULE | Freq: Once | ORAL | Status: AC
  Administered 2018-07-14: 25 mg via ORAL

## 2018-07-14 MED ORDER — SODIUM CHLORIDE 0.9% IV SOLUTION
250.0000 mL | Freq: Once | INTRAVENOUS | Status: AC
  Administered 2018-07-14: 250 mL via INTRAVENOUS
  Filled 2018-07-14: qty 250

## 2018-07-14 MED ORDER — ACETAMINOPHEN 325 MG PO TABS
ORAL_TABLET | ORAL | Status: AC
  Filled 2018-07-14: qty 2

## 2018-07-14 MED ORDER — DIPHENHYDRAMINE HCL 25 MG PO CAPS
ORAL_CAPSULE | ORAL | Status: AC
  Filled 2018-07-14: qty 1

## 2018-07-14 MED ORDER — ACETAMINOPHEN 325 MG PO TABS
650.0000 mg | ORAL_TABLET | Freq: Once | ORAL | Status: AC
  Administered 2018-07-14: 650 mg via ORAL

## 2018-07-14 NOTE — Patient Instructions (Signed)

## 2018-07-14 NOTE — Progress Notes (Signed)
Dr. [name] aware of Hgb of 6.7.  Verbal orders given for 2 units of blood.  Infusion nurse aware.

## 2018-07-14 NOTE — Progress Notes (Signed)
Dr. [name] aware of critical results for creatinine 3.34.  Pt with stage 4 CKD.

## 2018-07-15 LAB — BPAM RBC
Blood Product Expiration Date: 202003202359
Blood Product Expiration Date: 202003232359
ISSUE DATE / TIME: 202002211208
ISSUE DATE / TIME: 202002211208
Unit Type and Rh: 9500
Unit Type and Rh: 9500

## 2018-07-15 LAB — TYPE AND SCREEN
ABO/RH(D): O NEG
Antibody Screen: NEGATIVE
Donor AG Type: NEGATIVE
Donor AG Type: NEGATIVE
Unit division: 0
Unit division: 0

## 2018-07-16 ENCOUNTER — Other Ambulatory Visit: Payer: Self-pay | Admitting: Oncology

## 2018-07-17 ENCOUNTER — Ambulatory Visit: Payer: Medicare Other | Admitting: Hematology and Oncology

## 2018-07-17 ENCOUNTER — Other Ambulatory Visit: Payer: Medicare Other

## 2018-07-18 ENCOUNTER — Telehealth: Payer: Self-pay | Admitting: Hematology and Oncology

## 2018-07-18 NOTE — Telephone Encounter (Signed)
Per sch msg, patient wants to reschedule 2/24 appt. No answer, left message and mailed schedule for new appt date and time

## 2018-07-25 ENCOUNTER — Other Ambulatory Visit: Payer: Medicare Other

## 2018-07-25 ENCOUNTER — Ambulatory Visit: Payer: Medicare Other | Admitting: Hematology and Oncology

## 2018-07-31 ENCOUNTER — Other Ambulatory Visit: Payer: Self-pay | Admitting: Hematology and Oncology

## 2018-07-31 DIAGNOSIS — Z940 Kidney transplant status: Secondary | ICD-10-CM

## 2018-07-31 DIAGNOSIS — D649 Anemia, unspecified: Secondary | ICD-10-CM

## 2018-08-01 ENCOUNTER — Telehealth: Payer: Self-pay | Admitting: *Deleted

## 2018-08-01 ENCOUNTER — Inpatient Hospital Stay: Payer: Medicare Other | Attending: Hematology and Oncology

## 2018-08-01 ENCOUNTER — Inpatient Hospital Stay (HOSPITAL_BASED_OUTPATIENT_CLINIC_OR_DEPARTMENT_OTHER): Payer: Medicare Other | Admitting: Hematology and Oncology

## 2018-08-01 ENCOUNTER — Other Ambulatory Visit: Payer: Self-pay

## 2018-08-01 VITALS — BP 141/71 | HR 104 | Temp 98.3°F | Resp 18 | Ht 66.0 in | Wt 150.3 lb

## 2018-08-01 DIAGNOSIS — D649 Anemia, unspecified: Secondary | ICD-10-CM

## 2018-08-01 DIAGNOSIS — Z79899 Other long term (current) drug therapy: Secondary | ICD-10-CM

## 2018-08-01 DIAGNOSIS — Z940 Kidney transplant status: Secondary | ICD-10-CM

## 2018-08-01 DIAGNOSIS — N184 Chronic kidney disease, stage 4 (severe): Secondary | ICD-10-CM | POA: Insufficient documentation

## 2018-08-01 DIAGNOSIS — D631 Anemia in chronic kidney disease: Secondary | ICD-10-CM

## 2018-08-01 LAB — CBC WITH DIFFERENTIAL (CANCER CENTER ONLY)
Abs Immature Granulocytes: 0.13 10*3/uL — ABNORMAL HIGH (ref 0.00–0.07)
Basophils Absolute: 0 10*3/uL (ref 0.0–0.1)
Basophils Relative: 0 %
EOS PCT: 2 %
Eosinophils Absolute: 0.1 10*3/uL (ref 0.0–0.5)
HCT: 18.5 % — ABNORMAL LOW (ref 39.0–52.0)
Hemoglobin: 6.3 g/dL — CL (ref 13.0–17.0)
Immature Granulocytes: 2 %
Lymphocytes Relative: 22 %
Lymphs Abs: 1.3 10*3/uL (ref 0.7–4.0)
MCH: 29 pg (ref 26.0–34.0)
MCHC: 34.1 g/dL (ref 30.0–36.0)
MCV: 85.3 fL (ref 80.0–100.0)
MONO ABS: 0.4 10*3/uL (ref 0.1–1.0)
MONOS PCT: 7 %
Neutro Abs: 3.9 10*3/uL (ref 1.7–7.7)
Neutrophils Relative %: 67 %
Platelet Count: 189 10*3/uL (ref 150–400)
RBC: 2.17 MIL/uL — ABNORMAL LOW (ref 4.22–5.81)
RDW: 14.1 % (ref 11.5–15.5)
WBC Count: 5.8 10*3/uL (ref 4.0–10.5)
nRBC: 0 % (ref 0.0–0.2)

## 2018-08-01 LAB — CMP (CANCER CENTER ONLY)
ALT: 8 U/L (ref 0–44)
AST: 11 U/L — ABNORMAL LOW (ref 15–41)
Albumin: 3.8 g/dL (ref 3.5–5.0)
Alkaline Phosphatase: 91 U/L (ref 38–126)
Anion gap: 12 (ref 5–15)
BUN: 46 mg/dL — ABNORMAL HIGH (ref 6–20)
CO2: 19 mmol/L — ABNORMAL LOW (ref 22–32)
Calcium: 8.1 mg/dL — ABNORMAL LOW (ref 8.9–10.3)
Chloride: 111 mmol/L (ref 98–111)
Creatinine: 3.33 mg/dL (ref 0.61–1.24)
GFR, Est AFR Am: 23 mL/min — ABNORMAL LOW (ref >60)
GFR, Estimated: 20 mL/min — ABNORMAL LOW (ref >60)
Glucose, Bld: 155 mg/dL — ABNORMAL HIGH (ref 70–99)
Potassium: 3.8 mmol/L (ref 3.5–5.1)
Sodium: 142 mmol/L (ref 135–145)
TOTAL PROTEIN: 6.5 g/dL (ref 6.5–8.1)
Total Bilirubin: 0.4 mg/dL (ref 0.3–1.2)

## 2018-08-01 LAB — SAMPLE TO BLOOD BANK

## 2018-08-01 LAB — PREPARE RBC (CROSSMATCH)

## 2018-08-01 MED ORDER — DARBEPOETIN ALFA 500 MCG/ML IJ SOSY
500.0000 ug | PREFILLED_SYRINGE | Freq: Once | INTRAMUSCULAR | Status: AC
  Administered 2018-08-01: 500 ug via SUBCUTANEOUS

## 2018-08-01 MED ORDER — DARBEPOETIN ALFA 500 MCG/ML IJ SOSY
PREFILLED_SYRINGE | INTRAMUSCULAR | Status: AC
  Filled 2018-08-01: qty 1

## 2018-08-01 NOTE — Telephone Encounter (Signed)
Mars[facility] MT call report today's Creat = 3.33.  Scheduled F/U today 2:45 pm.

## 2018-08-01 NOTE — Progress Notes (Signed)
Patient Care Team: Rexene Agent, MD as PCP - General (Nephrology)  DIAGNOSIS:    ICD-10-CM   1. 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 Anemia, unspecified type D64.9 Transfuse RBC    Prepare RBC    Prepare RBC    Type and screen    Ferritin    Iron and TIBC    CBC with Differential (Cancer Center Only)    Sample to Blood Bank    CHIEF COMPLIANT: Follow-up of severe anemia of chronic kidney disease  INTERVAL HISTORY: Austin Murphy is a 53 y.o. with above-mentioned history of severe anemia requiring blood transfusions and history of stage 4 renal disease. He presents to the clinic alone today. He reports improvement in his energy levels and recent weight gain and denies weakness, dizziness, or lightheadedness. His labs today show: Hg 6.3, HCT 18.5, platelets 189, WBC 5.8. He reviewed his medication list with me.   REVIEW OF SYSTEMS:   Constitutional: Denies fevers, chills or abnormal weight loss Eyes: Denies blurriness of vision Ears, nose, mouth, throat, and face: Denies mucositis or sore throat Respiratory: Denies cough, dyspnea or wheezes Cardiovascular: Denies palpitation, chest discomfort Gastrointestinal: Denies nausea, heartburn or change in bowel habits Skin: Denies abnormal skin rashes Lymphatics: Denies new lymphadenopathy or easy bruising Neurological: Denies numbness, tingling or new weaknesses Behavioral/Psych: Mood is stable, no new changes  Extremities: No lower extremity edema All other systems were reviewed with the patient and are negative.  I have reviewed the past medical history, past surgical history, social history and family history with the patient and they are unchanged from previous note.  ALLERGIES:  has No Known Allergies.  MEDICATIONS:  Current Outpatient Medications  Medication Sig Dispense Refill  .  acetaminophen (TYLENOL) 500 MG tablet Take 1,000-1,500 mg by mouth 2 (two) times daily as needed for moderate pain.    . calcitRIOL (ROCALTROL) 0.5 MCG capsule Take 0.5 mcg by mouth 2 (two) times daily.    . calcium carbonate (TUMS - DOSED IN MG ELEMENTAL CALCIUM) 500 MG chewable tablet Chew 1 tablet by mouth daily.    . cinacalcet (SENSIPAR) 30 MG tablet Take 30 mg by mouth daily. With the largest meal of the day    . colchicine 0.6 MG tablet Take 0.6-1.2 mg by mouth daily as needed (for gout flare-ups).    . doxazosin (CARDURA) 8 MG tablet Take 8 mg by mouth every evening.    . famotidine (PEPCID) 20 MG tablet Take 20 mg by mouth every evening.     . finasteride (PROSCAR) 5 MG tablet Take 5 mg by mouth daily.    . furosemide (LASIX) 80 MG tablet Take 80 mg by mouth daily.    Marland Kitchen loperamide (IMODIUM) 2 MG capsule Take 1 capsule (2 mg total) by mouth as needed for diarrhea or loose stools. 30 capsule 0  . mycophenolate (CELLCEPT) 250 MG capsule Take 1,000 mg by mouth 2 (two) times daily.    . potassium chloride SA (K-DUR,KLOR-CON) 20 MEQ tablet Take 20 mEq by mouth 2 (two) times daily.    . predniSONE (DELTASONE) 5 MG tablet Take 1 tablet (5 mg total) by mouth daily with breakfast. 30 tablet 0  . tacrolimus (PROGRAF) 1 MG capsule Take 3 mg by mouth 2 (two) times daily.     . tacrolimus (PROGRAF)  5 MG capsule Take 5 mg by mouth 2 (two) times daily.      No current facility-administered medications for this visit.     PHYSICAL EXAMINATION: ECOG PERFORMANCE STATUS: 1 - Symptomatic but completely ambulatory  Vitals:   08/01/18 1448  BP: (!) 141/71  Pulse: (!) 104  Resp: 18  Temp: 98.3 F (36.8 C)  SpO2: 100%   Filed Weights   08/01/18 1448  Weight: 150 lb 4.8 oz (68.2 kg)    GENERAL: alert, no distress and comfortable SKIN: skin color, texture, turgor are normal, no rashes or significant lesions EYES: normal, Conjunctiva are pink and non-injected, sclera clear OROPHARYNX: no exudate,  no erythema and lips, buccal mucosa, and tongue normal  NECK: supple, thyroid normal size, non-tender, without nodularity LYMPH: no palpable lymphadenopathy in the cervical, axillary or inguinal LUNGS: clear to auscultation and percussion with normal breathing effort HEART: regular rate & rhythm and no murmurs and no lower extremity edema ABDOMEN: abdomen soft, non-tender and normal bowel sounds MUSCULOSKELETAL: no cyanosis of digits and no clubbing  NEURO: alert & oriented x 3 with fluent speech, no focal motor/sensory deficits EXTREMITIES: No lower extremity edema  LABORATORY DATA:  I have reviewed the data as listed CMP Latest Ref Rng & Units 07/14/2018 06/30/2018 06/16/2018  Glucose 70 - 99 mg/dL 154(H) 121(H) 124(H)  BUN 6 - 20 mg/dL 35(H) 40(H) 63(H)  Creatinine 0.61 - 1.24 mg/dL 3.34(HH) 4.07(HH) 4.95(HH)  Sodium 135 - 145 mmol/L 139 135 132(L)  Potassium 3.5 - 5.1 mmol/L 3.8 3.5 4.4  Chloride 98 - 111 mmol/L 114(H) 110 107  CO2 22 - 32 mmol/L 14(L) 15(L) 14(L)  Calcium 8.9 - 10.3 mg/dL 7.8(L) 8.1(L) 8.7(L)  Total Protein 6.5 - 8.1 g/dL 6.4(L) 6.0(L) 6.5  Total Bilirubin 0.3 - 1.2 mg/dL 0.3 0.5 0.3  Alkaline Phos 38 - 126 U/L 86 69 70  AST 15 - 41 U/L 9(L) 7(L) 8(L)  ALT 0 - 44 U/L <6 <6 9    Lab Results  Component Value Date   WBC 5.8 08/01/2018   HGB 6.3 (LL) 08/01/2018   HCT 18.5 (L) 08/01/2018   MCV 85.3 08/01/2018   PLT 189 08/01/2018   NEUTROABS 3.9 08/01/2018    ASSESSMENT & PLAN:  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  Treatment plan: 1.  Transfusions every 2 weeks as needed 2.  Aranesp injections 500 micrograms every 4 weeks  Lab  review: Hemoglobin 6.3 today. Tomorrow he gets 2 units of PRBC at sick[facility] cell infusion area Every 2 weeks with labs and blood transfusions. We will start checking iron levels to see if he gets iron overload. Aranesp will be given today.  I will see him back in 1 month    Orders Placed This Encounter  Procedures  . Ferritin    Standing Status:   Standing    Number of Occurrences:   20    Standing Expiration Date:   08/01/2019  . Iron and TIBC    Standing Status:   Standing    Number of Occurrences:   20    Standing Expiration Date:   08/01/2019  . CBC with Differential (Cancer Center Only)    Standing Status:   Standing  Number of Occurrences:   20    Standing Expiration Date:   08/01/2019  . Prepare RBC    Standing Status:   Standing    Number of Occurrences:   1    Order Specific Question:   # of Units    Answer:   2 units    Order Specific Question:   Transfusion Indications    Answer:   Symptomatic Anemia    Order Specific Question:   If emergent release call blood bank    Answer:   Not emergent release  . Sample to Blood Bank    Standing Status:   Standing    Number of Occurrences:   20    Standing Expiration Date:   08/01/2019   The patient has a good understanding of the overall plan. he agrees with it. he will call with any problems that may develop before the next visit here.  [name], MD 08/01/2018  Julious Oka Dorshimer am acting as scribe for Dr. [name].  I have reviewed the above documentation for accuracy and completeness, and I agree with the above.

## 2018-08-01 NOTE — Assessment & Plan Note (Signed)
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  Treatment plan: 1.  Transfusions every 2 weeks as needed 2.  Aranesp injections 500 micrograms every 4 weeks  Lab review:  Every 2 weeks with labs and blood transfusions.

## 2018-08-02 ENCOUNTER — Encounter (HOSPITAL_COMMUNITY): Payer: Medicare Other

## 2018-08-02 ENCOUNTER — Telehealth: Payer: Self-pay

## 2018-08-02 NOTE — Telephone Encounter (Signed)
Called pt to discuss about his missed blood transfusion appt this morning at [facility]. This nurse was notified of missed appt for blood transfusion due to having no transportation today. Called pt to let him know that we can set him up with transportation to the cancer center and have him take the shuttle to [facility] tomorrow. Pt agreeable to plan.   Contacted transportation coordinator to set up appt for tomorrow and arrive at 730am at the cancer center, then ride shuttle to sickle cell unit. Pt familiar with sickle cell location.   Confirmed time/date with patient, sickle cell dept, blood bank and transportation coordinator.

## 2018-08-03 ENCOUNTER — Other Ambulatory Visit: Payer: Self-pay

## 2018-08-03 ENCOUNTER — Ambulatory Visit (HOSPITAL_COMMUNITY)
Admission: RE | Admit: 2018-08-03 | Discharge: 2018-08-03 | Disposition: A | Discharge status: Home / Self Care | Payer: Medicare Other | Source: Ambulatory Visit | Attending: Hematology and Oncology | Admitting: Hematology and Oncology

## 2018-08-03 DIAGNOSIS — D649 Anemia, unspecified: Secondary | ICD-10-CM | POA: Insufficient documentation

## 2018-08-03 DIAGNOSIS — N184 Chronic kidney disease, stage 4 (severe): Secondary | ICD-10-CM | POA: Diagnosis not present

## 2018-08-03 LAB — CMV DNA, QUANTITATIVE, PCR
CMV DNA Quant: POSITIVE IU/mL
Log10 CMV Qn DNA Pl: UNDETERMINED log10 IU/mL

## 2018-08-03 LAB — PREPARE RBC (CROSSMATCH)

## 2018-08-03 MED ORDER — HEPARIN SOD (PORK) LOCK FLUSH 100 UNIT/ML IV SOLN: 250.0000 [IU] | INTRAVENOUS | Status: DC | PRN

## 2018-08-03 MED ORDER — DIPHENHYDRAMINE HCL 25 MG PO CAPS
25.0000 mg | ORAL_CAPSULE | Freq: Once | ORAL | Status: AC
  Administered 2018-08-03: 25 mg via ORAL
  Filled 2018-08-03: qty 1

## 2018-08-03 MED ORDER — SODIUM CHLORIDE 0.9% FLUSH: 10.0000 mL | INTRAVENOUS | Status: DC | PRN

## 2018-08-03 MED ORDER — SODIUM CHLORIDE 0.9% IV SOLUTION: 250.0000 mL | Freq: Once | INTRAVENOUS | Status: DC

## 2018-08-03 MED ORDER — ACETAMINOPHEN 325 MG PO TABS
650.0000 mg | ORAL_TABLET | Freq: Once | ORAL | Status: AC
  Administered 2018-08-03: 650 mg via ORAL
  Filled 2018-08-03: qty 2

## 2018-08-03 MED ORDER — SODIUM CHLORIDE 0.9% IV SOLUTION
Freq: Once | INTRAVENOUS | Status: AC
  Administered 2018-08-03: 11:00:00 via INTRAVENOUS

## 2018-08-03 MED ORDER — HEPARIN SOD (PORK) LOCK FLUSH 100 UNIT/ML IV SOLN: 500.0000 [IU] | Freq: Every day | INTRAVENOUS | Status: DC | PRN

## 2018-08-03 MED ORDER — SODIUM CHLORIDE 0.9% FLUSH: 3.0000 mL | INTRAVENOUS | Status: DC | PRN

## 2018-08-03 NOTE — Discharge Instructions (Signed)
Blood Transfusion, Adult, Care After This sheet gives you information about how to care for yourself after your procedure. Your doctor may also give you more specific instructions. If you have problems or questions, contact your doctor. Follow these instructions at home:   Take over-the-counter and prescription medicines only as told by your doctor.  Go back to your normal activities as told by your doctor.  Follow instructions from your doctor about how to take care of the area where an IV tube was put into your vein (insertion site). Make sure you: ? Wash your hands with soap and water before you change your bandage (dressing). If there is no soap and water, use hand sanitizer. ? Change your bandage as told by your doctor.  Check your IV insertion site every day for signs of infection. Check for: ? More redness, swelling, or pain. ? More fluid or blood. ? Warmth. ? Pus or a bad smell. Contact a doctor if:  You have more redness, swelling, or pain around the IV insertion site.  You have more fluid or blood coming from the IV insertion site.  Your IV insertion site feels warm to the touch.  You have pus or a bad smell coming from the IV insertion site.  Your pee (urine) turns pink, red, or brown.  You feel weak after doing your normal activities. Get help right away if:  You have signs of a serious allergic or body defense (immune) system reaction, including: ? Itchiness. ? Hives. ? Trouble breathing. ? Anxiety. ? Pain in your chest or lower back. ? Fever, flushing, and chills. ? Fast pulse. ? Rash. ? Watery poop (diarrhea). ? Throwing up (vomiting). ? Dark pee. ? Serious headache. ? Dizziness. ? Stiff neck. ? Yellow color in your face or the white parts of your eyes (jaundice). Summary  After a blood transfusion, return to your normal activities as told by your doctor.  Every day, check for signs of infection where the IV tube was put into your vein.  Some  signs of infection are warm skin, more redness and pain, more fluid or blood, and pus or a bad smell where the needle went in.  Contact your doctor if you feel weak or have any unusual symptoms. This information is not intended to replace advice given to you by your health care provider. Make sure you discuss any questions you have with your health care provider. Document Released: 05/31/2014 Document Revised: 01/02/2016 Document Reviewed: 01/02/2016 Elsevier Interactive Patient Education  2019 [employer] Inc.  

## 2018-08-03 NOTE — Progress Notes (Signed)
PATIENT CARE CENTER NOTE  Diagnosis: Anemia associated with stage 4 chronic renal failure ([place])  Provider: [name], MD   Procedure: 2 units PRBC's   Note: Patient received 2 units of blood. Pre-medications given per order. Tolerated transfusion well with no adverse reaction. Vital signs stable. Discharge instructions given. Patient alert, oriented and ambulatory at discharge.

## 2018-08-04 ENCOUNTER — Encounter (HOSPITAL_COMMUNITY): Payer: Medicare Other

## 2018-08-04 LAB — TYPE AND SCREEN
ABO/RH(D): O NEG
Antibody Screen: NEGATIVE
Donor AG Type: NEGATIVE
Donor AG Type: NEGATIVE
Unit division: 0
Unit division: 0

## 2018-08-04 LAB — BPAM RBC
Blood Product Expiration Date: 202003182359
Blood Product Expiration Date: 202004062359
ISSUE DATE / TIME: 202003120959
ISSUE DATE / TIME: 202003120959
UNIT TYPE AND RH: 9500
Unit Type and Rh: 9500

## 2018-08-16 ENCOUNTER — Inpatient Hospital Stay: Payer: Medicare Other

## 2018-08-16 ENCOUNTER — Other Ambulatory Visit: Payer: Self-pay

## 2018-08-16 ENCOUNTER — Telehealth: Payer: Self-pay | Admitting: *Deleted

## 2018-08-16 ENCOUNTER — Other Ambulatory Visit: Payer: Self-pay | Admitting: *Deleted

## 2018-08-16 DIAGNOSIS — D649 Anemia, unspecified: Secondary | ICD-10-CM

## 2018-08-16 DIAGNOSIS — N184 Chronic kidney disease, stage 4 (severe): Secondary | ICD-10-CM | POA: Diagnosis not present

## 2018-08-16 DIAGNOSIS — D631 Anemia in chronic kidney disease: Secondary | ICD-10-CM

## 2018-08-16 LAB — CBC WITH DIFFERENTIAL (CANCER CENTER ONLY)
Abs Immature Granulocytes: 0.09 10*3/uL — ABNORMAL HIGH (ref 0.00–0.07)
Basophils Absolute: 0 10*3/uL (ref 0.0–0.1)
Basophils Relative: 1 %
Eosinophils Absolute: 0.1 10*3/uL (ref 0.0–0.5)
Eosinophils Relative: 3 %
HCT: 20.5 % — ABNORMAL LOW (ref 39.0–52.0)
Hemoglobin: 6.7 g/dL — CL (ref 13.0–17.0)
Immature Granulocytes: 2 %
Lymphocytes Relative: 24 %
Lymphs Abs: 1.3 10*3/uL (ref 0.7–4.0)
MCH: 29.3 pg (ref 26.0–34.0)
MCHC: 32.7 g/dL (ref 30.0–36.0)
MCV: 89.5 fL (ref 80.0–100.0)
Monocytes Absolute: 0.4 10*3/uL (ref 0.1–1.0)
Monocytes Relative: 7 %
NEUTROS PCT: 63 %
Neutro Abs: 3.5 10*3/uL (ref 1.7–7.7)
PLATELETS: 179 10*3/uL (ref 150–400)
RBC: 2.29 MIL/uL — AB (ref 4.22–5.81)
RDW: 14.5 % (ref 11.5–15.5)
WBC Count: 5.5 10*3/uL (ref 4.0–10.5)
nRBC: 0 % (ref 0.0–0.2)

## 2018-08-16 LAB — IRON AND TIBC
Iron: 217 ug/dL — ABNORMAL HIGH (ref 42–163)
SATURATION RATIOS: UNDETERMINED % (ref 20–55)
TIBC: 210 ug/dL (ref 202–409)
UIBC: UNDETERMINED ug/dL (ref 117–376)

## 2018-08-16 LAB — FERRITIN: FERRITIN: 1059 ng/mL — AB (ref 24–336)

## 2018-08-16 LAB — SAMPLE TO BLOOD BANK

## 2018-08-16 NOTE — Progress Notes (Signed)
Patient made aware that he will not receive blood transfusion today and that he will receive a call from scheduling for appointment on 4/1 to recheck labs and status for possible transfusion. Patient verbalized understanding.

## 2018-08-16 NOTE — Progress Notes (Signed)
Per Dr. [name], pt will not receive blood today with HGB of 6.7 and pt asymptomatic.  Per DR. [name], have pt return to office next week on April 1st to have labs drawn again with infusion apt to re access pt HGB. Scheduling message sent with instructions to call pt with exact date and time.

## 2018-08-16 NOTE — Telephone Encounter (Signed)
Marie MT call report of today's Hgb = 6.7.  Lab sending Type and Hold order.  Secure messaged collaborative with results.   Scheduled 9:00 am today for 5-hour infusion.

## 2018-08-21 ENCOUNTER — Telehealth: Payer: Self-pay | Admitting: Hematology and Oncology

## 2018-08-21 NOTE — Telephone Encounter (Signed)
Scheduled appt per 3/27 sch message - unable to reach patient - left message for patient with appt date and time

## 2018-08-23 ENCOUNTER — Inpatient Hospital Stay: Payer: Medicare Other

## 2018-08-23 ENCOUNTER — Inpatient Hospital Stay: Payer: Medicare Other | Attending: Hematology and Oncology

## 2018-08-23 DIAGNOSIS — D631 Anemia in chronic kidney disease: Secondary | ICD-10-CM | POA: Insufficient documentation

## 2018-08-23 DIAGNOSIS — N184 Chronic kidney disease, stage 4 (severe): Secondary | ICD-10-CM | POA: Insufficient documentation

## 2018-08-29 ENCOUNTER — Other Ambulatory Visit: Payer: Self-pay | Admitting: *Deleted

## 2018-08-29 ENCOUNTER — Telehealth: Payer: Self-pay | Admitting: *Deleted

## 2018-08-29 ENCOUNTER — Other Ambulatory Visit: Payer: Self-pay

## 2018-08-29 ENCOUNTER — Inpatient Hospital Stay: Payer: Medicare Other

## 2018-08-29 ENCOUNTER — Other Ambulatory Visit: Payer: Medicare Other

## 2018-08-29 VITALS — BP 152/80 | HR 68 | Temp 98.3°F | Resp 18

## 2018-08-29 DIAGNOSIS — D649 Anemia, unspecified: Secondary | ICD-10-CM

## 2018-08-29 DIAGNOSIS — D631 Anemia in chronic kidney disease: Secondary | ICD-10-CM

## 2018-08-29 DIAGNOSIS — N184 Chronic kidney disease, stage 4 (severe): Principal | ICD-10-CM

## 2018-08-29 LAB — SAMPLE TO BLOOD BANK

## 2018-08-29 LAB — HEMOGLOBIN AND HEMATOCRIT (CANCER CENTER ONLY)
HCT: 21.6 % — ABNORMAL LOW (ref 39.0–52.0)
Hemoglobin: 6.7 g/dL — CL (ref 13.0–17.0)

## 2018-08-29 LAB — BASIC METABOLIC PANEL - CANCER CENTER ONLY
Anion gap: 7 (ref 5–15)
BUN: 36 mg/dL — ABNORMAL HIGH (ref 6–20)
CO2: 23 mmol/L (ref 22–32)
Calcium: 8.6 mg/dL — ABNORMAL LOW (ref 8.9–10.3)
Chloride: 114 mmol/L — ABNORMAL HIGH (ref 98–111)
Creatinine: 3.62 mg/dL (ref 0.61–1.24)
GFR, Est AFR Am: 21 mL/min — ABNORMAL LOW (ref >60)
GFR, Estimated: 18 mL/min — ABNORMAL LOW (ref >60)
Glucose, Bld: 147 mg/dL — ABNORMAL HIGH (ref 70–99)
Potassium: 5.3 mmol/L — ABNORMAL HIGH (ref 3.5–5.1)
Sodium: 144 mmol/L (ref 135–145)

## 2018-08-29 LAB — CBC WITH DIFFERENTIAL (CANCER CENTER ONLY)
Abs Immature Granulocytes: 0.26 10*3/uL — ABNORMAL HIGH (ref 0.00–0.07)
Basophils Absolute: 0 10*3/uL (ref 0.0–0.1)
Basophils Relative: 0 %
Eosinophils Absolute: 0.2 10*3/uL (ref 0.0–0.5)
Eosinophils Relative: 4 %
HCT: 17.9 % — ABNORMAL LOW (ref 39.0–52.0)
Hemoglobin: 5.6 g/dL — CL (ref 13.0–17.0)
Immature Granulocytes: 5 %
Lymphocytes Relative: 24 %
Lymphs Abs: 1.3 10*3/uL (ref 0.7–4.0)
MCH: 29.2 pg (ref 26.0–34.0)
MCHC: 31.3 g/dL (ref 30.0–36.0)
MCV: 93.2 fL (ref 80.0–100.0)
Monocytes Absolute: 0.4 10*3/uL (ref 0.1–1.0)
Monocytes Relative: 7 %
Neutro Abs: 3.1 10*3/uL (ref 1.7–7.7)
Neutrophils Relative %: 60 %
Platelet Count: 167 10*3/uL (ref 150–400)
RBC: 1.92 MIL/uL — ABNORMAL LOW (ref 4.22–5.81)
RDW: 16.1 % — ABNORMAL HIGH (ref 11.5–15.5)
WBC Count: 5.2 10*3/uL (ref 4.0–10.5)
nRBC: 0 % (ref 0.0–0.2)

## 2018-08-29 LAB — FERRITIN: Ferritin: 997 ng/mL — ABNORMAL HIGH (ref 24–336)

## 2018-08-29 LAB — PREPARE RBC (CROSSMATCH)

## 2018-08-29 LAB — IRON AND TIBC
Iron: 191 ug/dL — ABNORMAL HIGH (ref 42–163)
Saturation Ratios: 99 % — ABNORMAL HIGH (ref 20–55)
TIBC: 193 ug/dL — ABNORMAL LOW (ref 202–409)
UIBC: 2 ug/dL — ABNORMAL LOW (ref 117–376)

## 2018-08-29 MED ORDER — DIPHENHYDRAMINE HCL 25 MG PO CAPS
25.0000 mg | ORAL_CAPSULE | Freq: Once | ORAL | Status: AC
  Administered 2018-08-29: 25 mg via ORAL

## 2018-08-29 MED ORDER — ACETAMINOPHEN 325 MG PO TABS
ORAL_TABLET | ORAL | Status: AC
  Filled 2018-08-29: qty 2

## 2018-08-29 MED ORDER — ACETAMINOPHEN 325 MG PO TABS
650.0000 mg | ORAL_TABLET | Freq: Once | ORAL | Status: AC
  Administered 2018-08-29: 650 mg via ORAL

## 2018-08-29 MED ORDER — SODIUM CHLORIDE 0.9% IV SOLUTION
250.0000 mL | Freq: Once | INTRAVENOUS | Status: AC
  Administered 2018-08-29: 250 mL via INTRAVENOUS
  Filled 2018-08-29: qty 250

## 2018-08-29 MED ORDER — DARBEPOETIN ALFA 500 MCG/ML IJ SOSY
500.0000 ug | PREFILLED_SYRINGE | Freq: Once | INTRAMUSCULAR | Status: AC
  Administered 2018-08-29: 500 ug via SUBCUTANEOUS

## 2018-08-29 MED ORDER — DIPHENHYDRAMINE HCL 25 MG PO CAPS
ORAL_CAPSULE | ORAL | Status: AC
  Filled 2018-08-29: qty 1

## 2018-08-29 MED ORDER — DARBEPOETIN ALFA 500 MCG/ML IJ SOSY
PREFILLED_SYRINGE | INTRAMUSCULAR | Status: AC
  Filled 2018-08-29: qty 1

## 2018-08-29 NOTE — Telephone Encounter (Signed)
Received call report from [facility]  "Today's Creat = 3.62."  Secure Chat sent to provider and collaborative nurse with results.

## 2018-08-29 NOTE — Patient Instructions (Signed)

## 2018-08-29 NOTE — Progress Notes (Signed)
Pt Hgb 5.6.  Per Dr. [name], transfuse 2 units PRBC's today.  Orders placed and infusion RN notified. Message sent to scheduling to schedule a follow up lab and infusion apt for in one month with instructions for them to call pt to inform him of date and time.

## 2018-08-30 LAB — TYPE AND SCREEN
ABO/RH(D): O NEG
Antibody Screen: NEGATIVE
Donor AG Type: NEGATIVE
Donor AG Type: NEGATIVE
Unit division: 0
Unit division: 0

## 2018-08-30 LAB — BPAM RBC
Blood Product Expiration Date: 202004242359
Blood Product Expiration Date: 202005052359
ISSUE DATE / TIME: 202004071215
ISSUE DATE / TIME: 202004071557
Unit Type and Rh: 9500
Unit Type and Rh: 9500

## 2018-09-04 ENCOUNTER — Telehealth: Payer: Self-pay | Admitting: Hematology and Oncology

## 2018-09-04 NOTE — Telephone Encounter (Signed)
Per 4/7 schedule message added lab/6hr infusion for 5/5. Spoke with patient. Also confirmed 4/21 appointments.

## 2018-09-11 NOTE — Progress Notes (Signed)
Patient Care Team: Rexene Agent, MD as PCP - General (Nephrology)  DIAGNOSIS:    ICD-10-CM   1. 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 with above-mentioned history of severe anemia requiring blood transfusions and history of stage 4 renal disease. He presents to the clinic alone today.  He reports that he is feeling a lot better. his energy levels are much improved he denies any dizziness or lightheadedness.  REVIEW OF SYSTEMS:   Constitutional: Denies fevers, chills or abnormal weight loss Eyes: Denies blurriness of vision Ears, nose, mouth, throat, and face: Denies mucositis or sore throat Respiratory: Denies cough, dyspnea or wheezes Cardiovascular: Denies palpitation, chest discomfort Gastrointestinal: Denies nausea, heartburn or change in bowel habits Skin: Denies abnormal skin rashes Lymphatics: Denies new lymphadenopathy or easy bruising Neurological: Denies numbness, tingling or new weaknesses Behavioral/Psych: Mood is stable, no new changes  Extremities: No lower extremity edema Breast: denies any pain or lumps or nodules in either breasts All other systems were reviewed with the patient and are negative.  I have reviewed the past medical history, past surgical history, social history and family history with the patient and they are unchanged from previous note.  ALLERGIES:  has No Known Allergies.  MEDICATIONS:  Current Outpatient Medications  Medication Sig Dispense Refill  . acetaminophen (TYLENOL) 500 MG tablet Take 1,000-1,500 mg by mouth 2 (two) times daily as needed for moderate pain.    . calcitRIOL (ROCALTROL) 0.5 MCG capsule Take 0.5 mcg by mouth 2 (two) times daily.    . calcium carbonate (TUMS - DOSED IN MG ELEMENTAL CALCIUM) 500 MG chewable tablet Chew 1 tablet by mouth daily.    . cinacalcet  (SENSIPAR) 30 MG tablet Take 30 mg by mouth daily. With the largest meal of the day    . colchicine 0.6 MG tablet Take 0.6-1.2 mg by mouth daily as needed (for gout flare-ups).    . doxazosin (CARDURA) 8 MG tablet Take 8 mg by mouth every evening.    . famotidine (PEPCID) 20 MG tablet Take 20 mg by mouth every evening.     . finasteride (PROSCAR) 5 MG tablet Take 5 mg by mouth daily.    . furosemide (LASIX) 80 MG tablet Take 80 mg by mouth daily.    Marland Kitchen loperamide (IMODIUM) 2 MG capsule Take 1 capsule (2 mg total) by mouth as needed for diarrhea or loose stools. 30 capsule 0  . mycophenolate (CELLCEPT) 250 MG capsule Take 1,000 mg by mouth 2 (two) times daily.    . potassium chloride SA (K-DUR,KLOR-CON) 20 MEQ tablet Take 20 mEq by mouth 2 (two) times daily.    . predniSONE (DELTASONE) 5 MG tablet Take 1 tablet (5 mg total) by mouth daily with breakfast. 30 tablet 0  . tacrolimus (PROGRAF) 1 MG capsule Take 3 mg by mouth 2 (two) times daily.     . tacrolimus (PROGRAF) 5 MG capsule Take 5 mg by mouth 2 (two) times daily.      No current facility-administered medications for this visit.     PHYSICAL EXAMINATION: ECOG PERFORMANCE STATUS: 1 - Symptomatic but completely ambulatory  Vitals:   09/12/18 1027  BP: (!) 152/77  Pulse: 64  Resp: 18  Temp: 98.1 F (36.7 C)  SpO2: 100%   Filed Weights   09/12/18 1027  Weight: 163 lb 1.6 oz (74 kg)    GENERAL: alert, no distress and comfortable SKIN: skin color, texture, turgor are normal, no rashes or significant lesions EYES: normal, Conjunctiva are pink and non-injected, sclera clear OROPHARYNX: no exudate, no erythema and lips, buccal mucosa, and tongue normal  NECK: supple, thyroid normal size, non-tender, without nodularity LYMPH: no palpable lymphadenopathy in the cervical, axillary or inguinal LUNGS: clear to auscultation and percussion with normal breathing effort HEART: regular rate & rhythm and no murmurs and no lower extremity  edema ABDOMEN: abdomen soft, non-tender and normal bowel sounds MUSCULOSKELETAL: no cyanosis of digits and no clubbing  NEURO: alert & oriented x 3 with fluent speech, no focal motor/sensory deficits EXTREMITIES: No lower extremity edema  LABORATORY DATA:  I have reviewed the data as listed CMP Latest Ref Rng & Units 08/29/2018 08/01/2018 07/14/2018  Glucose 70 - 99 mg/dL 147(H) 155(H) 154(H)  BUN 6 - 20 mg/dL 36(H) 46(H) 35(H)  Creatinine 0.61 - 1.24 mg/dL 3.62(HH) 3.33(HH) 3.34(HH)  Sodium 135 - 145 mmol/L 144 142 139  Potassium 3.5 - 5.1 mmol/L 5.3(H) 3.8 3.8  Chloride 98 - 111 mmol/L 114(H) 111 114(H)  CO2 22 - 32 mmol/L 23 19(L) 14(L)  Calcium 8.9 - 10.3 mg/dL 8.6(L) 8.1(L) 7.8(L)  Total Protein 6.5 - 8.1 g/dL - 6.5 6.4(L)  Total Bilirubin 0.3 - 1.2 mg/dL - 0.4 0.3  Alkaline Phos 38 - 126 U/L - 91 86  AST 15 - 41 U/L - 11(L) 9(L)  ALT 0 - 44 U/L - 8 <6    Lab Results  Component Value Date   WBC 4.6 09/12/2018   HGB 8.5 (L) 09/12/2018   HCT 26.2 (L) 09/12/2018   MCV 92.9 09/12/2018   PLT 229 09/12/2018   NEUTROABS 2.8 09/12/2018    ASSESSMENT & PLAN:  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  Treatment plan: 1.Transfusions every 2 weeks as needed 2.Aranesp injections 500 micrograms every 4 weeks  Lab review:  Hemoglobin is 8.5 Based on this hemoglobin level we decided not to give blood transfusion.  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  I discussed with the patient about starting iron chelation with Exjade.  We will wait and see where the ferritin goes before making a final decision on this.   Return to clinic in 2 weeks for labs and follow-up and we will book him for blood transfusion if needed.  No orders of the defined types were placed in this encounter.  The patient has a good understanding of the overall plan. he agrees with it. he will call with any problems that may develop before the next visit here.  [name], MD 09/12/2018  Julious Oka Dorshimer am acting as scribe for Dr. [name].  I have reviewed the above documentation for accuracy and completeness, and I agree with the above.

## 2018-09-12 ENCOUNTER — Inpatient Hospital Stay: Payer: Medicare Other

## 2018-09-12 ENCOUNTER — Other Ambulatory Visit: Payer: Medicare Other

## 2018-09-12 ENCOUNTER — Inpatient Hospital Stay (HOSPITAL_BASED_OUTPATIENT_CLINIC_OR_DEPARTMENT_OTHER): Payer: Medicare Other | Admitting: Hematology and Oncology

## 2018-09-12 ENCOUNTER — Other Ambulatory Visit: Payer: Self-pay

## 2018-09-12 DIAGNOSIS — N184 Chronic kidney disease, stage 4 (severe): Secondary | ICD-10-CM

## 2018-09-12 DIAGNOSIS — Z940 Kidney transplant status: Secondary | ICD-10-CM

## 2018-09-12 DIAGNOSIS — D631 Anemia in chronic kidney disease: Secondary | ICD-10-CM | POA: Diagnosis not present

## 2018-09-12 DIAGNOSIS — D649 Anemia, unspecified: Secondary | ICD-10-CM

## 2018-09-12 LAB — COMPREHENSIVE METABOLIC PANEL
ALT: 7 U/L (ref 0–44)
AST: 11 U/L — ABNORMAL LOW (ref 15–41)
Albumin: 3.8 g/dL (ref 3.5–5.0)
Alkaline Phosphatase: 76 U/L (ref 38–126)
Anion gap: 10 (ref 5–15)
BUN: 44 mg/dL — ABNORMAL HIGH (ref 6–20)
CO2: 20 mmol/L — ABNORMAL LOW (ref 22–32)
Calcium: 7.6 mg/dL — ABNORMAL LOW (ref 8.9–10.3)
Chloride: 113 mmol/L — ABNORMAL HIGH (ref 98–111)
Creatinine, Ser: 3.35 mg/dL (ref 0.61–1.24)
GFR calc Af Amer: 23 mL/min — ABNORMAL LOW (ref >60)
GFR calc non Af Amer: 20 mL/min — ABNORMAL LOW (ref >60)
Glucose, Bld: 148 mg/dL — ABNORMAL HIGH (ref 70–99)
Potassium: 4.6 mmol/L (ref 3.5–5.1)
Sodium: 143 mmol/L (ref 135–145)
Total Bilirubin: 0.5 mg/dL (ref 0.3–1.2)
Total Protein: 6.5 g/dL (ref 6.5–8.1)

## 2018-09-12 LAB — CBC WITH DIFFERENTIAL (CANCER CENTER ONLY)
Abs Immature Granulocytes: 0.12 10*3/uL — ABNORMAL HIGH (ref 0.00–0.07)
Basophils Absolute: 0 10*3/uL (ref 0.0–0.1)
Basophils Relative: 1 %
Eosinophils Absolute: 0.2 10*3/uL (ref 0.0–0.5)
Eosinophils Relative: 4 %
HCT: 26.2 % — ABNORMAL LOW (ref 39.0–52.0)
Hemoglobin: 8.5 g/dL — ABNORMAL LOW (ref 13.0–17.0)
Immature Granulocytes: 3 %
Lymphocytes Relative: 23 %
Lymphs Abs: 1 10*3/uL (ref 0.7–4.0)
MCH: 30.1 pg (ref 26.0–34.0)
MCHC: 32.4 g/dL (ref 30.0–36.0)
MCV: 92.9 fL (ref 80.0–100.0)
Monocytes Absolute: 0.4 10*3/uL (ref 0.1–1.0)
Monocytes Relative: 8 %
Neutro Abs: 2.8 10*3/uL (ref 1.7–7.7)
Neutrophils Relative %: 61 %
Platelet Count: 229 10*3/uL (ref 150–400)
RBC: 2.82 MIL/uL — ABNORMAL LOW (ref 4.22–5.81)
RDW: 15.6 % — ABNORMAL HIGH (ref 11.5–15.5)
WBC Count: 4.6 10*3/uL (ref 4.0–10.5)
nRBC: 0 % (ref 0.0–0.2)

## 2018-09-12 LAB — SAMPLE TO BLOOD BANK

## 2018-09-12 LAB — IRON AND TIBC
Iron: 51 ug/dL (ref 42–163)
Saturation Ratios: 24 % (ref 20–55)
TIBC: 214 ug/dL (ref 202–409)
UIBC: 163 ug/dL (ref 117–376)

## 2018-09-12 LAB — FERRITIN: Ferritin: 661 ng/mL — ABNORMAL HIGH (ref 24–336)

## 2018-09-12 NOTE — Assessment & Plan Note (Signed)
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  I discussed with the patient about starting iron chelation with Exjade.

## 2018-09-21 NOTE — Assessment & Plan Note (Signed)
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  Treatment plan: 1.Transfusions every 2 weeks as needed 2.Aranesp injections 500 micrograms every 4 weeks  Lab review: Hemoglobin is  Based on this hemoglobin level we decided not to give blood transfusion.  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

## 2018-09-25 NOTE — Progress Notes (Signed)
Patient Care Team: Rexene Agent, MD as PCP - General (Nephrology)  DIAGNOSIS:    ICD-10-CM   1. 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 with above-mentioned history of severe anemia requiring bloodtransfusions and history of stage 4 renal disease. He currently receives Aranesp injection every 4 weeks. He presents to the clinicalonetoday to see if he needs blood transfusion.  He reports to be feeling mild to moderate fatigue.  Denies any lightheadedness or dizziness.  Does have shortness of breath to exertion.  REVIEW OF SYSTEMS:   Constitutional: Denies fevers, chills or abnormal weight loss Eyes: Denies blurriness of vision Ears, nose, mouth, throat, and face: Denies mucositis or sore throat Respiratory: Mild shortness of breath exertion Cardiovascular: Denies palpitation, chest discomfort Gastrointestinal: Denies nausea, heartburn or change in bowel habits Skin: Denies abnormal skin rashes Lymphatics: Denies new lymphadenopathy or easy bruising Neurological: Denies numbness, tingling or new weaknesses Behavioral/Psych: Mood is stable, no new changes  Extremities: No lower extremity edema All other systems were reviewed with the patient and are negative.  I have reviewed the past medical history, past surgical history, social history and family history with the patient and they are unchanged from previous note.  ALLERGIES:  has No Known Allergies.  MEDICATIONS:  Current Outpatient Medications  Medication Sig Dispense Refill  . acetaminophen (TYLENOL) 500 MG tablet Take 1,000-1,500 mg by mouth 2 (two) times daily as needed for moderate pain.    . calcitRIOL (ROCALTROL) 0.5 MCG capsule Take 0.5 mcg by mouth  2 (two) times daily.    . calcium carbonate (TUMS - DOSED IN MG ELEMENTAL CALCIUM) 500 MG chewable tablet Chew 1 tablet by mouth daily.    . cinacalcet (SENSIPAR) 30 MG tablet Take 30 mg by mouth daily. With the largest meal of the day    . colchicine 0.6 MG tablet Take 0.6-1.2 mg by mouth daily as needed (for gout flare-ups).    . doxazosin (CARDURA) 8 MG tablet Take 8 mg by mouth every evening.    . famotidine (PEPCID) 20 MG tablet Take 20 mg by mouth every evening.     . finasteride (PROSCAR) 5 MG tablet Take 5 mg by mouth daily.    . furosemide (LASIX) 80 MG tablet Take 80 mg by mouth daily.    Marland Kitchen loperamide (IMODIUM) 2 MG capsule Take 1 capsule (2 mg total) by mouth as needed for diarrhea or loose stools. 30 capsule 0  . mycophenolate (CELLCEPT) 250 MG capsule Take 1,000 mg by mouth 2 (two) times daily.    . potassium chloride SA (K-DUR,KLOR-CON) 20 MEQ tablet Take 20 mEq by mouth 2 (two) times daily.    . predniSONE (DELTASONE) 5 MG tablet Take 1 tablet (5 mg total) by mouth daily with breakfast. 30 tablet 0  . tacrolimus (PROGRAF) 1 MG capsule Take 3 mg by mouth 2 (two) times daily.     . tacrolimus (PROGRAF) 5 MG capsule Take 5 mg by mouth 2 (two) times daily.      No current facility-administered medications for this visit.     PHYSICAL EXAMINATION: ECOG PERFORMANCE STATUS: 1 - Symptomatic but completely ambulatory  Vitals:   09/26/18 0841  BP: (!) 156/88  Pulse: (!) 58  Resp: 18  Temp: 98.3 F (36.8 C)  SpO2: 100%   Filed Weights   09/26/18 0841  Weight: 168 lb 4.8 oz (76.3 kg)    GENERAL: alert, no distress and comfortable SKIN: skin color, texture, turgor are normal, no rashes or significant lesions EYES: normal, Conjunctiva are pink and non-injected, sclera clear OROPHARYNX: no exudate, no erythema and lips, buccal mucosa, and tongue normal  NECK: supple, thyroid normal size, non-tender, without nodularity LYMPH: no palpable lymphadenopathy in the cervical,  axillary or inguinal LUNGS: clear to auscultation and percussion with normal breathing effort HEART: regular rate & rhythm and no murmurs and no lower extremity edema ABDOMEN: abdomen soft, non-tender and normal bowel sounds MUSCULOSKELETAL: no cyanosis of digits and no clubbing  NEURO: alert & oriented x 3 with fluent speech, no focal motor/sensory deficits EXTREMITIES: No lower extremity edema  LABORATORY DATA:  I have reviewed the data as listed CMP Latest Ref Rng & Units 09/12/2018 08/29/2018 08/01/2018  Glucose 70 - 99 mg/dL 148(H) 147(H) 155(H)  BUN 6 - 20 mg/dL 44(H) 36(H) 46(H)  Creatinine 0.61 - 1.24 mg/dL 3.35(HH) 3.62(HH) 3.33(HH)  Sodium 135 - 145 mmol/L 143 144 142  Potassium 3.5 - 5.1 mmol/L 4.6 5.3(H) 3.8  Chloride 98 - 111 mmol/L 113(H) 114(H) 111  CO2 22 - 32 mmol/L 20(L) 23 19(L)  Calcium 8.9 - 10.3 mg/dL 7.6(L) 8.6(L) 8.1(L)  Total Protein 6.5 - 8.1 g/dL 6.5 - 6.5  Total Bilirubin 0.3 - 1.2 mg/dL 0.5 - 0.4  Alkaline Phos 38 - 126 U/L 76 - 91  AST 15 - 41 U/L 11(L) - 11(L)  ALT 0 - 44 U/L 7 - 8    Lab Results  Component Value Date   WBC 5.5 09/26/2018   HGB 8.4 (L) 09/26/2018   HCT 26.9 (L) 09/26/2018   MCV 91.8 09/26/2018   PLT 157 09/26/2018   NEUTROABS 3.1 09/26/2018    ASSESSMENT & PLAN:  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  Treatment plan: 1.Transfusions every 2 weeks as needed 2.Aranesp injections 500 micrograms every 4 weeks  Lab review: Hemoglobin is 8.4 g Based on this hemoglobin level we decided not to give blood transfusion.  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 We can continue to watch and monitor it. He requires less transfusions and we do not have to do much to lower his iron stores. Return to clinic in 1 month with labs follow-up and for injection.  Orders Placed This Encounter  Procedures  . Iron and TIBC    Standing Status:   Future    Standing Expiration Date:   09/26/2019  . Ferritin    Standing Status:   Future    Standing Expiration Date:   09/26/2019  . CBC with Differential (Cancer Center Only)    Standing Status:   Future    Standing Expiration Date:   09/26/2019  . Sample to Blood Bank    Standing Status:   Future    Standing Expiration Date:   09/26/2019   The patient has a good understanding of the overall plan. he agrees with it. he will call with any problems that may develop before  the next visit here.  [name], MD 09/26/2018  Julious Oka Dorshimer am acting as scribe for Dr. [name].  I have reviewed the above documentation for accuracy and completeness, and I agree with the above.

## 2018-09-26 ENCOUNTER — Inpatient Hospital Stay (HOSPITAL_BASED_OUTPATIENT_CLINIC_OR_DEPARTMENT_OTHER): Payer: Medicare Other | Admitting: Hematology and Oncology

## 2018-09-26 ENCOUNTER — Inpatient Hospital Stay: Payer: Medicare Other

## 2018-09-26 ENCOUNTER — Inpatient Hospital Stay: Payer: Medicare Other | Attending: Hematology and Oncology

## 2018-09-26 ENCOUNTER — Other Ambulatory Visit: Payer: Self-pay

## 2018-09-26 DIAGNOSIS — D631 Anemia in chronic kidney disease: Secondary | ICD-10-CM

## 2018-09-26 DIAGNOSIS — Z940 Kidney transplant status: Secondary | ICD-10-CM

## 2018-09-26 DIAGNOSIS — D649 Anemia, unspecified: Secondary | ICD-10-CM

## 2018-09-26 DIAGNOSIS — N184 Chronic kidney disease, stage 4 (severe): Secondary | ICD-10-CM | POA: Diagnosis present

## 2018-09-26 LAB — IRON AND TIBC
Iron: 73 ug/dL (ref 42–163)
Saturation Ratios: 34 % (ref 20–55)
TIBC: 214 ug/dL (ref 202–409)
UIBC: 141 ug/dL (ref 117–376)

## 2018-09-26 LAB — CBC WITH DIFFERENTIAL (CANCER CENTER ONLY)
Abs Immature Granulocytes: 0.11 10*3/uL — ABNORMAL HIGH (ref 0.00–0.07)
Basophils Absolute: 0 10*3/uL (ref 0.0–0.1)
Basophils Relative: 1 %
Eosinophils Absolute: 0.3 10*3/uL (ref 0.0–0.5)
Eosinophils Relative: 6 %
HCT: 26.9 % — ABNORMAL LOW (ref 39.0–52.0)
Hemoglobin: 8.4 g/dL — ABNORMAL LOW (ref 13.0–17.0)
Immature Granulocytes: 2 %
Lymphocytes Relative: 27 %
Lymphs Abs: 1.5 10*3/uL (ref 0.7–4.0)
MCH: 28.7 pg (ref 26.0–34.0)
MCHC: 31.2 g/dL (ref 30.0–36.0)
MCV: 91.8 fL (ref 80.0–100.0)
Monocytes Absolute: 0.4 10*3/uL (ref 0.1–1.0)
Monocytes Relative: 8 %
Neutro Abs: 3.1 10*3/uL (ref 1.7–7.7)
Neutrophils Relative %: 56 %
Platelet Count: 157 10*3/uL (ref 150–400)
RBC: 2.93 MIL/uL — ABNORMAL LOW (ref 4.22–5.81)
RDW: 14.6 % (ref 11.5–15.5)
WBC Count: 5.5 10*3/uL (ref 4.0–10.5)
nRBC: 0 % (ref 0.0–0.2)

## 2018-09-26 LAB — COMPREHENSIVE METABOLIC PANEL
ALT: 14 U/L (ref 0–44)
AST: 14 U/L — ABNORMAL LOW (ref 15–41)
Albumin: 3.9 g/dL (ref 3.5–5.0)
Alkaline Phosphatase: 90 U/L (ref 38–126)
Anion gap: 10 (ref 5–15)
BUN: 33 mg/dL — ABNORMAL HIGH (ref 6–20)
CO2: 22 mmol/L (ref 22–32)
Calcium: 8.3 mg/dL — ABNORMAL LOW (ref 8.9–10.3)
Chloride: 112 mmol/L — ABNORMAL HIGH (ref 98–111)
Creatinine, Ser: 3.42 mg/dL (ref 0.61–1.24)
GFR calc Af Amer: 22 mL/min — ABNORMAL LOW (ref >60)
GFR calc non Af Amer: 19 mL/min — ABNORMAL LOW (ref >60)
Glucose, Bld: 130 mg/dL — ABNORMAL HIGH (ref 70–99)
Potassium: 4.7 mmol/L (ref 3.5–5.1)
Sodium: 144 mmol/L (ref 135–145)
Total Bilirubin: 0.4 mg/dL (ref 0.3–1.2)
Total Protein: 6.6 g/dL (ref 6.5–8.1)

## 2018-09-26 LAB — SAMPLE TO BLOOD BANK

## 2018-09-26 LAB — FERRITIN: Ferritin: 864 ng/mL — ABNORMAL HIGH (ref 24–336)

## 2018-09-26 MED ORDER — DARBEPOETIN ALFA 500 MCG/ML IJ SOSY
PREFILLED_SYRINGE | INTRAMUSCULAR | Status: AC
  Filled 2018-09-26: qty 1

## 2018-09-26 MED ORDER — DARBEPOETIN ALFA 500 MCG/ML IJ SOSY
500.0000 ug | PREFILLED_SYRINGE | Freq: Once | INTRAMUSCULAR | Status: AC
  Administered 2018-09-26: 500 ug via SUBCUTANEOUS

## 2018-09-27 ENCOUNTER — Telehealth: Payer: Self-pay | Admitting: Hematology and Oncology

## 2018-09-27 NOTE — Telephone Encounter (Signed)
Called regarding schedule °

## 2018-10-18 NOTE — Assessment & Plan Note (Addendum)
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 We can continue to watch and monitor it.  Lab review:Hemoglobin is 8.4 g Based on this hemoglobin level we decided not to give blood transfusion.

## 2018-10-24 NOTE — Progress Notes (Signed)
Patient Care Team: Rexene Agent, MD as PCP - General (Nephrology)  DIAGNOSIS:    ICD-10-CM   1. 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 with above-mentioned history of severe anemia requiring bloodtransfusions and history of stage 4 renal disease. He currently receives Aranesp injection every 4 weeks. He presents to the clinicalonetoday to see if he needs blood transfusion and for Aranesp.   REVIEW OF SYSTEMS:   Constitutional: Denies fevers, chills or abnormal weight loss Eyes: Denies blurriness of vision Ears, nose, mouth, throat, and face: Denies mucositis or sore throat Respiratory: Denies cough, dyspnea or wheezes Cardiovascular: Denies palpitation, chest discomfort Gastrointestinal: Denies nausea, heartburn or change in bowel habits Skin: Denies abnormal skin rashes Lymphatics: Denies new lymphadenopathy or easy bruising Neurological: Denies numbness, tingling or new weaknesses Behavioral/Psych: Mood is stable, no new changes  Extremities: No lower extremity edema All other systems were reviewed with the patient and are negative.  I have reviewed the past medical history, past surgical history, social history and family history with the patient and they are unchanged from previous note.  ALLERGIES:  has No Known Allergies.  MEDICATIONS:  Current Outpatient Medications  Medication Sig Dispense Refill  . acetaminophen (TYLENOL) 500 MG tablet Take 1,000-1,500 mg by mouth 2 (two) times daily as needed for moderate pain.    . calcitRIOL (ROCALTROL) 0.5 MCG capsule Take 0.5 mcg by mouth 2 (two) times daily.    . calcium carbonate (TUMS - DOSED IN MG ELEMENTAL CALCIUM) 500 MG chewable tablet Chew 1 tablet by mouth daily.    . cinacalcet (SENSIPAR) 30 MG tablet Take 30 mg by mouth daily. With the  largest meal of the day    . colchicine 0.6 MG tablet Take 0.6-1.2 mg by mouth daily as needed (for gout flare-ups).    . doxazosin (CARDURA) 8 MG tablet Take 8 mg by mouth every evening.    . famotidine (PEPCID) 20 MG tablet Take 20 mg by mouth every evening.     . finasteride (PROSCAR) 5 MG tablet Take 5 mg by mouth daily.    . furosemide (LASIX) 80 MG tablet Take 80 mg by mouth daily.    Marland Kitchen loperamide (IMODIUM) 2 MG capsule Take 1 capsule (2 mg total) by mouth as needed for diarrhea or loose stools. 30 capsule 0  . mycophenolate (CELLCEPT) 250 MG capsule Take 1,000 mg by mouth 2 (two) times daily.    . potassium chloride SA (K-DUR,KLOR-CON) 20 MEQ tablet Take 20 mEq by mouth 2 (two) times daily.    . predniSONE (DELTASONE) 5 MG tablet Take 1 tablet (5 mg total) by mouth daily with breakfast. 30 tablet 0  . tacrolimus (PROGRAF) 1 MG capsule Take 3 mg by mouth 2 (two) times daily.     . tacrolimus (PROGRAF) 5 MG capsule Take 5 mg by mouth 2 (two) times daily.      No current facility-administered medications for this visit.     PHYSICAL EXAMINATION: ECOG PERFORMANCE STATUS: 1 - Symptomatic but completely ambulatory  Vitals:   10/25/18 0845  BP: (!) 163/85  Pulse: (!) 52  Resp: 18  Temp: 97.6 F (36.4 C)  SpO2: 100%   Filed Weights   10/25/18 0845  Weight: 170 lb 11.2 oz (77.4 kg)    Physical exam not  performed.  COVID-19 precautions  LABORATORY DATA:  I have reviewed the data as listed CMP Latest Ref Rng & Units 10/25/2018 09/26/2018 09/12/2018  Glucose 70 - 99 mg/dL 111(H) 130(H) 148(H)  BUN 6 - 20 mg/dL 44(H) 33(H) 44(H)  Creatinine 0.61 - 1.24 mg/dL 3.56(HH) 3.42(HH) 3.35(HH)  Sodium 135 - 145 mmol/L 142 144 143  Potassium 3.5 - 5.1 mmol/L 4.5 4.7 4.6  Chloride 98 - 111 mmol/L 109 112(H) 113(H)  CO2 22 - 32 mmol/L 20(L) 22 20(L)  Calcium 8.9 - 10.3 mg/dL 8.5(L) 8.3(L) 7.6(L)  Total Protein 6.5 - 8.1 g/dL 7.0 6.6 6.5  Total Bilirubin 0.3 - 1.2 mg/dL 0.4 0.4 0.5  Alkaline  Phos 38 - 126 U/L 98 90 76  AST 15 - 41 U/L 15 14(L) 11(L)  ALT 0 - 44 U/L 14 14 7     Lab Results  Component Value Date   WBC 6.5 10/25/2018   HGB 10.2 (L) 10/25/2018   HCT 32.5 (L) 10/25/2018   MCV 87.4 10/25/2018   PLT 191 10/25/2018   NEUTROABS 3.5 10/25/2018    ASSESSMENT & PLAN:  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 We can continue to watch and monitor it.  Lab review:6/3/2020hemoglobin is 10.2 g Based on this hemoglobin level we decided not to give blood transfusion. We will give him Aranesp today because his hemoglobin is just barely about 10.  However if next month his hemoglobin climbs any further we will hold off on Aranesp injections. It is remarkable that after such a long time his hemoglobin has finally stabilized.  No orders of the defined types were placed in this encounter.  The patient has a good understanding of the overall plan. he agrees with it. he will call with any problems that may develop before the next visit here.  [name], MD 10/25/2018  Julious Oka Dorshimer am acting as scribe for Dr. [name].  I have reviewed the above documentation for accuracy and completeness, and I agree with the above.

## 2018-10-25 ENCOUNTER — Other Ambulatory Visit: Payer: Self-pay

## 2018-10-25 ENCOUNTER — Inpatient Hospital Stay: Payer: Medicare Other

## 2018-10-25 ENCOUNTER — Inpatient Hospital Stay: Payer: Medicare Other | Attending: Hematology and Oncology

## 2018-10-25 ENCOUNTER — Inpatient Hospital Stay (HOSPITAL_BASED_OUTPATIENT_CLINIC_OR_DEPARTMENT_OTHER): Payer: Medicare Other | Admitting: Hematology and Oncology

## 2018-10-25 DIAGNOSIS — D631 Anemia in chronic kidney disease: Secondary | ICD-10-CM | POA: Diagnosis present

## 2018-10-25 DIAGNOSIS — D649 Anemia, unspecified: Secondary | ICD-10-CM

## 2018-10-25 DIAGNOSIS — Z79899 Other long term (current) drug therapy: Secondary | ICD-10-CM | POA: Diagnosis not present

## 2018-10-25 DIAGNOSIS — N184 Chronic kidney disease, stage 4 (severe): Secondary | ICD-10-CM

## 2018-10-25 DIAGNOSIS — Z940 Kidney transplant status: Secondary | ICD-10-CM

## 2018-10-25 LAB — CBC WITH DIFFERENTIAL (CANCER CENTER ONLY)
Abs Immature Granulocytes: 0.01 10*3/uL (ref 0.00–0.07)
Basophils Absolute: 0 10*3/uL (ref 0.0–0.1)
Basophils Relative: 1 %
Eosinophils Absolute: 0.3 10*3/uL (ref 0.0–0.5)
Eosinophils Relative: 5 %
HCT: 32.5 % — ABNORMAL LOW (ref 39.0–52.0)
Hemoglobin: 10.2 g/dL — ABNORMAL LOW (ref 13.0–17.0)
Immature Granulocytes: 0 %
Lymphocytes Relative: 33 %
Lymphs Abs: 2.1 10*3/uL (ref 0.7–4.0)
MCH: 27.4 pg (ref 26.0–34.0)
MCHC: 31.4 g/dL (ref 30.0–36.0)
MCV: 87.4 fL (ref 80.0–100.0)
Monocytes Absolute: 0.5 10*3/uL (ref 0.1–1.0)
Monocytes Relative: 7 %
Neutro Abs: 3.5 10*3/uL (ref 1.7–7.7)
Neutrophils Relative %: 54 %
Platelet Count: 191 10*3/uL (ref 150–400)
RBC: 3.72 MIL/uL — ABNORMAL LOW (ref 4.22–5.81)
RDW: 13.8 % (ref 11.5–15.5)
WBC Count: 6.5 10*3/uL (ref 4.0–10.5)
nRBC: 0 % (ref 0.0–0.2)

## 2018-10-25 LAB — COMPREHENSIVE METABOLIC PANEL
ALT: 14 U/L (ref 0–44)
AST: 15 U/L (ref 15–41)
Albumin: 4.1 g/dL (ref 3.5–5.0)
Alkaline Phosphatase: 98 U/L (ref 38–126)
Anion gap: 13 (ref 5–15)
BUN: 44 mg/dL — ABNORMAL HIGH (ref 6–20)
CO2: 20 mmol/L — ABNORMAL LOW (ref 22–32)
Calcium: 8.5 mg/dL — ABNORMAL LOW (ref 8.9–10.3)
Chloride: 109 mmol/L (ref 98–111)
Creatinine, Ser: 3.56 mg/dL (ref 0.61–1.24)
GFR calc Af Amer: 21 mL/min — ABNORMAL LOW (ref >60)
GFR calc non Af Amer: 18 mL/min — ABNORMAL LOW (ref >60)
Glucose, Bld: 111 mg/dL — ABNORMAL HIGH (ref 70–99)
Potassium: 4.5 mmol/L (ref 3.5–5.1)
Sodium: 142 mmol/L (ref 135–145)
Total Bilirubin: 0.4 mg/dL (ref 0.3–1.2)
Total Protein: 7 g/dL (ref 6.5–8.1)

## 2018-10-25 LAB — IRON AND TIBC
Iron: 48 ug/dL (ref 42–163)
Saturation Ratios: 21 % (ref 20–55)
TIBC: 225 ug/dL (ref 202–409)
UIBC: 177 ug/dL (ref 117–376)

## 2018-10-25 LAB — SAMPLE TO BLOOD BANK

## 2018-10-25 LAB — FERRITIN: Ferritin: 571 ng/mL — ABNORMAL HIGH (ref 24–336)

## 2018-10-25 MED ORDER — DARBEPOETIN ALFA 500 MCG/ML IJ SOSY
PREFILLED_SYRINGE | INTRAMUSCULAR | Status: AC
  Filled 2018-10-25: qty 1

## 2018-10-25 MED ORDER — DARBEPOETIN ALFA 500 MCG/ML IJ SOSY
500.0000 ug | PREFILLED_SYRINGE | Freq: Once | INTRAMUSCULAR | Status: AC
  Administered 2018-10-25: 500 ug via SUBCUTANEOUS

## 2018-10-25 NOTE — Patient Instructions (Signed)
Darbepoetin Alfa injection (Aranesp) What is this medicine? DARBEPOETIN ALFA (dar be POE e tin AL fa) helps your body make more red blood cells. It is used to treat anemia caused by chronic kidney failure and chemotherapy. This medicine may be used for other purposes; ask your health care provider or pharmacist if you have questions. COMMON BRAND NAME(S): Aranesp What should I tell my health care provider before I take this medicine? They need to know if you have any of these conditions: -blood clotting disorders or history of blood clots -cancer patient not on chemotherapy -cystic fibrosis -heart disease, such as angina, heart failure, or a history of a heart attack -hemoglobin level of 12 g/dL or greater -high blood pressure -low levels of folate, iron, or vitamin B12 -seizures -an unusual or allergic reaction to darbepoetin, erythropoietin, albumin, hamster proteins, latex, other medicines, foods, dyes, or preservatives -pregnant or trying to get pregnant -breast-feeding How should I use this medicine? This medicine is for injection into a vein or under the skin. It is usually given by a health care professional in a hospital or clinic setting. If you get this medicine at home, you will be taught how to prepare and give this medicine. Use exactly as directed. Take your medicine at regular intervals. Do not take your medicine more often than directed. It is important that you put your used needles and syringes in a special sharps container. Do not put them in a trash can. If you do not have a sharps container, call your pharmacist or healthcare provider to get one. A special MedGuide will be given to you by the pharmacist with each prescription and refill. Be sure to read this information carefully each time. Talk to your pediatrician regarding the use of this medicine in children. While this medicine may be used in children as young as 1 month of age for selected conditions, precautions do  apply. Overdosage: If you think you have taken too much of this medicine contact a poison control center or emergency room at once. NOTE: This medicine is only for you. Do not share this medicine with others. What if I miss a dose? If you miss a dose, take it as soon as you can. If it is almost time for your next dose, take only that dose. Do not take double or extra doses. What may interact with this medicine? Do not take this medicine with any of the following medications: -epoetin alfa This list may not describe all possible interactions. Give your health care provider a list of all the medicines, herbs, non-prescription drugs, or dietary supplements you use. Also tell them if you smoke, drink alcohol, or use illegal drugs. Some items may interact with your medicine. What should I watch for while using this medicine? Your condition will be monitored carefully while you are receiving this medicine. You may need blood work done while you are taking this medicine. This medicine may cause a decrease in vitamin B6. You should make sure that you get enough vitamin B6 while you are taking this medicine. Discuss the foods you eat and the vitamins you take with your health care professional. What side effects may I notice from receiving this medicine? Side effects that you should report to your doctor or health care professional as soon as possible: -allergic reactions like skin rash, itching or hives, swelling of the face, lips, or tongue -breathing problems -changes in vision -chest pain -confusion, trouble speaking or understanding -feeling faint or lightheaded, falls -high   blood pressure -muscle aches or pains -pain, swelling, warmth in the leg -rapid weight gain -severe headaches -sudden numbness or weakness of the face, arm or leg -trouble walking, dizziness, loss of balance or coordination -seizures (convulsions) -swelling of the ankles, feet, hands -unusually weak or tired Side  effects that usually do not require medical attention (report to your doctor or health care professional if they continue or are bothersome): -diarrhea -fever, chills (flu-like symptoms) -headaches -nausea, vomiting -redness, stinging, or swelling at site where injected This list may not describe all possible side effects. Call your doctor for medical advice about side effects. You may report side effects to FDA at 1-800-FDA-1088. Where should I keep my medicine? Keep out of the reach of children. Store in a refrigerator between 2 and 8 degrees C (36 and 46 degrees F). Do not freeze. Do not shake. Throw away any unused portion if using a single-dose vial. Throw away any unused medicine after the expiration date. NOTE: This sheet is a summary. It may not cover all possible information. If you have questions about this medicine, talk to your doctor, pharmacist, or health care provider.  2019 Elsevier/Gold Standard (2017-05-25 16:44:20)  

## 2018-10-25 NOTE — Progress Notes (Signed)
Per Dr. [name] okay for Aranesp injection with Hgb level of 10.2.

## 2018-10-26 ENCOUNTER — Telehealth: Payer: Self-pay | Admitting: Hematology and Oncology

## 2018-10-26 NOTE — Telephone Encounter (Signed)
Talk with patient regarding schedule °

## 2018-11-27 ENCOUNTER — Inpatient Hospital Stay: Payer: Medicare Other

## 2018-11-27 ENCOUNTER — Other Ambulatory Visit: Payer: Self-pay

## 2018-11-27 ENCOUNTER — Inpatient Hospital Stay: Payer: Medicare Other | Attending: Hematology and Oncology

## 2018-11-27 DIAGNOSIS — N184 Chronic kidney disease, stage 4 (severe): Secondary | ICD-10-CM | POA: Diagnosis not present

## 2018-11-27 DIAGNOSIS — D631 Anemia in chronic kidney disease: Secondary | ICD-10-CM | POA: Insufficient documentation

## 2018-11-27 LAB — CBC WITH DIFFERENTIAL (CANCER CENTER ONLY)
Abs Immature Granulocytes: 0.03 10*3/uL (ref 0.00–0.07)
Basophils Absolute: 0 10*3/uL (ref 0.0–0.1)
Basophils Relative: 0 %
Eosinophils Absolute: 0.4 10*3/uL (ref 0.0–0.5)
Eosinophils Relative: 7 %
HCT: 33.6 % — ABNORMAL LOW (ref 39.0–52.0)
Hemoglobin: 10.7 g/dL — ABNORMAL LOW (ref 13.0–17.0)
Immature Granulocytes: 1 %
Lymphocytes Relative: 36 %
Lymphs Abs: 1.9 10*3/uL (ref 0.7–4.0)
MCH: 26.4 pg (ref 26.0–34.0)
MCHC: 31.8 g/dL (ref 30.0–36.0)
MCV: 82.8 fL (ref 80.0–100.0)
Monocytes Absolute: 0.5 10*3/uL (ref 0.1–1.0)
Monocytes Relative: 10 %
Neutro Abs: 2.5 10*3/uL (ref 1.7–7.7)
Neutrophils Relative %: 46 %
Platelet Count: 156 10*3/uL (ref 150–400)
RBC: 4.06 MIL/uL — ABNORMAL LOW (ref 4.22–5.81)
RDW: 13.5 % (ref 11.5–15.5)
WBC Count: 5.3 10*3/uL (ref 4.0–10.5)
nRBC: 0 % (ref 0.0–0.2)

## 2018-11-27 LAB — IRON AND TIBC
Iron: 85 ug/dL (ref 42–163)
Saturation Ratios: 36 % (ref 20–55)
TIBC: 233 ug/dL (ref 202–409)
UIBC: 148 ug/dL (ref 117–376)

## 2018-11-27 LAB — FERRITIN: Ferritin: 633 ng/mL — ABNORMAL HIGH (ref 24–336)

## 2018-11-27 LAB — SAMPLE TO BLOOD BANK

## 2018-11-27 NOTE — Progress Notes (Signed)
HGB is 10.7 today no injection today per Doctor note on June 6th. Labs were given to pt

## 2018-12-21 NOTE — Assessment & Plan Note (Signed)
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 We can continue to watch and monitor it.  Lab review:6/3/2020hemoglobin is 10.2 g Lab review 12/29/2018:

## 2018-12-27 NOTE — Progress Notes (Signed)
Patient Care Team: Rexene Agent, MD as PCP - General (Nephrology)  DIAGNOSIS:    ICD-10-CM   1. 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 with above-mentioned history of severe anemia requiring bloodtransfusions and history of stage 4 renal disease.He currently receives Aranesp injection every 4 weeks.He presents to the clinicalonetoday for follow-up and Aranesp.   He says he feels stronger and has a lot more energy.  REVIEW OF SYSTEMS:   Constitutional: Denies fevers, chills or abnormal weight loss Eyes: Denies blurriness of vision Ears, nose, mouth, throat, and face: Denies mucositis or sore throat Respiratory: Denies cough, dyspnea or wheezes Cardiovascular: Denies palpitation, chest discomfort Gastrointestinal: Denies nausea, heartburn or change in bowel habits Skin: Denies abnormal skin rashes Lymphatics: Denies new lymphadenopathy or easy bruising Neurological: Denies numbness, tingling or new weaknesses Behavioral/Psych: Mood is stable, no new changes  Extremities: No lower extremity edema Breast: denies any pain or lumps or nodules in either breasts All other systems were reviewed with the patient and are negative.  I have reviewed the past medical history, past surgical history, social history and family history with the patient and they are unchanged from previous note.  ALLERGIES:  has No Known Allergies.  MEDICATIONS:  Current Outpatient Medications  Medication Sig Dispense Refill  . acetaminophen (TYLENOL) 500 MG tablet Take 1,000-1,500 mg by mouth 2 (two) times daily as needed for moderate pain.    . calcitRIOL (ROCALTROL) 0.5 MCG capsule Take 0.5 mcg by mouth 2 (two) times daily.    . calcium carbonate (TUMS - DOSED IN MG ELEMENTAL CALCIUM) 500 MG chewable tablet Chew 1 tablet by mouth daily.    .  cinacalcet (SENSIPAR) 30 MG tablet Take 30 mg by mouth daily. With the largest meal of the day    . colchicine 0.6 MG tablet Take 0.6-1.2 mg by mouth daily as needed (for gout flare-ups).    . doxazosin (CARDURA) 8 MG tablet Take 8 mg by mouth every evening.    . famotidine (PEPCID) 20 MG tablet Take 20 mg by mouth every evening.     . finasteride (PROSCAR) 5 MG tablet Take 5 mg by mouth daily.    . furosemide (LASIX) 80 MG tablet Take 80 mg by mouth daily.    Marland Kitchen loperamide (IMODIUM) 2 MG capsule Take 1 capsule (2 mg total) by mouth as needed for diarrhea or loose stools. 30 capsule 0  . mycophenolate (CELLCEPT) 250 MG capsule Take 1,000 mg by mouth 2 (two) times daily.    . potassium chloride SA (K-DUR,KLOR-CON) 20 MEQ tablet Take 20 mEq by mouth 2 (two) times daily.    . predniSONE (DELTASONE) 5 MG tablet Take 1 tablet (5 mg total) by mouth daily with breakfast. 30 tablet 0  . tacrolimus (PROGRAF) 1 MG capsule Take 3 mg by mouth 2 (two) times daily.     . tacrolimus (PROGRAF) 5 MG capsule Take 5 mg by mouth 2 (two) times daily.      No current facility-administered medications for this visit.     PHYSICAL EXAMINATION: ECOG PERFORMANCE STATUS: 1 - Symptomatic but completely ambulatory  Vitals:   12/28/18 1112  BP: (!) 159/89  Pulse: 76  Resp: 18  Temp: 97.8 F (36.6 C)  SpO2: 100%   Filed Weights   12/28/18 1112  Weight:  183 lb 1.6 oz (83.1 kg)    GENERAL: alert, no distress and comfortable SKIN: skin color, texture, turgor are normal, no rashes or significant lesions EYES: normal, Conjunctiva are pink and non-injected, sclera clear OROPHARYNX: no exudate, no erythema and lips, buccal mucosa, and tongue normal  NECK: supple, thyroid normal size, non-tender, without nodularity LYMPH: no palpable lymphadenopathy in the cervical, axillary or inguinal LUNGS: clear to auscultation and percussion with normal breathing effort HEART: regular rate & rhythm and no murmurs and no lower  extremity edema ABDOMEN: abdomen soft, non-tender and normal bowel sounds MUSCULOSKELETAL: no cyanosis of digits and no clubbing  NEURO: alert & oriented x 3 with fluent speech, no focal motor/sensory deficits EXTREMITIES: No lower extremity edema  LABORATORY DATA:  I have reviewed the data as listed CMP Latest Ref Rng & Units 10/25/2018 09/26/2018 09/12/2018  Glucose 70 - 99 mg/dL 111(H) 130(H) 148(H)  BUN 6 - 20 mg/dL 44(H) 33(H) 44(H)  Creatinine 0.61 - 1.24 mg/dL 3.56(HH) 3.42(HH) 3.35(HH)  Sodium 135 - 145 mmol/L 142 144 143  Potassium 3.5 - 5.1 mmol/L 4.5 4.7 4.6  Chloride 98 - 111 mmol/L 109 112(H) 113(H)  CO2 22 - 32 mmol/L 20(L) 22 20(L)  Calcium 8.9 - 10.3 mg/dL 8.5(L) 8.3(L) 7.6(L)  Total Protein 6.5 - 8.1 g/dL 7.0 6.6 6.5  Total Bilirubin 0.3 - 1.2 mg/dL 0.4 0.4 0.5  Alkaline Phos 38 - 126 U/L 98 90 76  AST 15 - 41 U/L 15 14(L) 11(L)  ALT 0 - 44 U/L 14 14 7     Lab Results  Component Value Date   WBC 4.9 12/28/2018   HGB 9.9 (L) 12/28/2018   HCT 30.8 (L) 12/28/2018   MCV 80.8 12/28/2018   PLT 155 12/28/2018   NEUTROABS 2.4 12/28/2018    ASSESSMENT & PLAN:  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  Treatment plan: 1.Transfusions every 2 weeks as needed: He has not needed blood transfusions since we started Aranesp. 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 We can continue to watch and monitor it.  Lab review:6/3/2020hemoglobin is 10.2 g Lab review 12/29/2018: Hemoglobin 9.9 He will receive Aranesp injection today. He  will come monthly for labs and Aranesp injections and I will see him back in 3 months.  No orders of the defined types were placed in this encounter.  The patient has a good understanding of the overall plan. he agrees with it. he will call with any problems that may develop before the next visit here.  [name], MD 12/28/2018  Julious Oka Dorshimer am acting as scribe for Dr. [name].  I have reviewed the above documentation for accuracy and completeness, and I agree with the above.

## 2018-12-28 ENCOUNTER — Other Ambulatory Visit: Payer: Self-pay

## 2018-12-28 ENCOUNTER — Telehealth: Payer: Self-pay | Admitting: Hematology and Oncology

## 2018-12-28 ENCOUNTER — Inpatient Hospital Stay: Payer: Medicare Other | Attending: Hematology and Oncology

## 2018-12-28 ENCOUNTER — Inpatient Hospital Stay (HOSPITAL_BASED_OUTPATIENT_CLINIC_OR_DEPARTMENT_OTHER): Payer: Medicare Other | Admitting: Hematology and Oncology

## 2018-12-28 ENCOUNTER — Inpatient Hospital Stay: Payer: Medicare Other

## 2018-12-28 DIAGNOSIS — D631 Anemia in chronic kidney disease: Secondary | ICD-10-CM | POA: Insufficient documentation

## 2018-12-28 DIAGNOSIS — N184 Chronic kidney disease, stage 4 (severe): Secondary | ICD-10-CM | POA: Insufficient documentation

## 2018-12-28 DIAGNOSIS — D649 Anemia, unspecified: Secondary | ICD-10-CM

## 2018-12-28 LAB — CBC WITH DIFFERENTIAL (CANCER CENTER ONLY)
Abs Immature Granulocytes: 0.05 10*3/uL (ref 0.00–0.07)
Basophils Absolute: 0 10*3/uL (ref 0.0–0.1)
Basophils Relative: 1 %
Eosinophils Absolute: 0.3 10*3/uL (ref 0.0–0.5)
Eosinophils Relative: 5 %
HCT: 30.8 % — ABNORMAL LOW (ref 39.0–52.0)
Hemoglobin: 9.9 g/dL — ABNORMAL LOW (ref 13.0–17.0)
Immature Granulocytes: 1 %
Lymphocytes Relative: 35 %
Lymphs Abs: 1.7 10*3/uL (ref 0.7–4.0)
MCH: 26 pg (ref 26.0–34.0)
MCHC: 32.1 g/dL (ref 30.0–36.0)
MCV: 80.8 fL (ref 80.0–100.0)
Monocytes Absolute: 0.4 10*3/uL (ref 0.1–1.0)
Monocytes Relative: 8 %
Neutro Abs: 2.4 10*3/uL (ref 1.7–7.7)
Neutrophils Relative %: 50 %
Platelet Count: 155 10*3/uL (ref 150–400)
RBC: 3.81 MIL/uL — ABNORMAL LOW (ref 4.22–5.81)
RDW: 14 % (ref 11.5–15.5)
WBC Count: 4.9 10*3/uL (ref 4.0–10.5)
nRBC: 0 % (ref 0.0–0.2)

## 2018-12-28 LAB — FERRITIN: Ferritin: 809 ng/mL — ABNORMAL HIGH (ref 24–336)

## 2018-12-28 LAB — IRON AND TIBC
Iron: 125 ug/dL (ref 42–163)
Saturation Ratios: 51 % (ref 20–55)
TIBC: 245 ug/dL (ref 202–409)
UIBC: 120 ug/dL (ref 117–376)

## 2018-12-28 LAB — SAMPLE TO BLOOD BANK

## 2018-12-28 MED ORDER — DARBEPOETIN ALFA 500 MCG/ML IJ SOSY
500.0000 ug | PREFILLED_SYRINGE | Freq: Once | INTRAMUSCULAR | Status: AC
  Administered 2018-12-28: 500 ug via SUBCUTANEOUS

## 2018-12-28 MED ORDER — DARBEPOETIN ALFA 500 MCG/ML IJ SOSY
PREFILLED_SYRINGE | INTRAMUSCULAR | Status: AC
  Filled 2018-12-28: qty 1

## 2018-12-28 NOTE — Telephone Encounter (Signed)
I talk with patient regarding schedule  

## 2019-01-26 ENCOUNTER — Telehealth: Payer: Self-pay | Admitting: Hematology and Oncology

## 2019-01-26 NOTE — Telephone Encounter (Signed)
Returned patient's phone call regarding rescheduling 09/08 appointments, per patient's request it has been rescheduled to 09/15.

## 2019-01-30 ENCOUNTER — Inpatient Hospital Stay: Payer: Medicare Other

## 2019-02-05 ENCOUNTER — Telehealth: Payer: Self-pay | Admitting: Hematology and Oncology

## 2019-02-05 NOTE — Telephone Encounter (Signed)
Returned patient's phone call regarding rescheduling 09/15 appointments due to not having transportation, per patient's request appointment has been moved to 09/23.

## 2019-02-06 ENCOUNTER — Ambulatory Visit: Payer: Medicare Other

## 2019-02-06 ENCOUNTER — Other Ambulatory Visit: Payer: Medicare Other

## 2019-02-14 ENCOUNTER — Encounter: Payer: Self-pay | Admitting: *Deleted

## 2019-02-14 ENCOUNTER — Other Ambulatory Visit: Payer: Self-pay

## 2019-02-14 ENCOUNTER — Inpatient Hospital Stay: Payer: Medicare Other | Attending: Hematology and Oncology

## 2019-02-14 ENCOUNTER — Inpatient Hospital Stay: Payer: Medicare Other

## 2019-02-14 VITALS — BP 156/78 | HR 72 | Temp 98.0°F | Resp 18

## 2019-02-14 DIAGNOSIS — D631 Anemia in chronic kidney disease: Secondary | ICD-10-CM | POA: Diagnosis present

## 2019-02-14 DIAGNOSIS — D649 Anemia, unspecified: Secondary | ICD-10-CM

## 2019-02-14 DIAGNOSIS — N184 Chronic kidney disease, stage 4 (severe): Secondary | ICD-10-CM | POA: Diagnosis not present

## 2019-02-14 DIAGNOSIS — Z940 Kidney transplant status: Secondary | ICD-10-CM

## 2019-02-14 LAB — COMPREHENSIVE METABOLIC PANEL
ALT: 15 U/L (ref 0–44)
AST: 13 U/L — ABNORMAL LOW (ref 15–41)
Albumin: 4.3 g/dL (ref 3.5–5.0)
Alkaline Phosphatase: 123 U/L (ref 38–126)
Anion gap: 12 (ref 5–15)
BUN: 38 mg/dL — ABNORMAL HIGH (ref 6–20)
CO2: 24 mmol/L (ref 22–32)
Calcium: 7.9 mg/dL — ABNORMAL LOW (ref 8.9–10.3)
Chloride: 104 mmol/L (ref 98–111)
Creatinine, Ser: 3.82 mg/dL (ref 0.61–1.24)
GFR calc Af Amer: 19 mL/min — ABNORMAL LOW (ref >60)
GFR calc non Af Amer: 17 mL/min — ABNORMAL LOW (ref >60)
Glucose, Bld: 322 mg/dL — ABNORMAL HIGH (ref 70–99)
Potassium: 3.2 mmol/L — ABNORMAL LOW (ref 3.5–5.1)
Sodium: 140 mmol/L (ref 135–145)
Total Bilirubin: 0.9 mg/dL (ref 0.3–1.2)
Total Protein: 6.9 g/dL (ref 6.5–8.1)

## 2019-02-14 LAB — CBC WITH DIFFERENTIAL (CANCER CENTER ONLY)
Abs Immature Granulocytes: 0.11 10*3/uL — ABNORMAL HIGH (ref 0.00–0.07)
Basophils Absolute: 0 10*3/uL (ref 0.0–0.1)
Basophils Relative: 0 %
Eosinophils Absolute: 0.1 10*3/uL (ref 0.0–0.5)
Eosinophils Relative: 2 %
HCT: 25.7 % — ABNORMAL LOW (ref 39.0–52.0)
Hemoglobin: 8.9 g/dL — ABNORMAL LOW (ref 13.0–17.0)
Immature Granulocytes: 2 %
Lymphocytes Relative: 33 %
Lymphs Abs: 2.3 10*3/uL (ref 0.7–4.0)
MCH: 26.9 pg (ref 26.0–34.0)
MCHC: 34.6 g/dL (ref 30.0–36.0)
MCV: 77.6 fL — ABNORMAL LOW (ref 80.0–100.0)
Monocytes Absolute: 0.7 10*3/uL (ref 0.1–1.0)
Monocytes Relative: 10 %
Neutro Abs: 3.7 10*3/uL (ref 1.7–7.7)
Neutrophils Relative %: 53 %
Platelet Count: 161 10*3/uL (ref 150–400)
RBC: 3.31 MIL/uL — ABNORMAL LOW (ref 4.22–5.81)
RDW: 13.9 % (ref 11.5–15.5)
WBC Count: 6.9 10*3/uL (ref 4.0–10.5)
nRBC: 0 % (ref 0.0–0.2)

## 2019-02-14 LAB — FERRITIN: Ferritin: 943 ng/mL — ABNORMAL HIGH (ref 24–336)

## 2019-02-14 LAB — SAMPLE TO BLOOD BANK

## 2019-02-14 LAB — IRON AND TIBC
Iron: 128 ug/dL (ref 42–163)
Saturation Ratios: 58 % — ABNORMAL HIGH (ref 20–55)
TIBC: 221 ug/dL (ref 202–409)
UIBC: 93 ug/dL — ABNORMAL LOW (ref 117–376)

## 2019-02-14 MED ORDER — DARBEPOETIN ALFA 500 MCG/ML IJ SOSY
500.0000 ug | PREFILLED_SYRINGE | Freq: Once | INTRAMUSCULAR | Status: AC
  Administered 2019-02-14: 10:00:00 500 ug via SUBCUTANEOUS

## 2019-02-14 MED ORDER — DARBEPOETIN ALFA 500 MCG/ML IJ SOSY
PREFILLED_SYRINGE | INTRAMUSCULAR | Status: AC
  Filled 2019-02-14: qty 1

## 2019-02-14 NOTE — Progress Notes (Signed)
CRITICAL VALUE ALERT  Critical Value:  crt 3.82  Date & Time Notied:  02/14/2019 1015  Provider Notified: [name], MD  Orders Received/Actions taken: Pt has stage 4 chronic renal failure

## 2019-02-14 NOTE — Patient Instructions (Signed)
Darbepoetin Alfa injection What is this medicine? DARBEPOETIN ALFA (dar be POE e tin AL fa) helps your body make more red blood cells. It is used to treat anemia caused by chronic kidney failure and chemotherapy. This medicine may be used for other purposes; ask your health care provider or pharmacist if you have questions. COMMON BRAND NAME(S): Aranesp What should I tell my health care provider before I take this medicine? They need to know if you have any of these conditions:  blood clotting disorders or history of blood clots  cancer patient not on chemotherapy  cystic fibrosis  heart disease, such as angina, heart failure, or a history of a heart attack  hemoglobin level of 12 g/dL or greater  high blood pressure  low levels of folate, iron, or vitamin B12  seizures  an unusual or allergic reaction to darbepoetin, erythropoietin, albumin, hamster proteins, latex, other medicines, foods, dyes, or preservatives  pregnant or trying to get pregnant  breast-feeding How should I use this medicine? This medicine is for injection into a vein or under the skin. It is usually given by a health care professional in a hospital or clinic setting. If you get this medicine at home, you will be taught how to prepare and give this medicine. Use exactly as directed. Take your medicine at regular intervals. Do not take your medicine more often than directed. It is important that you put your used needles and syringes in a special sharps container. Do not put them in a trash can. If you do not have a sharps container, call your pharmacist or healthcare provider to get one. A special MedGuide will be given to you by the pharmacist with each prescription and refill. Be sure to read this information carefully each time. Talk to your pediatrician regarding the use of this medicine in children. While this medicine may be used in children as young as 1 month of age for selected conditions, precautions do  apply. Overdosage: If you think you have taken too much of this medicine contact a poison control center or emergency room at once. NOTE: This medicine is only for you. Do not share this medicine with others. What if I miss a dose? If you miss a dose, take it as soon as you can. If it is almost time for your next dose, take only that dose. Do not take double or extra doses. What may interact with this medicine? Do not take this medicine with any of the following medications:  epoetin alfa This list may not describe all possible interactions. Give your health care provider a list of all the medicines, herbs, non-prescription drugs, or dietary supplements you use. Also tell them if you smoke, drink alcohol, or use illegal drugs. Some items may interact with your medicine. What should I watch for while using this medicine? Your condition will be monitored carefully while you are receiving this medicine. You may need blood work done while you are taking this medicine. This medicine may cause a decrease in vitamin B6. You should make sure that you get enough vitamin B6 while you are taking this medicine. Discuss the foods you eat and the vitamins you take with your health care professional. What side effects may I notice from receiving this medicine? Side effects that you should report to your doctor or health care professional as soon as possible:  allergic reactions like skin rash, itching or hives, swelling of the face, lips, or tongue  breathing problems  changes in   vision  chest pain  confusion, trouble speaking or understanding  feeling faint or lightheaded, falls  high blood pressure  muscle aches or pains  pain, swelling, warmth in the leg  rapid weight gain  severe headaches  sudden numbness or weakness of the face, arm or leg  trouble walking, dizziness, loss of balance or coordination  seizures (convulsions)  swelling of the ankles, feet, hands  unusually weak or  tired Side effects that usually do not require medical attention (report to your doctor or health care professional if they continue or are bothersome):  diarrhea  fever, chills (flu-like symptoms)  headaches  nausea, vomiting  redness, stinging, or swelling at site where injected This list may not describe all possible side effects. Call your doctor for medical advice about side effects. You may report side effects to FDA at 1-800-FDA-1088. Where should I keep my medicine? Keep out of the reach of children. Store in a refrigerator between 2 and 8 degrees C (36 and 46 degrees F). Do not freeze. Do not shake. Throw away any unused portion if using a single-dose vial. Throw away any unused medicine after the expiration date. NOTE: This sheet is a summary. It may not cover all possible information. If you have questions about this medicine, talk to your doctor, pharmacist, or health care provider.  2020 Elsevier/Gold Standard (2017-05-25 16:44:20)  

## 2019-02-26 ENCOUNTER — Telehealth: Payer: Self-pay | Admitting: Hematology and Oncology

## 2019-02-26 NOTE — Telephone Encounter (Signed)
R/s appt per 10/2 sch message - pt is aware of appt date and time

## 2019-03-01 ENCOUNTER — Other Ambulatory Visit: Payer: Medicare Other

## 2019-03-01 ENCOUNTER — Ambulatory Visit: Payer: Medicare Other

## 2019-03-14 ENCOUNTER — Inpatient Hospital Stay: Payer: Medicare Other | Attending: Hematology and Oncology

## 2019-03-14 ENCOUNTER — Telehealth: Payer: Self-pay | Admitting: Emergency Medicine

## 2019-03-14 ENCOUNTER — Inpatient Hospital Stay: Payer: Medicare Other

## 2019-03-14 ENCOUNTER — Other Ambulatory Visit: Payer: Self-pay

## 2019-03-14 DIAGNOSIS — N184 Chronic kidney disease, stage 4 (severe): Secondary | ICD-10-CM | POA: Insufficient documentation

## 2019-03-14 DIAGNOSIS — D631 Anemia in chronic kidney disease: Secondary | ICD-10-CM | POA: Diagnosis present

## 2019-03-14 DIAGNOSIS — Z940 Kidney transplant status: Secondary | ICD-10-CM

## 2019-03-14 DIAGNOSIS — D649 Anemia, unspecified: Secondary | ICD-10-CM

## 2019-03-14 LAB — CBC WITH DIFFERENTIAL (CANCER CENTER ONLY)
Abs Immature Granulocytes: 0.02 10*3/uL (ref 0.00–0.07)
Basophils Absolute: 0 10*3/uL (ref 0.0–0.1)
Basophils Relative: 1 %
Eosinophils Absolute: 0.1 10*3/uL (ref 0.0–0.5)
Eosinophils Relative: 3 %
HCT: 32 % — ABNORMAL LOW (ref 39.0–52.0)
Hemoglobin: 11 g/dL — ABNORMAL LOW (ref 13.0–17.0)
Immature Granulocytes: 0 %
Lymphocytes Relative: 39 %
Lymphs Abs: 2 10*3/uL (ref 0.7–4.0)
MCH: 26.9 pg (ref 26.0–34.0)
MCHC: 34.4 g/dL (ref 30.0–36.0)
MCV: 78.2 fL — ABNORMAL LOW (ref 80.0–100.0)
Monocytes Absolute: 0.4 10*3/uL (ref 0.1–1.0)
Monocytes Relative: 7 %
Neutro Abs: 2.5 10*3/uL (ref 1.7–7.7)
Neutrophils Relative %: 50 %
Platelet Count: 175 10*3/uL (ref 150–400)
RBC: 4.09 MIL/uL — ABNORMAL LOW (ref 4.22–5.81)
RDW: 14 % (ref 11.5–15.5)
WBC Count: 5 10*3/uL (ref 4.0–10.5)
nRBC: 0 % (ref 0.0–0.2)

## 2019-03-14 LAB — COMPREHENSIVE METABOLIC PANEL
ALT: 14 U/L (ref 0–44)
AST: 14 U/L — ABNORMAL LOW (ref 15–41)
Albumin: 4 g/dL (ref 3.5–5.0)
Alkaline Phosphatase: 150 U/L — ABNORMAL HIGH (ref 38–126)
Anion gap: 13 (ref 5–15)
BUN: 31 mg/dL — ABNORMAL HIGH (ref 6–20)
CO2: 22 mmol/L (ref 22–32)
Calcium: 8.5 mg/dL — ABNORMAL LOW (ref 8.9–10.3)
Chloride: 100 mmol/L (ref 98–111)
Creatinine, Ser: 4.03 mg/dL (ref 0.61–1.24)
GFR calc Af Amer: 18 mL/min — ABNORMAL LOW (ref >60)
GFR calc non Af Amer: 16 mL/min — ABNORMAL LOW (ref >60)
Glucose, Bld: 416 mg/dL — ABNORMAL HIGH (ref 70–99)
Potassium: 4 mmol/L (ref 3.5–5.1)
Sodium: 135 mmol/L (ref 135–145)
Total Bilirubin: 0.8 mg/dL (ref 0.3–1.2)
Total Protein: 6.6 g/dL (ref 6.5–8.1)

## 2019-03-14 LAB — SAMPLE TO BLOOD BANK

## 2019-03-14 LAB — IRON AND TIBC
Iron: 57 ug/dL (ref 42–163)
Saturation Ratios: 27 % (ref 20–55)
TIBC: 210 ug/dL (ref 202–409)
UIBC: 153 ug/dL (ref 117–376)

## 2019-03-14 LAB — FERRITIN: Ferritin: 980 ng/mL — ABNORMAL HIGH (ref 24–336)

## 2019-03-14 NOTE — Telephone Encounter (Signed)
Received critical lab value at 1110: Creatinine 4.03. Desk RN Merleen Nicely made aware.

## 2019-03-14 NOTE — Progress Notes (Signed)
Per MD [name] pt does not need Aranesp shot today d/t Hgb of 11.  Pt aware, denies needing anything else at this time.  Medicare form for transportation filled out per pt request by RN.

## 2019-03-14 NOTE — Patient Instructions (Signed)
COVID-19: How to Protect Yourself and Others Know how it spreads  There is currently no vaccine to prevent coronavirus disease 2019 (COVID-19).  The best way to prevent illness is to avoid being exposed to this virus.  The virus is thought to spread mainly from person-to-person. ? Between people who are in close contact with one another (within about 6 feet). ? Through respiratory droplets produced when an infected person coughs, sneezes or talks. ? These droplets can land in the mouths or noses of people who are nearby or possibly be inhaled into the lungs. ? Some recent studies have suggested that COVID-19 may be spread by people who are not showing symptoms. Everyone should Clean your hands often  Wash your hands often with soap and water for at least 20 seconds especially after you have been in a public place, or after blowing your nose, coughing, or sneezing.  If soap and water are not readily available, use a hand sanitizer that contains at least 60% alcohol. Cover all surfaces of your hands and rub them together until they feel dry.  Avoid touching your eyes, nose, and mouth with unwashed hands. Avoid close contact  Stay home if you are sick.  Avoid close contact with people who are sick.  Put distance between yourself and other people. ? Remember that some people without symptoms may be able to spread virus. ? This is especially important for people who are at higher risk of getting very sick.www.cdc.gov/coronavirus/2019-ncov/need-extra-precautions/people-at-higher-risk.html Cover your mouth and nose with a cloth face cover when around others  You could spread COVID-19 to others even if you do not feel sick.  Everyone should wear a cloth face cover when they have to go out in public, for example to the grocery store or to pick up other necessities. ? Cloth face coverings should not be placed on young children under age 2, anyone who has trouble breathing, or is unconscious,  incapacitated or otherwise unable to remove the mask without assistance.  The cloth face cover is meant to protect other people in case you are infected.  Do NOT use a facemask meant for a healthcare worker.  Continue to keep about 6 feet between yourself and others. The cloth face cover is not a substitute for social distancing. Cover coughs and sneezes  If you are in a private setting and do not have on your cloth face covering, remember to always cover your mouth and nose with a tissue when you cough or sneeze or use the inside of your elbow.  Throw used tissues in the trash.  Immediately wash your hands with soap and water for at least 20 seconds. If soap and water are not readily available, clean your hands with a hand sanitizer that contains at least 60% alcohol. Clean and disinfect  Clean AND disinfect frequently touched surfaces daily. This includes tables, doorknobs, light switches, countertops, handles, desks, phones, keyboards, toilets, faucets, and sinks. www.cdc.gov/coronavirus/2019-ncov/prevent-getting-sick/disinfecting-your-home.html  If surfaces are dirty, clean them: Use detergent or soap and water prior to disinfection.  Then, use a household disinfectant. You can see a list of EPA-registered household disinfectants here. cdc.gov/coronavirus 09/26/2018 This information is not intended to replace advice given to you by your health care provider. Make sure you discuss any questions you have with your health care provider. Document Released: 09/05/2018 Document Revised: 10/04/2018 Document Reviewed: 09/05/2018 Elsevier Patient Education  2020 [employer]  

## 2019-03-23 ENCOUNTER — Emergency Department (HOSPITAL_COMMUNITY): Payer: Medicare Other

## 2019-03-23 ENCOUNTER — Inpatient Hospital Stay (HOSPITAL_COMMUNITY)
Admission: EM | Admit: 2019-03-23 | Discharge: 2019-04-24 | DRG: 025 | Disposition: E | Payer: Medicare Other | Attending: General Surgery | Admitting: General Surgery

## 2019-03-23 ENCOUNTER — Inpatient Hospital Stay (HOSPITAL_COMMUNITY): Payer: Medicare Other

## 2019-03-23 ENCOUNTER — Encounter (HOSPITAL_COMMUNITY): Payer: Self-pay | Admitting: Emergency Medicine

## 2019-03-23 DIAGNOSIS — I129 Hypertensive chronic kidney disease with stage 1 through stage 4 chronic kidney disease, or unspecified chronic kidney disease: Secondary | ICD-10-CM | POA: Diagnosis present

## 2019-03-23 DIAGNOSIS — R402212 Coma scale, best verbal response, none, at arrival to emergency department: Secondary | ICD-10-CM | POA: Diagnosis present

## 2019-03-23 DIAGNOSIS — R402112 Coma scale, eyes open, never, at arrival to emergency department: Secondary | ICD-10-CM | POA: Diagnosis present

## 2019-03-23 DIAGNOSIS — Z515 Encounter for palliative care: Secondary | ICD-10-CM | POA: Diagnosis not present

## 2019-03-23 DIAGNOSIS — Z7952 Long term (current) use of systemic steroids: Secondary | ICD-10-CM

## 2019-03-23 DIAGNOSIS — S12600A Unspecified displaced fracture of seventh cervical vertebra, initial encounter for closed fracture: Secondary | ICD-10-CM | POA: Diagnosis present

## 2019-03-23 DIAGNOSIS — Z20828 Contact with and (suspected) exposure to other viral communicable diseases: Secondary | ICD-10-CM | POA: Diagnosis present

## 2019-03-23 DIAGNOSIS — R402312 Coma scale, best motor response, none, at arrival to emergency department: Secondary | ICD-10-CM | POA: Diagnosis present

## 2019-03-23 DIAGNOSIS — I469 Cardiac arrest, cause unspecified: Secondary | ICD-10-CM | POA: Diagnosis not present

## 2019-03-23 DIAGNOSIS — M109 Gout, unspecified: Secondary | ICD-10-CM | POA: Diagnosis present

## 2019-03-23 DIAGNOSIS — I959 Hypotension, unspecified: Secondary | ICD-10-CM | POA: Diagnosis present

## 2019-03-23 DIAGNOSIS — S0083XA Contusion of other part of head, initial encounter: Secondary | ICD-10-CM | POA: Diagnosis present

## 2019-03-23 DIAGNOSIS — T8619 Other complication of kidney transplant: Secondary | ICD-10-CM | POA: Diagnosis present

## 2019-03-23 DIAGNOSIS — Z9911 Dependence on respirator [ventilator] status: Secondary | ICD-10-CM

## 2019-03-23 DIAGNOSIS — N179 Acute kidney failure, unspecified: Secondary | ICD-10-CM

## 2019-03-23 DIAGNOSIS — E669 Obesity, unspecified: Secondary | ICD-10-CM | POA: Diagnosis present

## 2019-03-23 DIAGNOSIS — S80212A Abrasion, left knee, initial encounter: Secondary | ICD-10-CM | POA: Diagnosis present

## 2019-03-23 DIAGNOSIS — Y92410 Unspecified street and highway as the place of occurrence of the external cause: Secondary | ICD-10-CM

## 2019-03-23 DIAGNOSIS — Z9079 Acquired absence of other genital organ(s): Secondary | ICD-10-CM

## 2019-03-23 DIAGNOSIS — R34 Anuria and oliguria: Secondary | ICD-10-CM | POA: Diagnosis present

## 2019-03-23 DIAGNOSIS — Y830 Surgical operation with transplant of whole organ as the cause of abnormal reaction of the patient, or of later complication, without mention of misadventure at the time of the procedure: Secondary | ICD-10-CM | POA: Diagnosis present

## 2019-03-23 DIAGNOSIS — S80211A Abrasion, right knee, initial encounter: Secondary | ICD-10-CM | POA: Diagnosis present

## 2019-03-23 DIAGNOSIS — Z23 Encounter for immunization: Secondary | ICD-10-CM

## 2019-03-23 DIAGNOSIS — Z452 Encounter for adjustment and management of vascular access device: Secondary | ICD-10-CM

## 2019-03-23 DIAGNOSIS — N189 Chronic kidney disease, unspecified: Secondary | ICD-10-CM | POA: Diagnosis present

## 2019-03-23 DIAGNOSIS — S27321A Contusion of lung, unilateral, initial encounter: Secondary | ICD-10-CM

## 2019-03-23 DIAGNOSIS — Z79899 Other long term (current) drug therapy: Secondary | ICD-10-CM

## 2019-03-23 DIAGNOSIS — Z66 Do not resuscitate: Secondary | ICD-10-CM | POA: Diagnosis present

## 2019-03-23 DIAGNOSIS — N170 Acute kidney failure with tubular necrosis: Secondary | ICD-10-CM | POA: Diagnosis present

## 2019-03-23 DIAGNOSIS — Z7984 Long term (current) use of oral hypoglycemic drugs: Secondary | ICD-10-CM

## 2019-03-23 DIAGNOSIS — S065X9A Traumatic subdural hemorrhage with loss of consciousness of unspecified duration, initial encounter: Secondary | ICD-10-CM | POA: Diagnosis present

## 2019-03-23 DIAGNOSIS — E1122 Type 2 diabetes mellitus with diabetic chronic kidney disease: Secondary | ICD-10-CM | POA: Diagnosis present

## 2019-03-23 DIAGNOSIS — F419 Anxiety disorder, unspecified: Secondary | ICD-10-CM | POA: Diagnosis present

## 2019-03-23 DIAGNOSIS — I6200 Nontraumatic subdural hemorrhage, unspecified: Secondary | ICD-10-CM

## 2019-03-23 DIAGNOSIS — D631 Anemia in chronic kidney disease: Secondary | ICD-10-CM | POA: Diagnosis present

## 2019-03-23 DIAGNOSIS — Z6829 Body mass index (BMI) 29.0-29.9, adult: Secondary | ICD-10-CM | POA: Diagnosis not present

## 2019-03-23 LAB — I-STAT CHEM 8, ED
BUN: 27 mg/dL — ABNORMAL HIGH (ref 6–20)
Calcium, Ion: 0.94 mmol/L — ABNORMAL LOW (ref 1.15–1.40)
Chloride: 97 mmol/L — ABNORMAL LOW (ref 98–111)
Creatinine, Ser: 3.5 mg/dL — ABNORMAL HIGH (ref 0.61–1.24)
Glucose, Bld: >700 mg/dL (ref 70–99)
HCT: 30 % — ABNORMAL LOW (ref 39.0–52.0)
Hemoglobin: 10.2 g/dL — ABNORMAL LOW (ref 13.0–17.0)
Potassium: 3.1 mmol/L — ABNORMAL LOW (ref 3.5–5.1)
Sodium: 134 mmol/L — ABNORMAL LOW (ref 135–145)
TCO2: 21 mmol/L — ABNORMAL LOW (ref 22–32)

## 2019-03-23 LAB — MRSA PCR SCREENING: MRSA by PCR: NEGATIVE

## 2019-03-23 LAB — POCT I-STAT 7, (LYTES, BLD GAS, ICA,H+H)
Acid-base deficit: 6 mmol/L — ABNORMAL HIGH (ref 0.0–2.0)
Acid-base deficit: 8 mmol/L — ABNORMAL HIGH (ref 0.0–2.0)
Acid-base deficit: 6 mmol/L — ABNORMAL HIGH (ref 0.0–2.0)
Bicarbonate: 20.8 mmol/L (ref 20.0–28.0)
Bicarbonate: 18.6 mmol/L — ABNORMAL LOW (ref 20.0–28.0)
Bicarbonate: 18.2 mmol/L — ABNORMAL LOW (ref 20.0–28.0)
Calcium, Ion: 1.02 mmol/L — ABNORMAL LOW (ref 1.15–1.40)
Calcium, Ion: 1.09 mmol/L — ABNORMAL LOW (ref 1.15–1.40)
Calcium, Ion: 1.06 mmol/L — ABNORMAL LOW (ref 1.15–1.40)
HCT: 25 % — ABNORMAL LOW (ref 39.0–52.0)
HCT: 27 % — ABNORMAL LOW (ref 39.0–52.0)
HCT: 27 % — ABNORMAL LOW (ref 39.0–52.0)
Hemoglobin: 8.5 g/dL — ABNORMAL LOW (ref 13.0–17.0)
Hemoglobin: 9.2 g/dL — ABNORMAL LOW (ref 13.0–17.0)
Hemoglobin: 9.2 g/dL — ABNORMAL LOW (ref 13.0–17.0)
O2 Saturation: 93 %
O2 Saturation: 87 %
O2 Saturation: 96 %
Patient temperature: 99.5
Potassium: 3.1 mmol/L — ABNORMAL LOW (ref 3.5–5.1)
Potassium: 2.5 mmol/L — CL (ref 3.5–5.1)
Potassium: 2.5 mmol/L — CL (ref 3.5–5.1)
Sodium: 130 mmol/L — ABNORMAL LOW (ref 135–145)
Sodium: 140 mmol/L (ref 135–145)
Sodium: 140 mmol/L (ref 135–145)
TCO2: 22 mmol/L (ref 22–32)
TCO2: 20 mmol/L — ABNORMAL LOW (ref 22–32)
TCO2: 19 mmol/L — ABNORMAL LOW (ref 22–32)
pCO2 arterial: 43.9 mmHg (ref 32.0–48.0)
pCO2 arterial: 41.7 mmHg (ref 32.0–48.0)
pCO2 arterial: 31 mmHg — ABNORMAL LOW (ref 32.0–48.0)
pH, Arterial: 7.284 — ABNORMAL LOW (ref 7.350–7.450)
pH, Arterial: 7.258 — ABNORMAL LOW (ref 7.350–7.450)
pH, Arterial: 7.379 (ref 7.350–7.450)
pO2, Arterial: 76 mmHg — ABNORMAL LOW (ref 83.0–108.0)
pO2, Arterial: 61 mmHg — ABNORMAL LOW (ref 83.0–108.0)
pO2, Arterial: 87 mmHg (ref 83.0–108.0)

## 2019-03-23 LAB — GLUCOSE, CAPILLARY
Glucose-Capillary: 329 mg/dL — ABNORMAL HIGH (ref 70–99)
Glucose-Capillary: 264 mg/dL — ABNORMAL HIGH (ref 70–99)
Glucose-Capillary: 219 mg/dL — ABNORMAL HIGH (ref 70–99)
Glucose-Capillary: 143 mg/dL — ABNORMAL HIGH (ref 70–99)
Glucose-Capillary: 90 mg/dL (ref 70–99)
Glucose-Capillary: 71 mg/dL (ref 70–99)
Glucose-Capillary: 92 mg/dL (ref 70–99)

## 2019-03-23 LAB — COMPREHENSIVE METABOLIC PANEL
ALT: 18 U/L (ref 0–44)
AST: 21 U/L (ref 15–41)
Albumin: 3.8 g/dL (ref 3.5–5.0)
Alkaline Phosphatase: 141 U/L — ABNORMAL HIGH (ref 38–126)
Anion gap: 13 (ref 5–15)
BUN: 25 mg/dL — ABNORMAL HIGH (ref 6–20)
CO2: 19 mmol/L — ABNORMAL LOW (ref 22–32)
Calcium: 7.6 mg/dL — ABNORMAL LOW (ref 8.9–10.3)
Chloride: 98 mmol/L (ref 98–111)
Creatinine, Ser: 3.58 mg/dL — ABNORMAL HIGH (ref 0.61–1.24)
GFR calc Af Amer: 21 mL/min — ABNORMAL LOW (ref >60)
GFR calc non Af Amer: 18 mL/min — ABNORMAL LOW (ref >60)
Glucose, Bld: 687 mg/dL (ref 70–99)
Potassium: 3 mmol/L — ABNORMAL LOW (ref 3.5–5.1)
Sodium: 130 mmol/L — ABNORMAL LOW (ref 135–145)
Total Bilirubin: 0.9 mg/dL (ref 0.3–1.2)
Total Protein: 6 g/dL — ABNORMAL LOW (ref 6.5–8.1)

## 2019-03-23 LAB — CBC
HCT: 28.8 % — ABNORMAL LOW (ref 39.0–52.0)
Hemoglobin: 9.9 g/dL — ABNORMAL LOW (ref 13.0–17.0)
MCH: 27.2 pg (ref 26.0–34.0)
MCHC: 34.4 g/dL (ref 30.0–36.0)
MCV: 79.1 fL — ABNORMAL LOW (ref 80.0–100.0)
Platelets: 181 10*3/uL (ref 150–400)
RBC: 3.64 MIL/uL — ABNORMAL LOW (ref 4.22–5.81)
RDW: 13.5 % (ref 11.5–15.5)
WBC: 5 10*3/uL (ref 4.0–10.5)
nRBC: 0 % (ref 0.0–0.2)

## 2019-03-23 LAB — URINALYSIS, ROUTINE W REFLEX MICROSCOPIC
Bacteria, UA: NONE SEEN
Bilirubin Urine: NEGATIVE
Glucose, UA: >=500 mg/dL — AB
Ketones, ur: NEGATIVE mg/dL
Leukocytes,Ua: NEGATIVE
Nitrite: NEGATIVE
Protein, ur: 100 mg/dL — AB
Specific Gravity, Urine: 1.022 (ref 1.005–1.030)
pH: 5 (ref 5.0–8.0)

## 2019-03-23 LAB — SAMPLE TO BLOOD BANK

## 2019-03-23 LAB — CBG MONITORING, ED
Glucose-Capillary: >600 mg/dL (ref 70–99)
Glucose-Capillary: 554 mg/dL (ref 70–99)
Glucose-Capillary: 518 mg/dL (ref 70–99)

## 2019-03-23 LAB — LACTIC ACID, PLASMA: Lactic Acid, Venous: 1.4 mmol/L (ref 0.5–1.9)

## 2019-03-23 LAB — ETHANOL: Alcohol, Ethyl (B): <10 mg/dL (ref <10)

## 2019-03-23 LAB — SODIUM: Sodium: 139 mmol/L (ref 135–145)

## 2019-03-23 LAB — PROTIME-INR
INR: 1 (ref 0.8–1.2)
Prothrombin Time: 13.3 seconds (ref 11.4–15.2)

## 2019-03-23 LAB — HIV ANTIBODY (ROUTINE TESTING W REFLEX): HIV Screen 4th Generation wRfx: NONREACTIVE

## 2019-03-23 LAB — SARS CORONAVIRUS 2 BY RT PCR (HOSPITAL ORDER, PERFORMED IN ~~LOC~~ HOSPITAL LAB): SARS Coronavirus 2: NEGATIVE

## 2019-03-23 LAB — CDS SEROLOGY

## 2019-03-23 MED ORDER — CEFAZOLIN SODIUM-DEXTROSE 2-4 GM/100ML-% IV SOLN
2.0000 g | Freq: Once | INTRAVENOUS | Status: AC
  Administered 2019-03-23: 2 g via INTRAVENOUS

## 2019-03-23 MED ORDER — INSULIN REGULAR(HUMAN) IN NACL 100-0.9 UT/100ML-% IV SOLN
INTRAVENOUS | Status: DC
  Administered 2019-03-23: 5.4 [IU]/h via INTRAVENOUS
  Filled 2019-03-23: qty 100

## 2019-03-23 MED ORDER — CHLORHEXIDINE GLUCONATE 0.12% ORAL RINSE (MEDLINE KIT)
15.0000 mL | Freq: Two times a day (BID) | OROMUCOSAL | Status: DC
  Administered 2019-03-23 – 2019-03-25 (×5): 15 mL via OROMUCOSAL

## 2019-03-23 MED ORDER — LACTATED RINGERS IV SOLN
INTRAVENOUS | Status: DC
  Administered 2019-03-23: 19:00:00 via INTRAVENOUS

## 2019-03-23 MED ORDER — NOREPINEPHRINE 4 MG/250ML-% IV SOLN
0.0000 ug/min | INTRAVENOUS | Status: DC
  Administered 2019-03-23: 35 ug/min via INTRAVENOUS
  Administered 2019-03-23: 20 ug/min via INTRAVENOUS
  Administered 2019-03-23: 5 ug/min via INTRAVENOUS
  Administered 2019-03-24: 8 ug/min via INTRAVENOUS
  Administered 2019-03-24: 11 ug/min via INTRAVENOUS
  Administered 2019-03-24: 9 ug/min via INTRAVENOUS
  Administered 2019-03-25: 5 ug/min via INTRAVENOUS
  Administered 2019-03-25: 7 ug/min via INTRAVENOUS
  Filled 2019-03-23 (×7): qty 250

## 2019-03-23 MED ORDER — CISATRACURIUM BOLUS VIA INFUSION
7.0000 mg | Freq: Once | INTRAVENOUS | Status: DC
  Filled 2019-03-23: qty 7

## 2019-03-23 MED ORDER — CHLORHEXIDINE GLUCONATE CLOTH 2 % EX PADS
6.0000 | MEDICATED_PAD | Freq: Every day | CUTANEOUS | Status: DC
  Administered 2019-03-23 – 2019-03-26 (×4): 6 via TOPICAL

## 2019-03-23 MED ORDER — PROPOFOL 1000 MG/100ML IV EMUL: 0.0000 ug/kg/min | INTRAVENOUS | Status: DC

## 2019-03-23 MED ORDER — MANNITOL 25 % IV SOLN: 25.0000 g | Freq: Once | Status: DC

## 2019-03-23 MED ORDER — PROPOFOL 1000 MG/100ML IV EMUL
5.0000 ug/kg/min | INTRAVENOUS | Status: DC
  Administered 2019-03-23: 80 ug/kg/min via INTRAVENOUS
  Filled 2019-03-23: qty 100

## 2019-03-23 MED ORDER — MANNITOL 25 % IV SOLN
INTRAVENOUS | Status: AC
  Administered 2019-03-23: 25 g
  Filled 2019-03-23: qty 50

## 2019-03-23 MED ORDER — BISACODYL 10 MG RE SUPP: 10.0000 mg | Freq: Every day | RECTAL | Status: DC | PRN

## 2019-03-23 MED ORDER — ONDANSETRON HCL 4 MG/2ML IJ SOLN: 4.0000 mg | Freq: Four times a day (QID) | INTRAMUSCULAR | Status: DC | PRN

## 2019-03-23 MED ORDER — METOPROLOL TARTRATE 5 MG/5ML IV SOLN: 5.0000 mg | Freq: Four times a day (QID) | INTRAVENOUS | Status: DC | PRN

## 2019-03-23 MED ORDER — ETOMIDATE 2 MG/ML IV SOLN
INTRAVENOUS | Status: AC | PRN
  Administered 2019-03-23: 20 mg via INTRAVENOUS

## 2019-03-23 MED ORDER — PANTOPRAZOLE SODIUM 40 MG IV SOLR
40.0000 mg | Freq: Every day | INTRAVENOUS | Status: DC
  Administered 2019-03-24 – 2019-03-25 (×2): 40 mg via INTRAVENOUS
  Filled 2019-03-23 (×2): qty 40

## 2019-03-23 MED ORDER — DOCUSATE SODIUM 50 MG/5ML PO LIQD
100.0000 mg | Freq: Every day | ORAL | Status: DC
  Administered 2019-03-25: 100 mg
  Filled 2019-03-23 (×3): qty 10

## 2019-03-23 MED ORDER — NALOXONE HCL 2 MG/2ML IJ SOSY
PREFILLED_SYRINGE | INTRAMUSCULAR | Status: AC
  Filled 2019-03-23: qty 2

## 2019-03-23 MED ORDER — INSULIN ASPART 100 UNIT/ML IV SOLN
10.0000 [IU] | Freq: Once | INTRAVENOUS | Status: AC
  Administered 2019-03-23: 10 [IU] via INTRAVENOUS

## 2019-03-23 MED ORDER — MIDAZOLAM 50MG/50ML (1MG/ML) PREMIX INFUSION
2.0000 mg/h | INTRAVENOUS | Status: DC
  Administered 2019-03-23: 8 mg/h via INTRAVENOUS
  Administered 2019-03-23: 0.5 mg/h via INTRAVENOUS
  Administered 2019-03-24: 7 mg/h via INTRAVENOUS
  Administered 2019-03-24 – 2019-03-25 (×4): 8 mg/h via INTRAVENOUS
  Administered 2019-03-25: 9 mg/h via INTRAVENOUS
  Administered 2019-03-25 – 2019-03-26 (×3): 8 mg/h via INTRAVENOUS
  Filled 2019-03-23 (×11): qty 50

## 2019-03-23 MED ORDER — ACETAMINOPHEN 160 MG/5ML PO SOLN
325.0000 mg | Freq: Four times a day (QID) | ORAL | Status: DC | PRN
  Filled 2019-03-23: qty 20.3

## 2019-03-23 MED ORDER — PANTOPRAZOLE SODIUM 40 MG PO TBEC: 40.0000 mg | DELAYED_RELEASE_TABLET | Freq: Every day | ORAL | Status: DC

## 2019-03-23 MED ORDER — FENTANYL CITRATE (PF) 100 MCG/2ML IJ SOLN: 50.0000 ug | INTRAMUSCULAR | Status: DC | PRN

## 2019-03-23 MED ORDER — MANNITOL 25 % IV SOLN
25.0000 g | Freq: Once | INTRAVENOUS | Status: AC
  Administered 2019-03-25: 25 g via INTRAVENOUS
  Filled 2019-03-23: qty 50

## 2019-03-23 MED ORDER — FENTANYL CITRATE (PF) 100 MCG/2ML IJ SOLN: 50.0000 ug | Freq: Once | INTRAMUSCULAR | Status: DC

## 2019-03-23 MED ORDER — ORAL CARE MOUTH RINSE
15.0000 mL | OROMUCOSAL | Status: DC
  Administered 2019-03-23 – 2019-03-26 (×25): 15 mL via OROMUCOSAL

## 2019-03-23 MED ORDER — ROCURONIUM BROMIDE 50 MG/5ML IV SOLN
INTRAVENOUS | Status: AC | PRN
  Administered 2019-03-23: 100 mg via INTRAVENOUS

## 2019-03-23 MED ORDER — FENTANYL 2500MCG IN NS 250ML (10MCG/ML) PREMIX INFUSION: 0.0000 ug/h | INTRAVENOUS | Status: DC

## 2019-03-23 MED ORDER — ONDANSETRON 4 MG PO TBDP: 4.0000 mg | ORAL_TABLET | Freq: Four times a day (QID) | ORAL | Status: DC | PRN

## 2019-03-23 MED ORDER — OXYCODONE HCL 5 MG PO TABS: 5.0000 mg | ORAL_TABLET | ORAL | Status: DC | PRN

## 2019-03-23 MED ORDER — VECURONIUM BROMIDE 10 MG IV SOLR: 0.0000 ug/kg/min | INTRAVENOUS | Status: DC

## 2019-03-23 MED ORDER — IOHEXOL 300 MG/ML  SOLN
100.0000 mL | Freq: Once | INTRAMUSCULAR | Status: AC | PRN
  Administered 2019-03-23: 100 mL via INTRAVENOUS

## 2019-03-23 MED ORDER — LEVETIRACETAM IN NACL 500 MG/100ML IV SOLN
500.0000 mg | Freq: Two times a day (BID) | INTRAVENOUS | Status: DC
  Administered 2019-03-24 – 2019-03-27 (×7): 500 mg via INTRAVENOUS
  Filled 2019-03-23 (×7): qty 100

## 2019-03-23 MED ORDER — FENTANYL BOLUS VIA INFUSION
50.0000 ug | INTRAVENOUS | Status: DC | PRN
  Filled 2019-03-23: qty 50

## 2019-03-23 MED ORDER — SODIUM CHLORIDE 0.9 % IV SOLN
0.0000 ug/kg/min | INTRAVENOUS | Status: DC
  Filled 2019-03-23: qty 20

## 2019-03-23 MED ORDER — VECURONIUM BOLUS VIA INFUSION: 0.0800 mg/kg | Freq: Once | INTRAVENOUS | Status: DC

## 2019-03-23 MED ORDER — FENTANYL 2500MCG IN NS 250ML (10MCG/ML) PREMIX INFUSION
50.0000 ug/h | INTRAVENOUS | Status: DC
  Administered 2019-03-24 – 2019-03-26 (×5): 200 ug/h via INTRAVENOUS
  Filled 2019-03-23 (×6): qty 250

## 2019-03-23 MED ORDER — SODIUM CHLORIDE 0.9 % IV SOLN
INTRAVENOUS | Status: DC
  Administered 2019-03-23: 1000 mL via INTRAVENOUS

## 2019-03-23 MED ORDER — SODIUM CHLORIDE 3 % IV SOLN
INTRAVENOUS | Status: DC
  Administered 2019-03-23 – 2019-03-25 (×3): 25 mL/h via INTRAVENOUS
  Filled 2019-03-23 (×4): qty 500

## 2019-03-23 MED ORDER — SODIUM CHLORIDE 0.9 % IV SOLN: INTRAVENOUS | Status: DC | PRN

## 2019-03-23 MED ORDER — IOHEXOL 350 MG/ML SOLN
50.0000 mL | Freq: Once | INTRAVENOUS | Status: AC | PRN
  Administered 2019-03-23: 50 mL via INTRAVENOUS

## 2019-03-23 MED ORDER — TETANUS-DIPHTH-ACELL PERTUSSIS 5-2.5-18.5 LF-MCG/0.5 IM SUSP
0.5000 mL | Freq: Once | INTRAMUSCULAR | Status: AC
  Administered 2019-03-23: 0.5 mL via INTRAMUSCULAR

## 2019-03-23 MED ORDER — NOREPINEPHRINE 4 MG/250ML-% IV SOLN
INTRAVENOUS | Status: AC
  Filled 2019-03-23: qty 250

## 2019-03-23 MED ORDER — LEVETIRACETAM IN NACL 1000 MG/100ML IV SOLN: 1000.0000 mg | Freq: Once | INTRAVENOUS | Status: DC

## 2019-03-23 MED ORDER — INSULIN REGULAR(HUMAN) IN NACL 100-0.9 UT/100ML-% IV SOLN
INTRAVENOUS | Status: DC
  Administered 2019-03-23: 9.9 [IU]/h via INTRAVENOUS
  Administered 2019-03-24: 5.9 [IU]/h via INTRAVENOUS
  Filled 2019-03-23: qty 100

## 2019-03-23 MED ORDER — ARTIFICIAL TEARS OPHTHALMIC OINT
1.0000 "application " | TOPICAL_OINTMENT | Freq: Three times a day (TID) | OPHTHALMIC | Status: DC
  Administered 2019-03-24 – 2019-03-26 (×6): 1 via OPHTHALMIC
  Filled 2019-03-23: qty 3.5

## 2019-03-23 MED ORDER — SODIUM CHLORIDE 0.9 % IV SOLN
INTRAVENOUS | Status: AC | PRN
  Administered 2019-03-23: 1000 mL via INTRAVENOUS

## 2019-03-23 MED ORDER — DEXTROSE-NACL 5-0.45 % IV SOLN: INTRAVENOUS | Status: DC
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
## 2019-03-23 NOTE — ED Notes (Signed)
To CT with RN.

## 2019-03-23 NOTE — Progress Notes (Signed)
Repeated CT head for sustained high ICPs. NO mass lesion. diffuse edema and DAI. Continue medical measures for ICP but I suspect the prognosis is poor
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
## 2019-03-23 NOTE — Progress Notes (Signed)
Pt transported to and from Trauma A to CT 2 on ventilator. Pt stable throughout with no complications. VS within normal limits. RT will continue to monitor

## 2019-03-23 NOTE — Procedures (Signed)
Arterial Catheter Insertion Procedure Note Ernesto Zukowski [id number] 1965/12/30  Procedure: Insertion of Arterial Catheter  Indications: Blood pressure monitoring and Frequent blood sampling  Procedure Details Consent: Unable to obtain consent because of altered level of consciousness. Time Out: Verified patient identification, verified procedure, site/side was marked, verified correct patient position, special equipment/implants available, medications/allergies/relevent history reviewed, required imaging and test results available.  Performed  Maximum sterile technique was used including antiseptics, cap, gloves, gown, hand hygiene, mask and sheet. Skin prep: Chlorhexidine; local anesthetic administered 20 gauge catheter was inserted into left radial artery using the Seldinger technique. ULTRASOUND GUIDANCE USED: NO Evaluation Blood flow good; BP tracing good. Complications: No apparent complications.   Esperanza Sheets T 03/24/2019
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
## 2019-03-23 NOTE — Procedures (Signed)
Patient was prepped and draped in sterile fashion. Insertion point was visualized mid pupillary line just behind the patients hair line. Hand drill was used to make a small hole in the skull. Pressure monitor was then screwed in and the dura was punctured. Camino catheter was connected to the monitor and zeroed to atmospheric pressure. Camino catheter was then inserted to appropriate depth and secured down. Patient's initial ICP was 19. Patient tolerated procedure well and vital signs stable. Once we sat the patient up, his ICP dropped to 15.

## 2019-03-23 NOTE — ED Notes (Signed)
ICP 55mm after bolt insertion by Dr. Ronnald Ramp

## 2019-03-23 NOTE — ED Notes (Signed)
All patient belongings given to patient's nephew.

## 2019-03-23 NOTE — Procedures (Signed)
   Procedure Note  Date: 03/08/2019  Procedure: central venous catheter placement--right, femoral vein, without ultrasound guidance  Pre-op diagnosis: hypotension, need for vasopressors Post-op diagnosis: same  Surgeon: Jesusita Oka, MD  Anesthesia: local  EBL: <5cc Drains/Implants: 20 cm, triple lumen central venous catheter  Description of procedure: Time-out was performed verifying correct patient, procedure, site, laterality, and signature of informed consent. Initially, the left chest was prepped and draped and the left subclavian accessed. The guidewire initially passed easily, but met resistance. Xray was performed to visualize wire placement and aid in placement, but there was ultimately too much resistance, likely due to central stenosis. The right groin was then prepped and draped in the usual sterile fashion. The femoral vein was localized with ultrasound guidance. Five ccs of local anesthetic was infiltrated at the site of venous access. The femoral vein was accessed using an introducer needle and a guidewire passed through the needle. The needle was removed and a skin nick was made. The tract was dilated and the central venous catheter advanced over the guidewire followed by removal of the guidewire. All ports drew blood easily and all were flushed with saline. The catheter was secured to the skin with suture and a sterile dressing. The patient tolerated the procedure well. There were no immediate complications.   Jesusita Oka, MD General and [facility]
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
## 2019-03-24 ENCOUNTER — Inpatient Hospital Stay (HOSPITAL_COMMUNITY): Payer: Medicare Other

## 2019-03-24 LAB — GLUCOSE, CAPILLARY
Glucose-Capillary: 118 mg/dL — ABNORMAL HIGH (ref 70–99)
Glucose-Capillary: 129 mg/dL — ABNORMAL HIGH (ref 70–99)
Glucose-Capillary: 131 mg/dL — ABNORMAL HIGH (ref 70–99)
Glucose-Capillary: 136 mg/dL — ABNORMAL HIGH (ref 70–99)
Glucose-Capillary: 139 mg/dL — ABNORMAL HIGH (ref 70–99)
Glucose-Capillary: 139 mg/dL — ABNORMAL HIGH (ref 70–99)
Glucose-Capillary: 140 mg/dL — ABNORMAL HIGH (ref 70–99)
Glucose-Capillary: 156 mg/dL — ABNORMAL HIGH (ref 70–99)
Glucose-Capillary: 160 mg/dL — ABNORMAL HIGH (ref 70–99)
Glucose-Capillary: 169 mg/dL — ABNORMAL HIGH (ref 70–99)
Glucose-Capillary: 169 mg/dL — ABNORMAL HIGH (ref 70–99)
Glucose-Capillary: 175 mg/dL — ABNORMAL HIGH (ref 70–99)
Glucose-Capillary: 179 mg/dL — ABNORMAL HIGH (ref 70–99)
Glucose-Capillary: 180 mg/dL — ABNORMAL HIGH (ref 70–99)
Glucose-Capillary: 184 mg/dL — ABNORMAL HIGH (ref 70–99)
Glucose-Capillary: 186 mg/dL — ABNORMAL HIGH (ref 70–99)
Glucose-Capillary: 189 mg/dL — ABNORMAL HIGH (ref 70–99)
Glucose-Capillary: 207 mg/dL — ABNORMAL HIGH (ref 70–99)

## 2019-03-24 LAB — CBC
HCT: 25.9 % — ABNORMAL LOW (ref 39.0–52.0)
Hemoglobin: 8.9 g/dL — ABNORMAL LOW (ref 13.0–17.0)
MCH: 26.9 pg (ref 26.0–34.0)
MCHC: 34.4 g/dL (ref 30.0–36.0)
MCV: 78.2 fL — ABNORMAL LOW (ref 80.0–100.0)
Platelets: 224 10*3/uL (ref 150–400)
RBC: 3.31 MIL/uL — ABNORMAL LOW (ref 4.22–5.81)
RDW: 13.6 % (ref 11.5–15.5)
WBC: 14.1 10*3/uL — ABNORMAL HIGH (ref 4.0–10.5)
nRBC: 0 % (ref 0.0–0.2)

## 2019-03-24 LAB — COMPREHENSIVE METABOLIC PANEL
ALT: 17 U/L (ref 0–44)
AST: 35 U/L (ref 15–41)
Albumin: 3 g/dL — ABNORMAL LOW (ref 3.5–5.0)
Alkaline Phosphatase: 112 U/L (ref 38–126)
Anion gap: 15 (ref 5–15)
BUN: 33 mg/dL — ABNORMAL HIGH (ref 6–20)
CO2: 15 mmol/L — ABNORMAL LOW (ref 22–32)
Calcium: 7.6 mg/dL — ABNORMAL LOW (ref 8.9–10.3)
Chloride: 108 mmol/L (ref 98–111)
Creatinine, Ser: 4.19 mg/dL — ABNORMAL HIGH (ref 0.61–1.24)
GFR calc Af Amer: 17 mL/min — ABNORMAL LOW (ref >60)
GFR calc non Af Amer: 15 mL/min — ABNORMAL LOW (ref >60)
Glucose, Bld: 191 mg/dL — ABNORMAL HIGH (ref 70–99)
Potassium: 3.2 mmol/L — ABNORMAL LOW (ref 3.5–5.1)
Sodium: 138 mmol/L (ref 135–145)
Total Bilirubin: 1.2 mg/dL (ref 0.3–1.2)
Total Protein: 5.3 g/dL — ABNORMAL LOW (ref 6.5–8.1)

## 2019-03-24 LAB — POCT I-STAT 7, (LYTES, BLD GAS, ICA,H+H)
Acid-base deficit: 9 mmol/L — ABNORMAL HIGH (ref 0.0–2.0)
Bicarbonate: 15.2 mmol/L — ABNORMAL LOW (ref 20.0–28.0)
Calcium, Ion: 1.02 mmol/L — ABNORMAL LOW (ref 1.15–1.40)
HCT: 29 % — ABNORMAL LOW (ref 39.0–52.0)
Hemoglobin: 9.9 g/dL — ABNORMAL LOW (ref 13.0–17.0)
O2 Saturation: 99 %
Patient temperature: 97.8
Potassium: 3.7 mmol/L (ref 3.5–5.1)
Sodium: 139 mmol/L (ref 135–145)
TCO2: 16 mmol/L — ABNORMAL LOW (ref 22–32)
pCO2 arterial: 25.3 mmHg — ABNORMAL LOW (ref 32.0–48.0)
pH, Arterial: 7.385 (ref 7.350–7.450)
pO2, Arterial: 113 mmHg — ABNORMAL HIGH (ref 83.0–108.0)

## 2019-03-24 LAB — TRIGLYCERIDES: Triglycerides: 100 mg/dL (ref <150)

## 2019-03-24 LAB — SODIUM
Sodium: 139 mmol/L (ref 135–145)
Sodium: 142 mmol/L (ref 135–145)
Sodium: 141 mmol/L (ref 135–145)

## 2019-03-24 MED ORDER — INSULIN DETEMIR 100 UNIT/ML ~~LOC~~ SOLN
10.0000 [IU] | Freq: Two times a day (BID) | SUBCUTANEOUS | Status: DC
  Administered 2019-03-24 – 2019-03-25 (×4): 10 [IU] via SUBCUTANEOUS
  Filled 2019-03-24 (×6): qty 0.1

## 2019-03-24 MED ORDER — FUROSEMIDE 10 MG/ML IJ SOLN
80.0000 mg | Freq: Three times a day (TID) | INTRAMUSCULAR | Status: DC
  Administered 2019-03-24 – 2019-03-25 (×3): 80 mg via INTRAVENOUS
  Filled 2019-03-24 (×3): qty 8

## 2019-03-24 MED ORDER — TACROLIMUS 1 MG PO CAPS
8.0000 mg | ORAL_CAPSULE | Freq: Two times a day (BID) | ORAL | Status: DC
  Administered 2019-03-24 – 2019-03-25 (×4): 8 mg via ORAL
  Filled 2019-03-24 (×6): qty 8

## 2019-03-24 MED ORDER — MYCOPHENOLATE MOFETIL 250 MG PO CAPS
500.0000 mg | ORAL_CAPSULE | Freq: Two times a day (BID) | ORAL | Status: DC
  Filled 2019-03-24 (×2): qty 2

## 2019-03-24 MED ORDER — INSULIN ASPART 100 UNIT/ML ~~LOC~~ SOLN
1.0000 [IU] | SUBCUTANEOUS | Status: DC
  Administered 2019-03-24: 1 [IU] via SUBCUTANEOUS
  Administered 2019-03-25 (×5): 2 [IU] via SUBCUTANEOUS

## 2019-03-24 MED ORDER — PREDNISONE 5 MG PO TABS
5.0000 mg | ORAL_TABLET | Freq: Every day | ORAL | Status: DC
  Administered 2019-03-25: 5 mg via ORAL
  Filled 2019-03-24 (×2): qty 1

## 2019-03-24 MED ORDER — MYCOPHENOLATE 200 MG/ML ORAL SUSPENSION
500.0000 mg | Freq: Two times a day (BID) | ORAL | Status: DC
  Administered 2019-03-24 – 2019-03-25 (×3): 500 mg
  Filled 2019-03-24 (×6): qty 10
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
## 2019-03-24 NOTE — Progress Notes (Signed)
Late note: RT, 2 RNs and NT transported pt to CT and back to 1G62 without complication. RT will continue to monitor.

## 2019-03-24 NOTE — Progress Notes (Signed)
RT, 2 RNs, and NT transported pt from 4N24 to CT and back without complication. Pt respiratory status stable throughout transport. RT will continue to monitor.

## 2019-03-24 NOTE — Procedures (Signed)
Patient was prepped and draped in sterile fashion. Insertion point was visualized mid pupillary line just behind the patients hair line. Hand drill was used to make a small hole in the skull. Pressure monitor was then screwed in and the dura was punctured. Camino catheter was connected to the monitor and zeroed to atmospheric pressure. Camino catheter was then inserted to appropriate depth and secured down. Patient's initial ICP was 18. Patient tolerated procedure well and vital signs stable.

## 2019-03-24 NOTE — Progress Notes (Signed)
Patient remains comatose with medications off for an hour.  No response to deep noxious stimuli.  Pupils small and very sluggish.  He does have corneal reflexes.  Minimal gag.  CT scan reviewed.  No subdural hematoma.  No mass-effect.  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  Mild amount of blood either in the brainstem or in the interpeduncular fossa.  My nurse practitioner got a call from the ICU nurse while they were in the scanner and was told that his ICP monitor had been "pulled" and was no longer reading correctly.  We found the ICP monitor to be broken at midshaft and so we have removed it.  Unfortunately the part of the screws into the skull is left in the skull.  Do not believe that surgery to drill this out is necessary.  It carries more potential risk than benefit at this point.  However, I would think it would take significant force or torque or trauma to the ICP monitor to break it like that.  I am not sure how that occurred.  We will replace the ICP monitor since we do not have an exam to follow and he did receive some treatment for ICP early last evening.
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
## 2019-03-24 NOTE — Progress Notes (Signed)
Patient ID: Austin Murphy, male   DOB: Oct 20, 1965, 53 y.o.   MRN: [id number]  Follow up - Trauma and Critical Care  Patient Details:    Austin Murphy is an 53 y.o. male.  Lines/tubes : Airway 7.5 mm (Active)  Secured at (cm) 25 cm 03/24/19 0819  Measured From Lips 03/24/19 [place] 03/24/19 0819  Secured By [employer] 03/24/19 0819  Tube Holder Repositioned Yes 03/24/19 0819  Cuff Pressure (cm H2O) 24 cm H2O 03/08/2019 2049  Site Condition Dry 03/24/19 0819     CVC Triple Lumen 03/12/2019 Right Femoral (Active)  Indication for Insertion or Continuance of Line Vasoactive infusions 03/24/19 0800  Site Assessment Intact;Bleeding 03/24/19 0800  Proximal Lumen Status Capped (Central line) 03/22/2019 2000  Medial Lumen Status Capped (Central line) 03/10/2019 2000  Distal Lumen Status In-line blood sampling system in place 03/19/2019 2000  Dressing Type Transparent;Occlusive 03/24/19 0800  Dressing Status Dry;Intact;Antimicrobial disc in place;Old drainage;Clean 03/24/19 0800  Line Care Connections checked and tightened;Line pulled back 03/24/19 0800  Dressing Change Due 03/30/19 03/01/2019 2000     Arterial Line 03/22/2019 Left Radial (Active)  Site Assessment Clean;Dry;Intact 03/24/19 0800  Line Status Pulsatile blood flow 03/24/19 0800  Art Line Waveform Appropriate 03/24/19 0800  Art Line Interventions Zeroed and calibrated;Leveled 02/25/2019 2000  Color/Movement/Sensation Capillary refill less than 3 sec 03/24/19 0800  Dressing Type Transparent;Occlusive 03/24/19 0800  Dressing Status Clean;Dry;Intact;Antimicrobial disc in place 03/24/19 0800  Dressing Change Due 03/30/19 03/01/2019 2000     NG/OG Tube Orogastric 16 Fr. Center mouth Aucultation (Active)  Site Assessment Clean;Dry;Intact 03/24/19 0800  Ongoing Placement Verification Xray 03/24/19 0800  Status Suction-low intermittent 03/24/19 0800     Urethral Catheter Dulcy Fanny Late[employer];Straight-tip 16 Fr.  (Active)  Indication for Insertion or Continuance of Catheter Unstable critically ill patients first 24-48 hours (See Criteria) 03/24/19 0800  Site Assessment Intact;Clean 03/24/19 0800  Catheter Maintenance Bag below level of bladder;Catheter secured 03/24/19 0800  Collection Container Standard drainage bag 03/24/19 0800  Securement Method Leg strap 03/24/19 0800  Urinary Catheter Interventions (if applicable) Unclamped 37/62/83 0800  Output (mL) 0 mL 03/24/19 0700    Microbiology/Sepsis markers: Results for orders placed or performed during the hospital encounter of 02/27/2019  SARS Coronavirus 2 by RT PCR (hospital order, performed in [facility] hospital lab) Nasopharyngeal Nasopharyngeal Swab     Status: None   Collection Time: 02/25/2019 11:01 AM   Specimen: Nasopharyngeal Swab  Result Value Ref Range Status   SARS Coronavirus 2 NEGATIVE NEGATIVE Final    Comment: (NOTE) If result is NEGATIVE SARS-CoV-2 target nucleic acids are NOT DETECTED. The SARS-CoV-2 RNA is generally detectable in upper and lower  respiratory specimens during the acute phase of infection. The lowest  concentration of SARS-CoV-2 viral copies this assay can detect is 250  copies / mL. A negative result does not preclude SARS-CoV-2 infection  and should not be used as the sole basis for treatment or other  patient management decisions.  A negative result may occur with  improper specimen collection / handling, submission of specimen other  than nasopharyngeal swab, presence of viral mutation(s) within the  areas targeted by this assay, and inadequate number of viral copies  (<250 copies / mL). A negative result must be combined with clinical  observations, patient history, and epidemiological information. If result is POSITIVE SARS-CoV-2 target nucleic acids are DETECTED. The SARS-CoV-2 RNA is generally detectable in upper and lower  respiratory specimens dur  ing the acute phase of infection.  Positive   results are indicative of active infection with SARS-CoV-2.  Clinical  correlation with patient history and other diagnostic information is  necessary to determine patient infection status.  Positive results do  not rule out bacterial infection or co-infection with other viruses. If result is PRESUMPTIVE POSTIVE SARS-CoV-2 nucleic acids MAY BE PRESENT.   A presumptive positive result was obtained on the submitted specimen  and confirmed on repeat testing.  While 2019 novel coronavirus  (SARS-CoV-2) nucleic acids may be present in the submitted sample  additional confirmatory testing may be necessary for epidemiological  and / or clinical management purposes  to differentiate between  SARS-CoV-2 and other Sarbecovirus currently known to infect humans.  If clinically indicated additional testing with an alternate test  methodology [id number]) is advised. The SARS-CoV-2 RNA is generally  detectable in upper and lower respiratory sp ecimens during the acute  phase of infection. The expected result is Negative. Fact Sheet for Patients:  StrictlyIdeas.no Fact Sheet for Healthcare Providers: BankingDealers.co.za This test is not yet approved or cleared by the [place] FDA and has been authorized for detection and/or diagnosis of SARS-CoV-2 by FDA under an Emergency Use Authorization (EUA).  This EUA will remain in effect (meaning this test can be used) for the duration of the COVID-19 declaration under Section 564(b)(1) of the Act, 21 U.S.C. section 360bbb-3(b)(1), unless the authorization is terminated or revoked sooner. Performed at [facility], [place] [address], [place], [place] [address]   MRSA PCR Screening     Status: None   Collection Time: 03/15/2019  4:15 PM   Specimen: Nasal Mucosa; Nasopharyngeal  Result Value Ref Range Status   MRSA by PCR NEGATIVE NEGATIVE Final    Comment:        The GeneXpert MRSA Assay (FDA approved  for NASAL specimens only), is one component of a comprehensive MRSA colonization surveillance program. It is not intended to diagnose MRSA infection nor to guide or monitor treatment for MRSA infections. Performed at [facility], [place] [address] [place], [place] [address]     Anti-infectives:  Anti-infectives (From admission, onward)   Start     Dose/Rate Route Frequency Ordered Stop   03/03/2019 1100  ceFAZolin (ANCEF) IVPB 2g/100 mL premix     2 g 200 mL/hr over 30 Minutes Intravenous  Once 03/07/2019 1057 03/12/2019 1200      Best Practice/Protocols:  VTE Prophylaxis: Mechanical Continous Sedation  Consults: Treatment Team:  Eustace Moore, MD    Events:  Chief Complaint/Subjective:    Overnight Issues: Patient unresponsive - no basic reflexes - GCS 3 ICP monitor replaced by Neurosurgery Repeat head CT - appears slightly improved, not congruent with neurologic exam  Objective:  Vital signs for last 24 hours: Temp:  [96.9 F (36.1 C)-99.5 F (37.5 C)] 96.9 F (36.1 C) (10/31 0800) Pulse Rate:  [67-103] 67 (10/31 0819) Resp:  [14-32] 32 (10/31 0819) BP: (85-149)/(58-92) 111/76 (10/31 1000) SpO2:  [93 %-100 %] 99 % (10/31 0819) Arterial Line BP: (68-167)/(42-79) 131/70 (10/31 0600) FiO2 (%):  [40 %-100 %] 40 % (10/31 0819) Weight:  [76.7 kg] 76.7 kg (10/30 1545)  Hemodynamic parameters for last 24 hours:    Intake/Output from previous day: 10/30 0701 - 10/31 0700 In: 2480.9 [I.V.:2380.9; IV Piggyback:100] Out: 9798 [Urine:885]  Intake/Output this shift: Total I/O In: 550 [I.V.:450; IV Piggyback:100] Out: -   Vent settings for last 24 hours: Vent Mode:  PRVC FiO2 (%):  [40 %-100 %] 40 % Set Rate:  [18 bmp-32 bmp] 32 bmp Vt Set:  [560 mL] 560 mL PEEP:  [5 cmH20-8 cmH20] 8 cmH20 Plateau Pressure:  [17 cmH20-26 cmH20] 22 cmH20  Physical Exam:  Gen: Intubated, GCS 3 HEENT: ICP monitor; left forehead hematoma Resp: CTA B;  Cardiovascular: RRR,   Abdomen: soft, non-tender; renal transplant incision B knee abrasions Neuro: GCS 3  Results for orders placed or performed during the hospital encounter of 03/21/2019 (from the past 24 hour(s))  I-STAT 7, (LYTES, BLD GAS, ICA, H+H)     Status: Abnormal   Collection Time: 02/26/2019 11:33 AM  Result Value Ref Range   pH, Arterial 7.284 (L) 7.350 - 7.450   pCO2 arterial 43.9 32.0 - 48.0 mmHg   pO2, Arterial 76.0 (L) 83.0 - 108.0 mmHg   Bicarbonate 20.8 20.0 - 28.0 mmol/L   TCO2 22 22 - 32 mmol/L   O2 Saturation 93.0 %   Acid-base deficit 6.0 (H) 0.0 - 2.0 mmol/L   Sodium 130 (L) 135 - 145 mmol/L   Potassium 3.1 (L) 3.5 - 5.1 mmol/L   Calcium, Ion 1.02 (L) 1.15 - 1.40 mmol/L   HCT 25.0 (L) 39.0 - 52.0 %   Hemoglobin 8.5 (L) 13.0 - 17.0 g/dL   Patient temperature HIDE    Collection site RADIAL, ALLEN'S TEST ACCEPTABLE    Drawn by RT    Sample type ARTERIAL   Sample to Blood Bank     Status: None   Collection Time: 02/26/2019 11:40 AM  Result Value Ref Range   Blood Bank Specimen SAMPLE AVAILABLE FOR TESTING    Sample Expiration      03/24/2019,2359 Performed at [facility], [address] [place], [place] [address]   CBG monitoring, ED     Status: Abnormal   Collection Time: 02/25/2019  1:21 PM  Result Value Ref Range   Glucose-Capillary 554 (HH) 70 - 99 mg/dL   Comment 1 Notify RN    Comment 2 Document in Chart   CBG monitoring, ED     Status: Abnormal   Collection Time: 03/09/2019  2:21 PM  Result Value Ref Range   Glucose-Capillary 518 (HH) 70 - 99 mg/dL   Comment 1 Notify RN    Comment 2 Document in Chart   Glucose, capillary     Status: Abnormal   Collection Time: 03/11/2019  3:57 PM  Result Value Ref Range   Glucose-Capillary 329 (H) 70 - 99 mg/dL  MRSA PCR Screening     Status: None   Collection Time: 03/15/2019  4:15 PM   Specimen: Nasal Mucosa; Nasopharyngeal  Result Value Ref Range   MRSA by PCR NEGATIVE NEGATIVE  Urinalysis, Routine w reflex microscopic      Status: Abnormal   Collection Time: 03/11/2019  4:21 PM  Result Value Ref Range   Color, Urine YELLOW YELLOW   APPearance CLEAR CLEAR   Specific Gravity, Urine 1.022 1.005 - 1.030   pH 5.0 5.0 - 8.0   Glucose, UA >=500 (A) NEGATIVE mg/dL   Hgb urine dipstick MODERATE (A) NEGATIVE   Bilirubin Urine NEGATIVE NEGATIVE   Ketones, ur NEGATIVE NEGATIVE mg/dL   Protein, ur 100 (A) NEGATIVE mg/dL   Nitrite NEGATIVE NEGATIVE   Leukocytes,Ua NEGATIVE NEGATIVE   RBC / HPF 0-5 0 - 5 RBC/hpf   WBC, UA 0-5 0 - 5 WBC/hpf   Bacteria, UA NONE SEEN NONE SEEN   Squamous Epithelial /  LPF 0-5 0 - 5  Glucose, capillary     Status: Abnormal   Collection Time: 03/06/2019  5:00 PM  Result Value Ref Range   Glucose-Capillary 264 (H) 70 - 99 mg/dL  I-STAT 7, (LYTES, BLD GAS, ICA, H+H)     Status: Abnormal   Collection Time: 02/25/2019  5:36 PM  Result Value Ref Range   pH, Arterial 7.258 (L) 7.350 - 7.450   pCO2 arterial 41.7 32.0 - 48.0 mmHg   pO2, Arterial 61.0 (L) 83.0 - 108.0 mmHg   Bicarbonate 18.6 (L) 20.0 - 28.0 mmol/L   TCO2 20 (L) 22 - 32 mmol/L   O2 Saturation 87.0 %   Acid-base deficit 8.0 (H) 0.0 - 2.0 mmol/L   Sodium 140 135 - 145 mmol/L   Potassium 2.5 (LL) 3.5 - 5.1 mmol/L   Calcium, Ion 1.09 (L) 1.15 - 1.40 mmol/L   HCT 27.0 (L) 39.0 - 52.0 %   Hemoglobin 9.2 (L) 13.0 - 17.0 g/dL   Patient temperature HIDE    Collection site RADIAL, ALLEN'S TEST ACCEPTABLE    Drawn by VP    Sample type ARTERIAL    Comment NOTIFIED PHYSICIAN   Glucose, capillary     Status: Abnormal   Collection Time: 02/22/2019  5:51 PM  Result Value Ref Range   Glucose-Capillary 219 (H) 70 - 99 mg/dL   Comment 1 Glucose Stabilizer   HIV Antibody (routine testing w rflx)     Status: None   Collection Time: 03/24/2019  5:56 PM  Result Value Ref Range   HIV Screen 4th Generation wRfx NON REACTIVE NON REACTIVE  Glucose, capillary     Status: Abnormal   Collection Time: 03/16/2019  7:02 PM  Result Value Ref Range    Glucose-Capillary 143 (H) 70 - 99 mg/dL   Comment 1 Glucose Stabilizer   Glucose, capillary     Status: None   Collection Time: 03/15/2019  8:03 PM  Result Value Ref Range   Glucose-Capillary 90 70 - 99 mg/dL  Sodium     Status: None   Collection Time: 03/16/2019  8:37 PM  Result Value Ref Range   Sodium 139 135 - 145 mmol/L  I-STAT 7, (LYTES, BLD GAS, ICA, H+H)     Status: Abnormal   Collection Time: 03/01/2019  8:41 PM  Result Value Ref Range   pH, Arterial 7.379 7.350 - 7.450   pCO2 arterial 31.0 (L) 32.0 - 48.0 mmHg   pO2, Arterial 87.0 83.0 - 108.0 mmHg   Bicarbonate 18.2 (L) 20.0 - 28.0 mmol/L   TCO2 19 (L) 22 - 32 mmol/L   O2 Saturation 96.0 %   Acid-base deficit 6.0 (H) 0.0 - 2.0 mmol/L   Sodium 140 135 - 145 mmol/L   Potassium 2.5 (LL) 3.5 - 5.1 mmol/L   Calcium, Ion 1.06 (L) 1.15 - 1.40 mmol/L   HCT 27.0 (L) 39.0 - 52.0 %   Hemoglobin 9.2 (L) 13.0 - 17.0 g/dL   Patient temperature 99.5 F    Sample type ARTERIAL   Glucose, capillary     Status: None   Collection Time: 03/14/2019  9:04 PM  Result Value Ref Range   Glucose-Capillary 71 70 - 99 mg/dL  Glucose, capillary     Status: None   Collection Time: 02/22/2019 10:06 PM  Result Value Ref Range   Glucose-Capillary 92 70 - 99 mg/dL  Glucose, capillary     Status: Abnormal   Collection Time: 03/10/2019 11:02 PM  Result Value  Ref Range   Glucose-Capillary 118 (H) 70 - 99 mg/dL  Glucose, capillary     Status: Abnormal   Collection Time: 03/24/19 12:05 AM  Result Value Ref Range   Glucose-Capillary 156 (H) 70 - 99 mg/dL  Glucose, capillary     Status: Abnormal   Collection Time: 03/24/19  1:04 AM  Result Value Ref Range   Glucose-Capillary 184 (H) 70 - 99 mg/dL  Comprehensive metabolic panel     Status: Abnormal   Collection Time: 03/24/19  2:22 AM  Result Value Ref Range   Sodium 138 135 - 145 mmol/L   Potassium 3.2 (L) 3.5 - 5.1 mmol/L   Chloride 108 98 - 111 mmol/L   CO2 15 (L) 22 - 32 mmol/L   Glucose, Bld 191 (H)  70 - 99 mg/dL   BUN 33 (H) 6 - 20 mg/dL   Creatinine, Ser 4.19 (H) 0.61 - 1.24 mg/dL   Calcium 7.6 (L) 8.9 - 10.3 mg/dL   Total Protein 5.3 (L) 6.5 - 8.1 g/dL   Albumin 3.0 (L) 3.5 - 5.0 g/dL   AST 35 15 - 41 U/L   ALT 17 0 - 44 U/L   Alkaline Phosphatase 112 38 - 126 U/L   Total Bilirubin 1.2 0.3 - 1.2 mg/dL   GFR calc non Af Amer 15 (L) >60 mL/min   GFR calc Af Amer 17 (L) >60 mL/min   Anion gap 15 5 - 15  Triglycerides     Status: None   Collection Time: 03/24/19  2:22 AM  Result Value Ref Range   Triglycerides 100 <150 mg/dL  Glucose, capillary     Status: Abnormal   Collection Time: 03/24/19  2:25 AM  Result Value Ref Range   Glucose-Capillary 175 (H) 70 - 99 mg/dL  Glucose, capillary     Status: Abnormal   Collection Time: 03/24/19  3:01 AM  Result Value Ref Range   Glucose-Capillary 179 (H) 70 - 99 mg/dL  CBC     Status: Abnormal   Collection Time: 03/24/19  3:21 AM  Result Value Ref Range   WBC 14.1 (H) 4.0 - 10.5 K/uL   RBC 3.31 (L) 4.22 - 5.81 MIL/uL   Hemoglobin 8.9 (L) 13.0 - 17.0 g/dL   HCT 25.9 (L) 39.0 - 52.0 %   MCV 78.2 (L) 80.0 - 100.0 fL   MCH 26.9 26.0 - 34.0 pg   MCHC 34.4 30.0 - 36.0 g/dL   RDW 13.6 11.5 - 15.5 %   Platelets 224 150 - 400 K/uL   nRBC 0.0 0.0 - 0.2 %  Glucose, capillary     Status: Abnormal   Collection Time: 03/24/19  3:58 AM  Result Value Ref Range   Glucose-Capillary 169 (H) 70 - 99 mg/dL  I-STAT 7, (LYTES, BLD GAS, ICA, H+H)     Status: Abnormal   Collection Time: 03/24/19  4:13 AM  Result Value Ref Range   pH, Arterial 7.385 7.350 - 7.450   pCO2 arterial 25.3 (L) 32.0 - 48.0 mmHg   pO2, Arterial 113.0 (H) 83.0 - 108.0 mmHg   Bicarbonate 15.2 (L) 20.0 - 28.0 mmol/L   TCO2 16 (L) 22 - 32 mmol/L   O2 Saturation 99.0 %   Acid-base deficit 9.0 (H) 0.0 - 2.0 mmol/L   Sodium 139 135 - 145 mmol/L   Potassium 3.7 3.5 - 5.1 mmol/L   Calcium, Ion 1.02 (L) 1.15 - 1.40 mmol/L   HCT 29.0 (L) 39.0 - 52.0 %  Hemoglobin 9.9 (L) 13.0 -  17.0 g/dL   Patient temperature 97.8 F    Sample type CARDIOPULMONARY BYPASS   Glucose, capillary     Status: Abnormal   Collection Time: 03/24/19  4:51 AM  Result Value Ref Range   Glucose-Capillary 180 (H) 70 - 99 mg/dL  Glucose, capillary     Status: Abnormal   Collection Time: 03/24/19  5:58 AM  Result Value Ref Range   Glucose-Capillary 189 (H) 70 - 99 mg/dL  Glucose, capillary     Status: Abnormal   Collection Time: 03/24/19  7:01 AM  Result Value Ref Range   Glucose-Capillary 207 (H) 70 - 99 mg/dL  Glucose, capillary     Status: Abnormal   Collection Time: 03/24/19  8:01 AM  Result Value Ref Range   Glucose-Capillary 186 (H) 70 - 99 mg/dL  Glucose, capillary     Status: Abnormal   Collection Time: 03/24/19  9:10 AM  Result Value Ref Range   Glucose-Capillary 160 (H) 70 - 99 mg/dL  Glucose, capillary     Status: Abnormal   Collection Time: 03/24/19 10:14 AM  Result Value Ref Range   Glucose-Capillary 129 (H) 70 - 99 mg/dL     Assessment/Plan:  SDH/SAH with partial R ventricular effacement and mild midline shift - c/s NSGY, ICP monitor placed with medical management of elevated ICPs. Sedation with versed due to hypotension, but transition back to propofol when BP will tolerate to avoid build-up of metabolites from versed. Keppra for seizure ppx.   Hypotension - vasopressors as needed to maintain MAP and CPP. Vent dependent respiratory failure - currently hyperventilated, will wean rate, PEEP C7 R articular process fracture - appreciate NSGY recs, c-collar to remain for now DVT - hold ppx  FEN - NGT in place, NPO, PPI Poor prognosis   LOS: 1 day   Additional comments:I reviewed the patient's new clinical lab test results. cbc,bmet, ABG and I reviewed the patients new imaging test results. CXR - lungs fields clear  Critical Care Total Time*: 30 Minutes  Maia Petties 03/24/2019  *Care during the described time interval was provided by me and/or other providers on  the critical care team.  I have reviewed this patient's available data, including medical history, events of note, physical examination and test results as part of my evaluation.

## 2019-03-24 NOTE — Progress Notes (Signed)
Patient stopped producing urine (75cc this shift) and ICP's are consistently higher (20-25) than the start of the shift.   Neurosurgery paged and made aware: Verbal order to call neurosurgery if ICP is sustained above 25 for five minutes.   Nephrology paged and made aware: Verbal response that lasix may be ordered, awaiting orders to be placed.

## 2019-03-25 ENCOUNTER — Inpatient Hospital Stay (HOSPITAL_COMMUNITY): Payer: Medicare Other

## 2019-03-25 LAB — RENAL FUNCTION PANEL
Albumin: 2.4 g/dL — ABNORMAL LOW (ref 3.5–5.0)
Anion gap: 13 (ref 5–15)
BUN: 52 mg/dL — ABNORMAL HIGH (ref 6–20)
CO2: 15 mmol/L — ABNORMAL LOW (ref 22–32)
Calcium: 7.2 mg/dL — ABNORMAL LOW (ref 8.9–10.3)
Chloride: 117 mmol/L — ABNORMAL HIGH (ref 98–111)
Creatinine, Ser: 6.8 mg/dL — ABNORMAL HIGH (ref 0.61–1.24)
GFR calc Af Amer: 10 mL/min — ABNORMAL LOW (ref >60)
GFR calc non Af Amer: 8 mL/min — ABNORMAL LOW (ref >60)
Glucose, Bld: 188 mg/dL — ABNORMAL HIGH (ref 70–99)
Phosphorus: 4.9 mg/dL — ABNORMAL HIGH (ref 2.5–4.6)
Potassium: 4.2 mmol/L (ref 3.5–5.1)
Sodium: 145 mmol/L (ref 135–145)

## 2019-03-25 LAB — BASIC METABOLIC PANEL
Anion gap: 15 (ref 5–15)
BUN: 47 mg/dL — ABNORMAL HIGH (ref 6–20)
CO2: 16 mmol/L — ABNORMAL LOW (ref 22–32)
Calcium: 7.2 mg/dL — ABNORMAL LOW (ref 8.9–10.3)
Chloride: 113 mmol/L — ABNORMAL HIGH (ref 98–111)
Creatinine, Ser: 6.25 mg/dL — ABNORMAL HIGH (ref 0.61–1.24)
GFR calc Af Amer: 11 mL/min — ABNORMAL LOW (ref >60)
GFR calc non Af Amer: 9 mL/min — ABNORMAL LOW (ref >60)
Glucose, Bld: 203 mg/dL — ABNORMAL HIGH (ref 70–99)
Potassium: 3.9 mmol/L (ref 3.5–5.1)
Sodium: 144 mmol/L (ref 135–145)

## 2019-03-25 LAB — URINALYSIS, ROUTINE W REFLEX MICROSCOPIC
Bilirubin Urine: NEGATIVE
Glucose, UA: NEGATIVE mg/dL
Ketones, ur: NEGATIVE mg/dL
Leukocytes,Ua: NEGATIVE
Nitrite: NEGATIVE
Protein, ur: 100 mg/dL — AB
Specific Gravity, Urine: 1.02 (ref 1.005–1.030)
pH: 5 (ref 5.0–8.0)

## 2019-03-25 LAB — GLUCOSE, CAPILLARY
Glucose-Capillary: 147 mg/dL — ABNORMAL HIGH (ref 70–99)
Glucose-Capillary: 186 mg/dL — ABNORMAL HIGH (ref 70–99)
Glucose-Capillary: 181 mg/dL — ABNORMAL HIGH (ref 70–99)
Glucose-Capillary: 179 mg/dL — ABNORMAL HIGH (ref 70–99)
Glucose-Capillary: 153 mg/dL — ABNORMAL HIGH (ref 70–99)
Glucose-Capillary: 106 mg/dL — ABNORMAL HIGH (ref 70–99)
Glucose-Capillary: 108 mg/dL — ABNORMAL HIGH (ref 70–99)

## 2019-03-25 LAB — CBC
HCT: 22.2 % — ABNORMAL LOW (ref 39.0–52.0)
Hemoglobin: 7.3 g/dL — ABNORMAL LOW (ref 13.0–17.0)
MCH: 27 pg (ref 26.0–34.0)
MCHC: 32.9 g/dL (ref 30.0–36.0)
MCV: 82.2 fL (ref 80.0–100.0)
Platelets: 174 10*3/uL (ref 150–400)
RBC: 2.7 MIL/uL — ABNORMAL LOW (ref 4.22–5.81)
RDW: 15.3 % (ref 11.5–15.5)
WBC: 14.3 10*3/uL — ABNORMAL HIGH (ref 4.0–10.5)
nRBC: 0 % (ref 0.0–0.2)

## 2019-03-25 LAB — SODIUM
Sodium: 142 mmol/L (ref 135–145)
Sodium: 145 mmol/L (ref 135–145)

## 2019-03-25 LAB — URINALYSIS, MICROSCOPIC (REFLEX)

## 2019-03-25 LAB — PHOSPHORUS: Phosphorus: 4.3 mg/dL (ref 2.5–4.6)

## 2019-03-25 LAB — TRIGLYCERIDES: Triglycerides: 181 mg/dL — ABNORMAL HIGH (ref <150)

## 2019-03-25 LAB — MAGNESIUM: Magnesium: 1.2 mg/dL — ABNORMAL LOW (ref 1.7–2.4)

## 2019-03-25 MED ORDER — HEPARIN SODIUM (PORCINE) 1000 UNIT/ML DIALYSIS
1000.0000 [IU] | INTRAMUSCULAR | Status: DC | PRN
  Administered 2019-03-25: 21:00:00 3000 [IU] via INTRAVENOUS_CENTRAL
  Filled 2019-03-25 (×3): qty 6

## 2019-03-25 MED ORDER — PRISMASOL BGK 4/2.5 32-4-2.5 MEQ/L REPLACEMENT SOLN
Status: DC
  Administered 2019-03-25 – 2019-03-26 (×2): via INTRAVENOUS_CENTRAL
  Filled 2019-03-25 (×4): qty 5000

## 2019-03-25 MED ORDER — MANNITOL 25 % IV SOLN
25.0000 g | Freq: Once | Status: AC
  Administered 2019-03-25: 25 g via INTRAVENOUS

## 2019-03-25 MED ORDER — PRISMASOL BGK 4/2.5 32-4-2.5 MEQ/L IV SOLN
INTRAVENOUS | Status: DC
  Administered 2019-03-25 – 2019-03-26 (×6): via INTRAVENOUS_CENTRAL
  Filled 2019-03-25 (×15): qty 5000

## 2019-03-25 MED ORDER — SODIUM CHLORIDE 0.9 % FOR CRRT
INTRAVENOUS_CENTRAL | Status: DC | PRN
  Administered 2019-03-25: 17:00:00 via INTRAVENOUS_CENTRAL
  Filled 2019-03-25 (×2): qty 1000

## 2019-03-25 MED ORDER — MANNITOL 25 % IV SOLN
INTRAVENOUS | Status: AC
  Filled 2019-03-25: qty 50

## 2019-03-25 MED ORDER — ATROPINE SULFATE 1 MG/10ML IJ SOSY
PREFILLED_SYRINGE | INTRAMUSCULAR | Status: AC
  Filled 2019-03-25: qty 10

## 2019-03-25 NOTE — Progress Notes (Signed)
Patient ID: Austin Murphy, male   DOB: [birth year], 53 y.o.   MRN: [id number] Head CT showed generalized edema. Coupled with the change in his exam, and the lack of a mass lesion to remove, I do not believe this is survivable. He will continue to receive maximal treatment.
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
## 2019-03-25 NOTE — Progress Notes (Signed)
Patient ID: Austin Murphy, male   DOB: Aug 14, 1965, 53 y.o.   MRN: [id number] BP 107/72 (BP Location: Left Arm)   Pulse (!) 59   Temp (!) 97.1 F (36.2 C) (Axillary) Comment: blood warmer applied to CRRT  Resp 20   Ht 5\' 9"  (1.753 m)   Wt 76.7 kg   SpO2 100%   BMI 24.97 kg/m  Intubated, sedated Right pupil fixed dilated~40mm, left pupil reactive No cough, no gag Have given orders for mannitol. Yesterday's CT shows no mass lesion Generalized cerebral edema most likely etiology. [place] reading above 40. icp has been elevated since crrt initiated. But was holding steady approximately 58mm.  Will send for ct. Prognosis remains grave.

## 2019-03-25 NOTE — Progress Notes (Signed)
Patient transported to CT and back with no complications.

## 2019-03-25 NOTE — Progress Notes (Signed)
Paged on call NS MD regarding patient's consistently increased ICP.  MD stated that he was going come in and assess the patient himself.
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
## 2019-03-25 NOTE — Procedures (Signed)
Central Venous Catheter Insertion Procedure Note Austin Murphy [id number] [birth year]  Procedure: Insertion of Central Venous Catheter Indications: dialysis access  Procedure Details Consent: Risks of procedure as well as the alternatives and risks of each were explained to the (patient/caregiver).  Consent for procedure obtained. Time Out: Verified patient identification, verified procedure, site/side was marked, verified correct patient position, special equipment/implants available, medications/allergies/relevent history reviewed, required imaging and test results available.  Performed  Maximum sterile technique was used including antiseptics, cap, gloves, gown, hand hygiene, mask and sheet. Skin prep: Chlorhexidine; local anesthetic administered. [place] used to identify right internal jugular vein and carotid artery. Needle used to access vein under US guidance. An antimicrobial bonded/coated triple lumen catheter was placed in the right internal jugular vein using the Seldinger technique.  Evaluation Blood flow good Complications: No apparent complications Patient did tolerate procedure well. Chest X-ray ordered to verify placement.  CXR: pending.  Austin Murphy 03/25/2019, 1:16 PM
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
## 2019-03-25 NOTE — Progress Notes (Signed)
Paged Trauma MD regarding increasing ICP (ICP=60).  No new orders given.  Family aware of patient's declining status.

## 2019-03-25 NOTE — Progress Notes (Signed)
Spoke with pharmacy regarding prograf. Unable to modify order to give per tube. Pharmacy stated it would be fine to open capsules and give per tube and will see about oral suspension being made when next shift comes in.

## 2019-03-25 NOTE — Progress Notes (Signed)
ICP 13-15 with a good waveform, no response to deep noxious stimuli, pupils sluggishly reactive.  He has stopped making urine.  He is now on Levophed.  Prognosis remains poor.

## 2019-03-25 NOTE — Progress Notes (Signed)
ICP's increased (25+) after initiation of CRRT.  Neurosurgery paged and made aware, see new orders for 3%. CPP 64 at time of page, now 50. Will continue to monitor.

## 2019-03-25 NOTE — Progress Notes (Signed)
Updated on-call neuro-surg regarding ICP levels remaining 18-19, Na remaining low 140's and no urine output. No new changes/orders.

## 2019-03-25 DEATH — deceased

## 2019-03-26 DIAGNOSIS — Z9911 Dependence on respirator [ventilator] status: Secondary | ICD-10-CM

## 2019-03-26 DIAGNOSIS — I6200 Nontraumatic subdural hemorrhage, unspecified: Secondary | ICD-10-CM

## 2019-03-26 DIAGNOSIS — Z7189 Other specified counseling: Secondary | ICD-10-CM

## 2019-03-26 DIAGNOSIS — Z515 Encounter for palliative care: Secondary | ICD-10-CM

## 2019-03-26 LAB — CBC WITH DIFFERENTIAL/PLATELET
Abs Immature Granulocytes: 0 10*3/uL (ref 0.00–0.07)
Basophils Absolute: 0.1 10*3/uL (ref 0.0–0.1)
Basophils Relative: 1 %
Eosinophils Absolute: 0.1 10*3/uL (ref 0.0–0.5)
Eosinophils Relative: 1 %
HCT: 21.8 % — ABNORMAL LOW (ref 39.0–52.0)
Hemoglobin: 7.2 g/dL — ABNORMAL LOW (ref 13.0–17.0)
Lymphocytes Relative: 8 %
Lymphs Abs: 0.7 10*3/uL (ref 0.7–4.0)
MCH: 26.5 pg (ref 26.0–34.0)
MCHC: 33 g/dL (ref 30.0–36.0)
MCV: 80.1 fL (ref 80.0–100.0)
Monocytes Absolute: 0 10*3/uL — ABNORMAL LOW (ref 0.1–1.0)
Monocytes Relative: 0 %
Neutro Abs: 7.7 10*3/uL (ref 1.7–7.7)
Neutrophils Relative %: 90 %
Platelets: 131 10*3/uL — ABNORMAL LOW (ref 150–400)
RBC: 2.72 MIL/uL — ABNORMAL LOW (ref 4.22–5.81)
RDW: 15.7 % — ABNORMAL HIGH (ref 11.5–15.5)
WBC: 8.5 10*3/uL (ref 4.0–10.5)
nRBC: 0 % (ref 0.0–0.2)
nRBC: 0 /100 WBC

## 2019-03-26 LAB — GLUCOSE, CAPILLARY
Glucose-Capillary: 99 mg/dL (ref 70–99)
Glucose-Capillary: 130 mg/dL — ABNORMAL HIGH (ref 70–99)
Glucose-Capillary: 154 mg/dL — ABNORMAL HIGH (ref 70–99)
Glucose-Capillary: 289 mg/dL — ABNORMAL HIGH (ref 70–99)
Glucose-Capillary: 311 mg/dL — ABNORMAL HIGH (ref 70–99)

## 2019-03-26 LAB — RENAL FUNCTION PANEL
Albumin: 2.4 g/dL — ABNORMAL LOW (ref 3.5–5.0)
Anion gap: 10 (ref 5–15)
BUN: 30 mg/dL — ABNORMAL HIGH (ref 6–20)
CO2: 20 mmol/L — ABNORMAL LOW (ref 22–32)
Calcium: 7.3 mg/dL — ABNORMAL LOW (ref 8.9–10.3)
Chloride: 113 mmol/L — ABNORMAL HIGH (ref 98–111)
Creatinine, Ser: 3.74 mg/dL — ABNORMAL HIGH (ref 0.61–1.24)
GFR calc Af Amer: 20 mL/min — ABNORMAL LOW (ref >60)
GFR calc non Af Amer: 17 mL/min — ABNORMAL LOW (ref >60)
Glucose, Bld: 100 mg/dL — ABNORMAL HIGH (ref 70–99)
Phosphorus: 2.6 mg/dL (ref 2.5–4.6)
Potassium: 3.7 mmol/L (ref 3.5–5.1)
Sodium: 143 mmol/L (ref 135–145)

## 2019-03-26 LAB — ABO/RH
ABO/RH(D): O NEG
Weak D: NEGATIVE

## 2019-03-26 LAB — COMPREHENSIVE METABOLIC PANEL
ALT: 25 U/L (ref 0–44)
AST: 100 U/L — ABNORMAL HIGH (ref 15–41)
Albumin: 2.5 g/dL — ABNORMAL LOW (ref 3.5–5.0)
Alkaline Phosphatase: 111 U/L (ref 38–126)
Anion gap: 12 (ref 5–15)
BUN: 32 mg/dL — ABNORMAL HIGH (ref 6–20)
CO2: 20 mmol/L — ABNORMAL LOW (ref 22–32)
Calcium: 7.5 mg/dL — ABNORMAL LOW (ref 8.9–10.3)
Chloride: 115 mmol/L — ABNORMAL HIGH (ref 98–111)
Creatinine, Ser: 4.03 mg/dL — ABNORMAL HIGH (ref 0.61–1.24)
GFR calc Af Amer: 18 mL/min — ABNORMAL LOW (ref >60)
GFR calc non Af Amer: 16 mL/min — ABNORMAL LOW (ref >60)
Glucose, Bld: 183 mg/dL — ABNORMAL HIGH (ref 70–99)
Potassium: 3.9 mmol/L (ref 3.5–5.1)
Sodium: 147 mmol/L — ABNORMAL HIGH (ref 135–145)
Total Bilirubin: 1.3 mg/dL — ABNORMAL HIGH (ref 0.3–1.2)
Total Protein: 5.2 g/dL — ABNORMAL LOW (ref 6.5–8.1)

## 2019-03-26 LAB — PROTIME-INR
INR: 1.3 — ABNORMAL HIGH (ref 0.8–1.2)
Prothrombin Time: 15.6 seconds — ABNORMAL HIGH (ref 11.4–15.2)

## 2019-03-26 LAB — SODIUM
Sodium: 145 mmol/L (ref 135–145)
Sodium: 146 mmol/L — ABNORMAL HIGH (ref 135–145)

## 2019-03-26 LAB — PHOSPHORUS: Phosphorus: 2.5 mg/dL (ref 2.5–4.6)

## 2019-03-26 LAB — BILIRUBIN, DIRECT: Bilirubin, Direct: 0.4 mg/dL — ABNORMAL HIGH (ref 0.0–0.2)

## 2019-03-26 LAB — SARS CORONAVIRUS 2 BY RT PCR (HOSPITAL ORDER, PERFORMED IN ~~LOC~~ HOSPITAL LAB): SARS Coronavirus 2: NEGATIVE

## 2019-03-26 LAB — MAGNESIUM: Magnesium: 1.9 mg/dL (ref 1.7–2.4)

## 2019-03-26 MED ORDER — EPINEPHRINE HCL 5 MG/250ML IV SOLN IN NS
0.5000 ug/min | INTRAVENOUS | Status: DC
  Filled 2019-03-26: qty 250

## 2019-03-26 MED ORDER — MYCOPHENOLATE 200 MG/ML ORAL SUSPENSION: 500.0000 mg | Freq: Two times a day (BID) | ORAL | Status: DC

## 2019-03-26 MED ORDER — ACETAMINOPHEN 650 MG RE SUPP: 650.0000 mg | Freq: Four times a day (QID) | RECTAL | Status: DC | PRN

## 2019-03-26 MED ORDER — INSULIN DETEMIR 100 UNIT/ML ~~LOC~~ SOLN
10.0000 [IU] | Freq: Two times a day (BID) | SUBCUTANEOUS | Status: DC
  Administered 2019-03-26 – 2019-03-27 (×2): 10 [IU] via SUBCUTANEOUS
  Filled 2019-03-26 (×5): qty 0.1

## 2019-03-26 MED ORDER — EPINEPHRINE HCL 5 MG/250ML IV SOLN IN NS
0.5000 ug/min | INTRAVENOUS | Status: DC
  Administered 2019-03-26 (×2): 0.5 ug/min via INTRAVENOUS

## 2019-03-26 MED ORDER — CHLORHEXIDINE GLUCONATE 0.12% ORAL RINSE (MEDLINE KIT)
15.0000 mL | Freq: Two times a day (BID) | OROMUCOSAL | Status: DC
  Administered 2019-03-26 – 2019-03-27 (×2): 15 mL via OROMUCOSAL

## 2019-03-26 MED ORDER — INSULIN ASPART 100 UNIT/ML ~~LOC~~ SOLN
1.0000 [IU] | SUBCUTANEOUS | Status: DC
  Administered 2019-03-26: 2 [IU] via SUBCUTANEOUS

## 2019-03-26 MED ORDER — LORAZEPAM 2 MG/ML IJ SOLN
1.0000 mg | INTRAMUSCULAR | Status: DC | PRN
  Filled 2019-03-26: qty 1

## 2019-03-26 MED ORDER — TACROLIMUS 1 MG PO CAPS: 8.0000 mg | ORAL_CAPSULE | Freq: Two times a day (BID) | ORAL | Status: DC

## 2019-03-26 MED ORDER — NOREPINEPHRINE 4 MG/250ML-% IV SOLN
0.0000 ug/min | INTRAVENOUS | Status: DC
  Administered 2019-03-26: 11 ug/min via INTRAVENOUS
  Administered 2019-03-26: 10 ug/min via INTRAVENOUS
  Administered 2019-03-27: 18 ug/min via INTRAVENOUS
  Administered 2019-03-27: 20 ug/min via INTRAVENOUS
  Administered 2019-03-27: 28 ug/min via INTRAVENOUS
  Administered 2019-03-27: 24 ug/min via INTRAVENOUS
  Administered 2019-03-27: 20 ug/min via INTRAVENOUS
  Filled 2019-03-26 (×2): qty 250
  Filled 2019-03-26: qty 500
  Filled 2019-03-26 (×3): qty 250

## 2019-03-26 MED ORDER — INSULIN ASPART 100 UNIT/ML ~~LOC~~ SOLN
0.0000 [IU] | Freq: Four times a day (QID) | SUBCUTANEOUS | Status: DC
  Administered 2019-03-26: 11 [IU] via SUBCUTANEOUS
  Administered 2019-03-27: 3 [IU] via SUBCUTANEOUS

## 2019-03-26 MED ORDER — ATROPINE SULFATE 1 MG/10ML IJ SOSY
PREFILLED_SYRINGE | INTRAMUSCULAR | Status: AC
  Filled 2019-03-26: qty 10

## 2019-03-26 MED ORDER — INSULIN ASPART 100 UNIT/ML ~~LOC~~ SOLN: 0.0000 [IU] | Freq: Three times a day (TID) | SUBCUTANEOUS | Status: DC

## 2019-03-26 MED ORDER — ORAL CARE MOUTH RINSE
15.0000 mL | OROMUCOSAL | Status: DC
  Administered 2019-03-26 – 2019-03-27 (×9): 15 mL via OROMUCOSAL

## 2019-03-26 MED ORDER — POLYVINYL ALCOHOL 1.4 % OP SOLN
1.0000 [drp] | Freq: Four times a day (QID) | OPHTHALMIC | Status: DC | PRN
  Filled 2019-03-26: qty 15

## 2019-03-26 MED ORDER — EPINEPHRINE 1 MG/10ML IJ SOSY
PREFILLED_SYRINGE | INTRAMUSCULAR | Status: AC
  Administered 2019-03-26: 18:00:00
  Filled 2019-03-26: qty 10

## 2019-03-26 MED ORDER — GLYCOPYRROLATE 0.2 MG/ML IJ SOLN: 0.3000 mg | INTRAMUSCULAR | Status: DC | PRN

## 2019-03-26 MED ORDER — BIOTENE DRY MOUTH MT LIQD: 15.0000 mL | OROMUCOSAL | Status: DC | PRN

## 2019-03-26 MED FILL — Medication: Qty: 1 | Status: AC

## 2019-03-26 NOTE — Progress Notes (Signed)
When I arrived to the room they had just stopped chest compressions from running a code.  They did have him back.  Events of last evening and CT scan of the head reviewed.  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  prognosis is grim for functional recovery.  He should probably be DNR.

## 2019-03-26 NOTE — Progress Notes (Signed)
Patient's family has opted for comfort care at this time.

## 2019-03-26 NOTE — Progress Notes (Signed)
Trauma MD Lovick paged about patient's CBG of 311. New orders received to change sliding scale insulin administration to every 6 hours. Will continue to monitor.

## 2019-03-26 NOTE — Progress Notes (Deleted)
CODE INITIATED : Patient in PEA

## 2019-03-26 NOTE — Progress Notes (Signed)
25 ml of Versed and 150 ml Fentanyl Citrate wasted in sink with TK, RN

## 2019-03-26 NOTE — Progress Notes (Signed)
Medical examiner, Aldona Bar, called and made aware of patient becoming an organ donor.

## 2019-03-26 NOTE — Progress Notes (Signed)
Patient went into PEA, HR 29  0718 CPR initiated  0720 Pulse check, no pulse 0721 1mg  Epinephrine given  0722 pulse check, pulse found
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
## 2019-03-26 NOTE — Progress Notes (Signed)
Lengthy conversation held with family at bedside and the patient's two brothers, who are his decision-makers providing update of clinical status and a detailed explanation of the gravity of his condition. Explained the patient's poor neurological state, unlikely recovery to any meaningful level of function, and extremely high likelihood of another code event. Given the patient's near-zero expectation of meaningful neurologic recovery, we discussed the futility of additional CPR efforts and the family verbalized understanding. They wish to transition to DNR status at this time to avoid additional trauma. At this time, we will continue current medical management without escalation of current care. We will hold CRRT for now. The family was provided with the expectation that death is imminent within the next 24 to 48 hours. Palliative care has already been consulted and the chaplain was also called.   Jesusita Oka, MD General and [facility]

## 2019-03-26 NOTE — Progress Notes (Signed)
Chaplain provided spiritual presence and prayer to [name], his niece and her mother at the bedside.  Chaplain asked how they were processing the information that had been given by the doctor, and they stated that other family seemed to be taking it hard in understanding [name] prognosis.  They noted how hard it is for this to not have been a chronic illness, but for him to have been here fine and ok one day and then the next experiencing something totally different.    Chaplain provided clarity around how many family members would be allowed in the room if any decision was being made today surrounding comfort care.  Chaplain let family know that she is here all day and can come back when other family has arrived.    Please page chaplain as needed.

## 2019-03-26 NOTE — Progress Notes (Signed)
Brief Nephrology note:  Chart reviewed and discussed with ICU nurse and Dr. Bobbye Morton. Patient had a cardiac arrest this morning.  Patient is currently DNR and possibly comfort care.  Noted the CRRT was discontinued and no further measures planned.  Family at bedside. Patient with poor prognosis. Hospice and comfort care I will sign off, call back with question.  Lawson Radar, MD Nephrology

## 2019-03-26 NOTE — Progress Notes (Signed)
RN paged on call Trauma MD and alerted them to steadily decreasing heart rate (HR=36).  No new orders given.  Will continue to observe.

## 2019-03-26 NOTE — Progress Notes (Signed)
Paged Trauma MD Lovick around 2153 to verify that she wants insulin aspart sliding scale administration orders as 3 times daily with meals instead of every 4 hours. Awaiting call back. Will continue to monitor.
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
## 2019-03-26 NOTE — Progress Notes (Signed)
Contacted Trauma MD Lovick about patient's blood sugar. Last CBG result was in the 280s, and the then sliding scale order gave orders for up to 250. Above 250 called for insulin drip. Dr. Bobbye Morton said to hold the drip and we will change sliding scale orders. Will continue to monitor.

## 2019-03-27 LAB — GLUCOSE, CAPILLARY
Glucose-Capillary: 204 mg/dL — ABNORMAL HIGH (ref 70–99)
Glucose-Capillary: 161 mg/dL — ABNORMAL HIGH (ref 70–99)
Glucose-Capillary: 132 mg/dL — ABNORMAL HIGH (ref 70–99)
Glucose-Capillary: 114 mg/dL — ABNORMAL HIGH (ref 70–99)

## 2019-03-27 LAB — URINALYSIS, ROUTINE W REFLEX MICROSCOPIC
Bilirubin Urine: NEGATIVE
Glucose, UA: 50 mg/dL — AB
Ketones, ur: NEGATIVE mg/dL
Leukocytes,Ua: NEGATIVE
Nitrite: NEGATIVE
Protein, ur: 100 mg/dL — AB
Specific Gravity, Urine: 1.027 (ref 1.005–1.030)
pH: 5 (ref 5.0–8.0)

## 2019-03-27 LAB — TACROLIMUS LEVEL: Tacrolimus (FK506) - LabCorp: 4.1 ng/mL (ref 2.0–20.0)

## 2019-03-27 LAB — COMPREHENSIVE METABOLIC PANEL
ALT: 33 U/L (ref 0–44)
AST: 90 U/L — ABNORMAL HIGH (ref 15–41)
Albumin: 2.3 g/dL — ABNORMAL LOW (ref 3.5–5.0)
Alkaline Phosphatase: 124 U/L (ref 38–126)
Anion gap: 10 (ref 5–15)
BUN: 36 mg/dL — ABNORMAL HIGH (ref 6–20)
CO2: 19 mmol/L — ABNORMAL LOW (ref 22–32)
Calcium: 7 mg/dL — ABNORMAL LOW (ref 8.9–10.3)
Chloride: 117 mmol/L — ABNORMAL HIGH (ref 98–111)
Creatinine, Ser: 4.65 mg/dL — ABNORMAL HIGH (ref 0.61–1.24)
GFR calc Af Amer: 15 mL/min — ABNORMAL LOW (ref >60)
GFR calc non Af Amer: 13 mL/min — ABNORMAL LOW (ref >60)
Glucose, Bld: 246 mg/dL — ABNORMAL HIGH (ref 70–99)
Potassium: 4 mmol/L (ref 3.5–5.1)
Sodium: 146 mmol/L — ABNORMAL HIGH (ref 135–145)
Total Bilirubin: 1.1 mg/dL (ref 0.3–1.2)
Total Protein: 5 g/dL — ABNORMAL LOW (ref 6.5–8.1)

## 2019-03-27 LAB — POCT I-STAT 7, (LYTES, BLD GAS, ICA,H+H)
Acid-base deficit: 6 mmol/L — ABNORMAL HIGH (ref 0.0–2.0)
Bicarbonate: 19.4 mmol/L — ABNORMAL LOW (ref 20.0–28.0)
Calcium, Ion: 1 mmol/L — ABNORMAL LOW (ref 1.15–1.40)
HCT: 20 % — ABNORMAL LOW (ref 39.0–52.0)
Hemoglobin: 6.8 g/dL — CL (ref 13.0–17.0)
O2 Saturation: 95 %
Patient temperature: 101.7
Potassium: 4 mmol/L (ref 3.5–5.1)
Sodium: 149 mmol/L — ABNORMAL HIGH (ref 135–145)
TCO2: 20 mmol/L — ABNORMAL LOW (ref 22–32)
pCO2 arterial: 37.8 mmHg (ref 32.0–48.0)
pH, Arterial: 7.325 — ABNORMAL LOW (ref 7.350–7.450)
pO2, Arterial: 88 mmHg (ref 83.0–108.0)

## 2019-03-27 MED ORDER — GLYCOPYRROLATE 1 MG PO TABS: 1.0000 mg | ORAL_TABLET | ORAL | Status: DC | PRN

## 2019-03-27 MED ORDER — MORPHINE BOLUS VIA INFUSION
5.0000 mg | INTRAVENOUS | Status: DC | PRN
  Filled 2019-03-27: qty 5

## 2019-03-27 MED ORDER — DEXTROSE 5 % IV SOLN: INTRAVENOUS | Status: DC

## 2019-03-27 MED ORDER — MORPHINE 100MG IN NS 100ML (1MG/ML) PREMIX INFUSION: 0.0000 mg/h | INTRAVENOUS | Status: DC

## 2019-03-27 MED ORDER — LACTATED RINGERS IV BOLUS
1000.0000 mL | Freq: Once | INTRAVENOUS | Status: AC
  Administered 2019-03-27: 1000 mL via INTRAVENOUS

## 2019-03-27 MED ORDER — ACETAMINOPHEN 650 MG RE SUPP: 650.0000 mg | Freq: Four times a day (QID) | RECTAL | Status: DC | PRN

## 2019-03-27 MED ORDER — POLYVINYL ALCOHOL 1.4 % OP SOLN: 1.0000 [drp] | Freq: Four times a day (QID) | OPHTHALMIC | Status: DC | PRN

## 2019-03-27 MED ORDER — DIPHENHYDRAMINE HCL 50 MG/ML IJ SOLN: 25.0000 mg | INTRAMUSCULAR | Status: DC | PRN

## 2019-03-27 MED ORDER — GLYCOPYRROLATE 0.2 MG/ML IJ SOLN: 0.2000 mg | INTRAMUSCULAR | Status: DC | PRN

## 2019-03-27 MED ORDER — MORPHINE SULFATE (PF) 2 MG/ML IV SOLN: 2.0000 mg | INTRAVENOUS | Status: DC | PRN

## 2019-03-27 MED ORDER — ACETAMINOPHEN 325 MG PO TABS: 650.0000 mg | ORAL_TABLET | Freq: Four times a day (QID) | ORAL | Status: DC | PRN

## 2019-03-27 MED ORDER — LORAZEPAM 2 MG/ML IJ SOLN: 2.0000 mg | INTRAMUSCULAR | Status: DC | PRN

## 2019-03-28 ENCOUNTER — Encounter (HOSPITAL_COMMUNITY): Payer: Self-pay | Admitting: General Practice

## 2019-03-30 ENCOUNTER — Ambulatory Visit: Payer: Medicare Other | Admitting: Hematology and Oncology

## 2019-03-31 LAB — CULTURE, BLOOD (ROUTINE X 2)
Culture: NO GROWTH
Culture: NO GROWTH
Special Requests: ADEQUATE
Special Requests: ADEQUATE

## 2019-04-02 ENCOUNTER — Ambulatory Visit: Payer: Medicare Other

## 2019-04-02 ENCOUNTER — Other Ambulatory Visit: Payer: Medicare Other

## 2019-04-24 NOTE — Procedures (Signed)
Extubation Procedure Note  Patient Details:   Name: Austin Murphy DOB: Dec 05, 1965 MRN: [id number]   Airway Documentation:    Vent end date: Mar 29, 2019 Vent end time: 5929   Evaluation  O2 sats: stable throughout Complications: No apparent complications Patient did tolerate procedure well. Bilateral Breath Sounds: Rhonchi   No   Pt was extubated per MD order.   Ronaldo Miyamoto 03/29/19, 5:18 PM
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
## 2019-04-24 NOTE — Progress Notes (Signed)
Nutrition Brief Note  Chart reviewed. Pt is pending organ procurement. DNR No further nutrition interventions warranted at this time.  Please re-consult as needed.   [employer] MS, RDN, LDN, CNSC [phone] Pager  [phone] Weekend/On-Call Pager
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
## 2019-04-24 NOTE — Progress Notes (Signed)
1729- patient noted to be asystole and apneic. Time of death verified with Sarah RN.  [phone]- ME notified of time of death.  [place] notified of brothers passing.  45- Dr Grandville Silos notified of time of death.  1742- [facility] notified of time of death.   Lianne Bushy RN BSN

## 2019-04-24 NOTE — Progress Notes (Signed)
PALLIATIVE NOTE:   Chart Reviewed and updates received by RN. Patient assessed. No distress.  Patient is pending organ donation. No needs at this time. No family is at the bedside, but have been calling and updated.   Palliative support available if needed.   Austin Murphy, AGPCNP-BC Palliative Medicine Team   NO CHARGE

## 2019-04-24 NOTE — Progress Notes (Signed)
This nurse notified at 1700 patient is no longer an organ donor candidate. Family wishes to proceed with comfort care. Dr. Grandville Silos made aware of change of status. New orders received and carried out. Lianne Bushy RN BSN .

## 2019-04-24 DEATH — deceased

## 2019-05-02 ENCOUNTER — Other Ambulatory Visit: Payer: Medicare Other

## 2019-05-02 ENCOUNTER — Ambulatory Visit: Payer: Medicare Other

## 2019-08-11 IMAGING — CT CT BIOPSY BONE MARROW
1 of 2 series · 15 of 32 positions shown, 19 images · non-contrast
Comparison: none

INDICATION: 52-year-old with symptomatic anemia.  History of renal transplant.

[Series 2: i-spiral 5.0 b40f · axial · 0.66mm/px · z∈[+844,+963]mm · 15 of 38 slices shown, 19 images]
[im 2/38  soft-tissue]
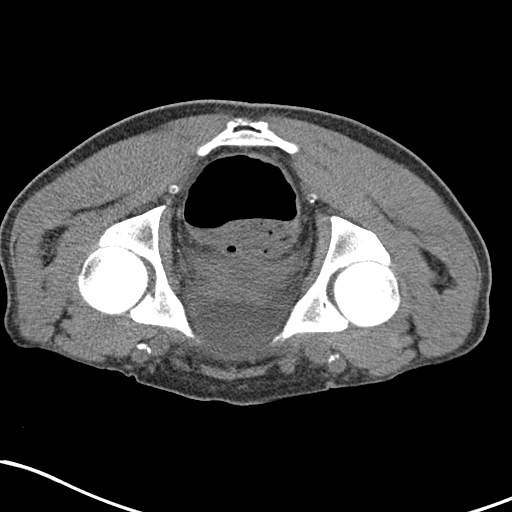
[im 2/38  bone]
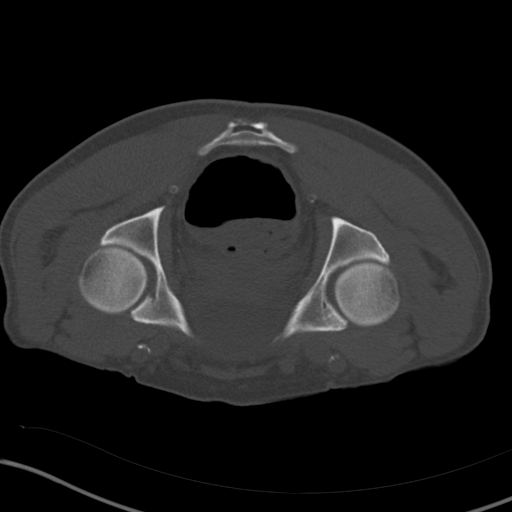
[im 5/38  soft-tissue]
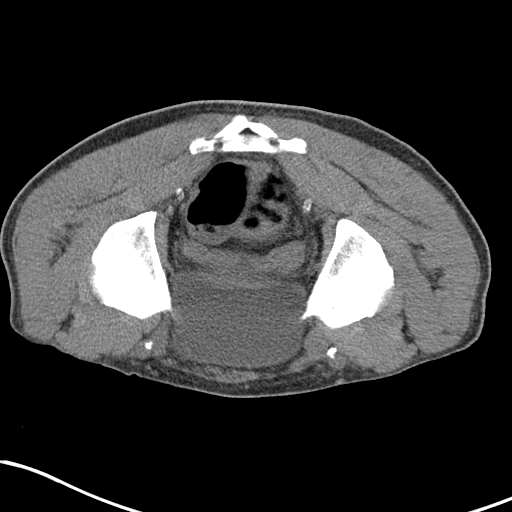
[im 9/38  soft-tissue]
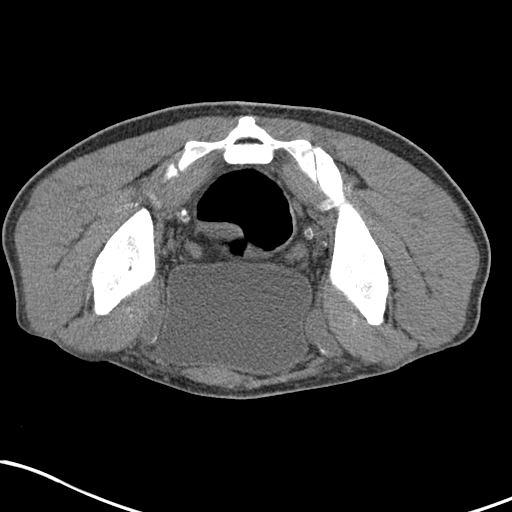
[im 10/38  soft-tissue]
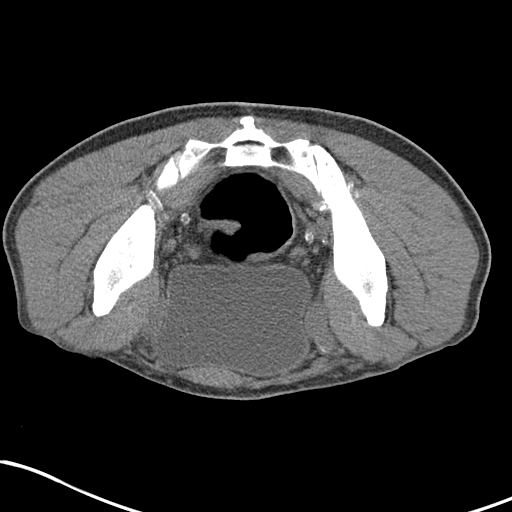
[im 13/38  soft-tissue]
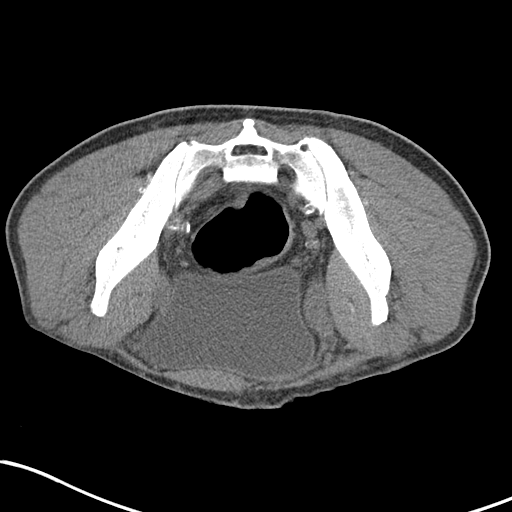
[im 17/38  soft-tissue]
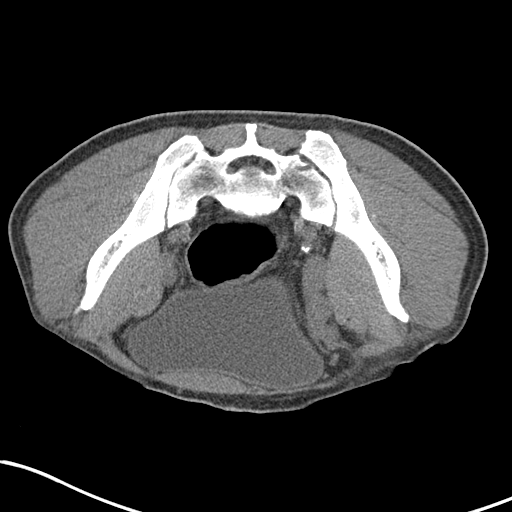
[im 20/38  soft-tissue]
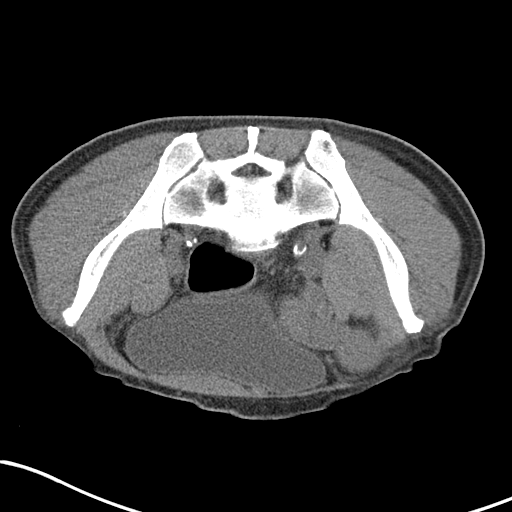
[im 21/38  soft-tissue]
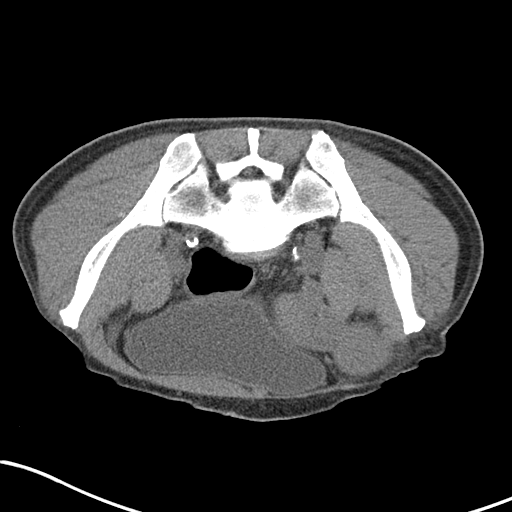
[im 25/38  soft-tissue]
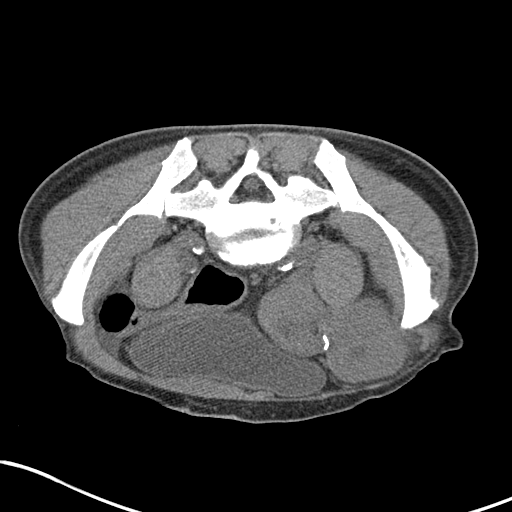
[im 25/38  bone]
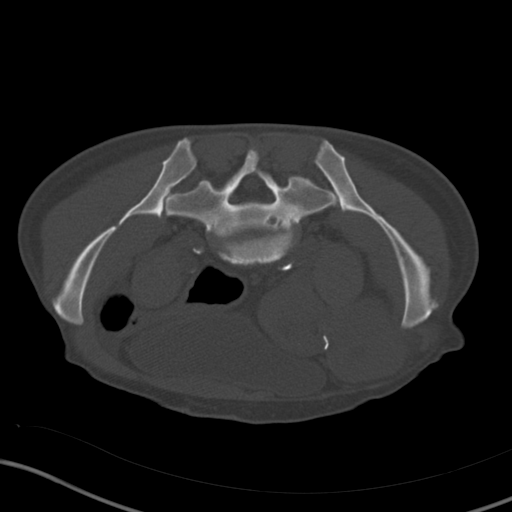
[im 28/38  soft-tissue]
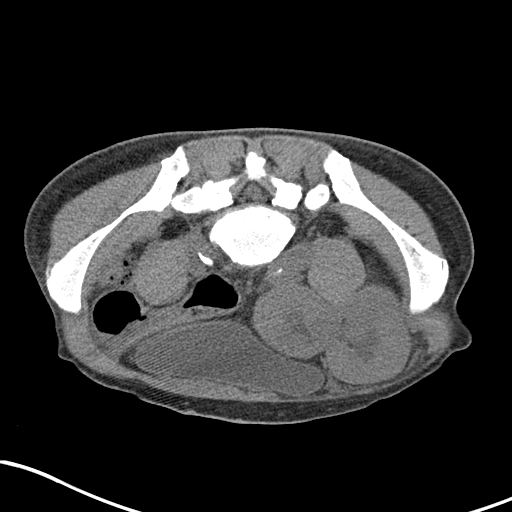
[im 29/38  soft-tissue]
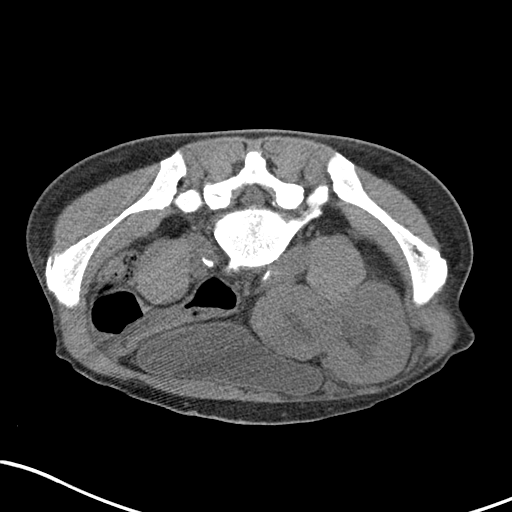
[im 31/38  lung]
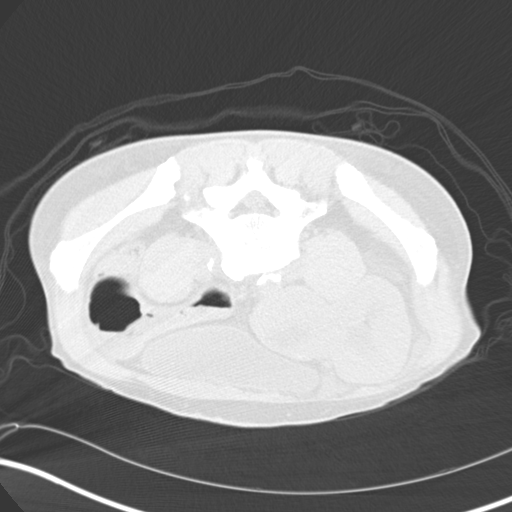
[im 33/38  soft-tissue]
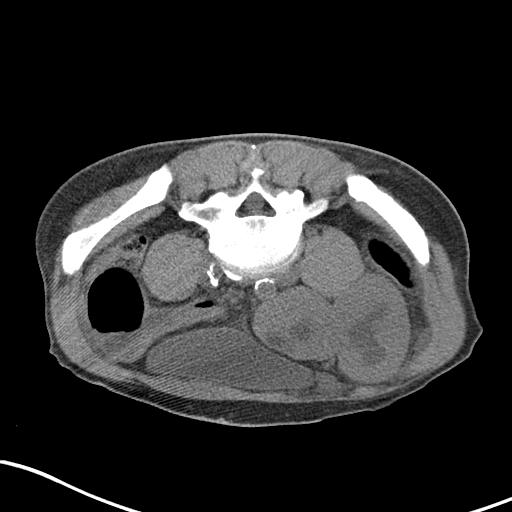
[im 33/38  lung]
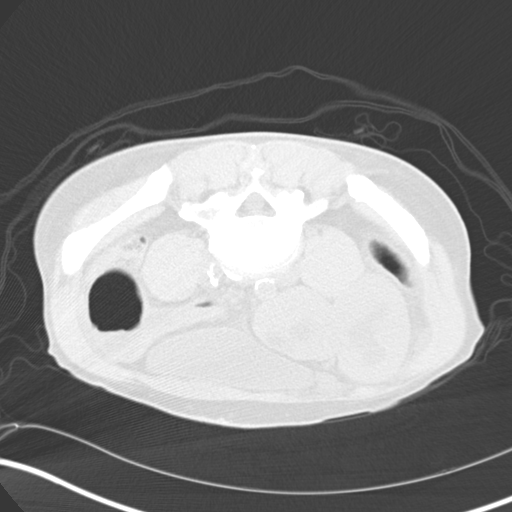
[im 34/38  lung]
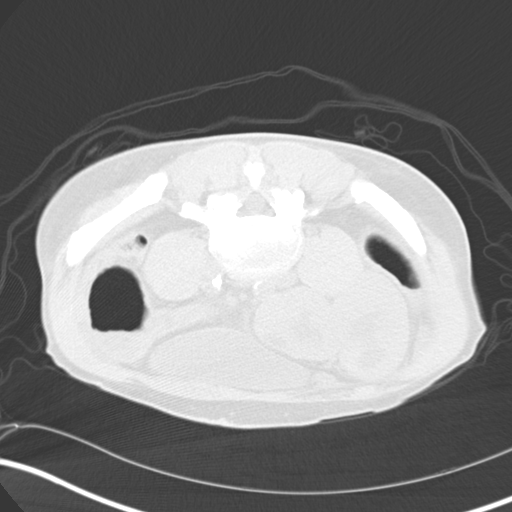
[im 36/38  soft-tissue]
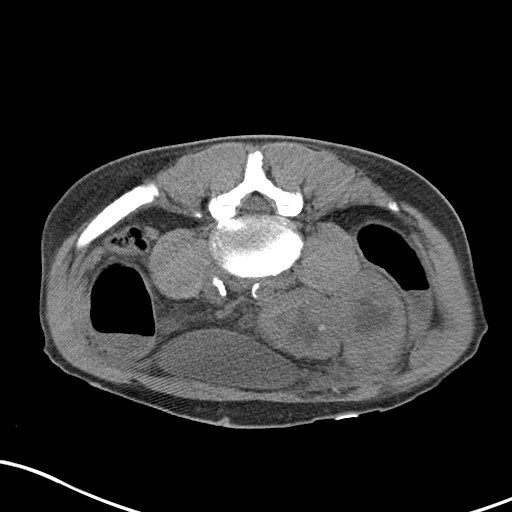
[im 36/38  lung]
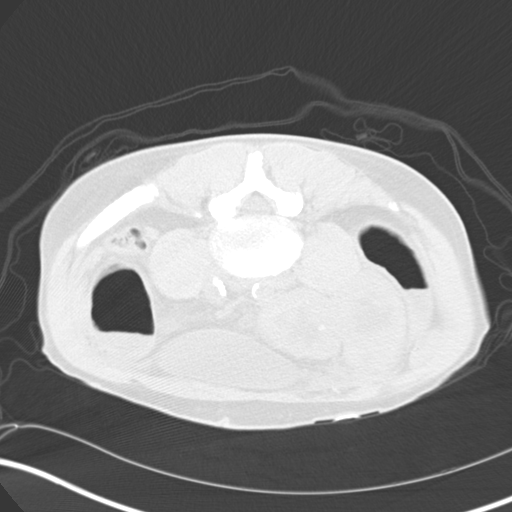

[15 of 32 positions shown; findings below may reference images not displayed]

EXAM:
CT GUIDED BONE MARROW ASPIRATES AND BIOPSY

MEDICATIONS:
None.

ANESTHESIA/SEDATION:
Fentanyl 75 mcg IV; Versed 1.5 mg IV

Moderate Sedation Time:  20 minutes

The patient was continuously monitored during the procedure by the
interventional radiology nurse under my direct supervision.

COMPLICATIONS:
None immediate.

PROCEDURE:
The procedure was explained to the patient. The risks and benefits
of the procedure were discussed and the patient's questions were
addressed. Informed consent was obtained from the patient. The
patient was placed prone on CT table. Images of the pelvis were
obtained. The right side of back was prepped and draped in sterile
fashion. The skin and right posterior ilium were anesthetized with
1% lidocaine. 11 gauge bone needle was directed into the right ilium
with CT guidance. Two aspirates and two core biopsies were obtained.
Bandage placed over the puncture site.
FINDINGS: Needle was directed into the posterior right ilium. Adequate
specimens obtained.

There appears to be two transplant kidneys in the left lower
quadrant with dilatation of the renal collecting systems. There was
mild hydronephrosis on the ultrasound from 07/05/2017.
IMPRESSION: CT guided bone marrow aspiration and core biopsy.

Renal transplant with hydronephrosis. Difficult to compare the
degree of hydronephrosis compared to the previous renal ultrasound
from 07/05/2017. This could be further characterized with a
dedicated renal ultrasound if needed.

These results will be called to the ordering clinician or
representative by the Radiologist Assistant, and communication
documented in the PACS or zVision Dashboard.
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
## 2022-09-29 ENCOUNTER — Other Ambulatory Visit: Payer: Self-pay | Admitting: Hematology and Oncology
